# Patient Record
Sex: Male | Born: 1955
Health system: Southern US, Community
[De-identification: ages and names within clinical notes are randomized; demographics above are authoritative.]

## PROBLEM LIST (undated history)

## (undated) VITALS — BP 148/90 | HR 94 | Temp 98.0°F | Resp 17 | Ht 69.0 in | Wt 206.0 lb

## (undated) DIAGNOSIS — R21 Rash and other nonspecific skin eruption: Secondary | ICD-10-CM

## (undated) DIAGNOSIS — D72829 Elevated white blood cell count, unspecified: Secondary | ICD-10-CM

## (undated) DIAGNOSIS — E785 Hyperlipidemia, unspecified: Secondary | ICD-10-CM

## (undated) DIAGNOSIS — F329 Major depressive disorder, single episode, unspecified: Secondary | ICD-10-CM

## (undated) DIAGNOSIS — F32A Depression, unspecified: Secondary | ICD-10-CM

## (undated) DIAGNOSIS — G4700 Insomnia, unspecified: Secondary | ICD-10-CM

## (undated) DIAGNOSIS — F319 Bipolar disorder, unspecified: Secondary | ICD-10-CM

## (undated) DIAGNOSIS — K289 Gastrojejunal ulcer, unspecified as acute or chronic, without hemorrhage or perforation: Secondary | ICD-10-CM

## (undated) DIAGNOSIS — IMO0002 Reserved for concepts with insufficient information to code with codable children: Secondary | ICD-10-CM

## (undated) HISTORY — DX: Bipolar disorder, unspecified: F31.9

## (undated) HISTORY — PX: CERVICAL SPINE SURGERY: SHX589

## (undated) HISTORY — PX: BACK SURGERY: SHX140

## (undated) HISTORY — PX: UPPER GASTROINTESTINAL ENDOSCOPY: SHX188

## (undated) HISTORY — PX: CHOLECYSTECTOMY: SHX55

## (undated) HISTORY — DX: Insomnia, unspecified: G47.00

## (undated) HISTORY — DX: Hyperlipidemia, unspecified: E78.5

## (undated) HISTORY — DX: Gastrojejunal ulcer, unspecified as acute or chronic, without hemorrhage or perforation: K28.9

## (undated) HISTORY — DX: Reserved for concepts with insufficient information to code with codable children: IMO0002

## (undated) HISTORY — PX: LUMBAR SPINE SURGERY: SHX701

## (undated) HISTORY — DX: Elevated white blood cell count, unspecified: D72.829

## (undated) HISTORY — DX: Rash and other nonspecific skin eruption: R21

---

## 2000-02-21 ENCOUNTER — Encounter: Payer: Self-pay | Admitting: Neurosurgery

## 2000-02-23 ENCOUNTER — Encounter: Payer: Self-pay | Admitting: Neurosurgery

## 2000-02-23 ENCOUNTER — Inpatient Hospital Stay (HOSPITAL_COMMUNITY): Admission: RE | Admit: 2000-02-23 | Discharge: 2000-02-24 | Payer: Self-pay | Admitting: Neurosurgery

## 2002-06-16 ENCOUNTER — Encounter: Payer: Self-pay | Admitting: Neurosurgery

## 2002-06-16 ENCOUNTER — Encounter: Admission: RE | Admit: 2002-06-16 | Discharge: 2002-06-16 | Payer: Self-pay | Admitting: Neurosurgery

## 2002-06-29 ENCOUNTER — Encounter: Payer: Self-pay | Admitting: Neurosurgery

## 2002-06-29 ENCOUNTER — Encounter: Admission: RE | Admit: 2002-06-29 | Discharge: 2002-06-29 | Payer: Self-pay | Admitting: Neurosurgery

## 2002-08-12 ENCOUNTER — Encounter: Payer: Self-pay | Admitting: Neurosurgery

## 2002-08-14 ENCOUNTER — Inpatient Hospital Stay (HOSPITAL_COMMUNITY): Admission: RE | Admit: 2002-08-14 | Discharge: 2002-08-15 | Payer: Self-pay | Admitting: Neurosurgery

## 2002-08-14 ENCOUNTER — Encounter: Payer: Self-pay | Admitting: Neurosurgery

## 2003-01-12 ENCOUNTER — Encounter: Payer: Self-pay | Admitting: Neurosurgery

## 2003-01-12 ENCOUNTER — Inpatient Hospital Stay (HOSPITAL_COMMUNITY): Admission: RE | Admit: 2003-01-12 | Discharge: 2003-01-14 | Payer: Self-pay | Admitting: Neurosurgery

## 2003-05-06 ENCOUNTER — Inpatient Hospital Stay (HOSPITAL_COMMUNITY): Admission: RE | Admit: 2003-05-06 | Discharge: 2003-05-11 | Payer: Self-pay | Admitting: Neurosurgery

## 2003-05-06 ENCOUNTER — Encounter: Payer: Self-pay | Admitting: Neurosurgery

## 2003-12-12 ENCOUNTER — Emergency Department (HOSPITAL_COMMUNITY): Admission: EM | Admit: 2003-12-12 | Discharge: 2003-12-13 | Payer: Self-pay | Admitting: Family Medicine

## 2003-12-29 ENCOUNTER — Emergency Department (HOSPITAL_COMMUNITY): Admission: EM | Admit: 2003-12-29 | Discharge: 2003-12-29 | Payer: Self-pay | Admitting: Emergency Medicine

## 2003-12-31 ENCOUNTER — Ambulatory Visit (HOSPITAL_COMMUNITY): Admission: RE | Admit: 2003-12-31 | Discharge: 2003-12-31 | Payer: Self-pay | Admitting: Internal Medicine

## 2004-01-04 ENCOUNTER — Ambulatory Visit (HOSPITAL_COMMUNITY): Admission: RE | Admit: 2004-01-04 | Discharge: 2004-01-04 | Payer: Self-pay | Admitting: Internal Medicine

## 2004-01-08 ENCOUNTER — Inpatient Hospital Stay (HOSPITAL_COMMUNITY): Admission: RE | Admit: 2004-01-08 | Discharge: 2004-01-10 | Payer: Self-pay | Admitting: Internal Medicine

## 2004-01-24 ENCOUNTER — Ambulatory Visit (HOSPITAL_COMMUNITY): Admission: RE | Admit: 2004-01-24 | Discharge: 2004-01-24 | Payer: Self-pay | Admitting: Internal Medicine

## 2004-02-05 ENCOUNTER — Emergency Department (HOSPITAL_COMMUNITY): Admission: EM | Admit: 2004-02-05 | Discharge: 2004-02-05 | Payer: Self-pay | Admitting: *Deleted

## 2004-04-07 ENCOUNTER — Ambulatory Visit (HOSPITAL_COMMUNITY): Admission: RE | Admit: 2004-04-07 | Discharge: 2004-04-07 | Payer: Self-pay | Admitting: Internal Medicine

## 2004-09-14 ENCOUNTER — Ambulatory Visit: Payer: Self-pay | Admitting: Internal Medicine

## 2004-09-24 HISTORY — PX: COLONOSCOPY: SHX174

## 2005-01-18 ENCOUNTER — Ambulatory Visit: Payer: Self-pay | Admitting: Internal Medicine

## 2005-01-22 ENCOUNTER — Ambulatory Visit (HOSPITAL_COMMUNITY): Admission: RE | Admit: 2005-01-22 | Discharge: 2005-01-22 | Payer: Self-pay | Admitting: Internal Medicine

## 2005-02-19 ENCOUNTER — Emergency Department (HOSPITAL_COMMUNITY): Admission: EM | Admit: 2005-02-19 | Discharge: 2005-02-19 | Payer: Self-pay | Admitting: Emergency Medicine

## 2005-03-28 ENCOUNTER — Emergency Department (HOSPITAL_COMMUNITY): Admission: EM | Admit: 2005-03-28 | Discharge: 2005-03-28 | Payer: Self-pay | Admitting: Emergency Medicine

## 2005-06-15 ENCOUNTER — Ambulatory Visit: Payer: Self-pay | Admitting: Gastroenterology

## 2005-06-15 ENCOUNTER — Ambulatory Visit (HOSPITAL_COMMUNITY): Admission: RE | Admit: 2005-06-15 | Discharge: 2005-06-15 | Payer: Self-pay | Admitting: Internal Medicine

## 2005-06-21 ENCOUNTER — Ambulatory Visit (HOSPITAL_COMMUNITY): Admission: RE | Admit: 2005-06-21 | Discharge: 2005-06-21 | Payer: Self-pay | Admitting: Internal Medicine

## 2005-06-26 ENCOUNTER — Ambulatory Visit: Payer: Self-pay | Admitting: Internal Medicine

## 2005-08-22 ENCOUNTER — Ambulatory Visit (HOSPITAL_COMMUNITY): Admission: RE | Admit: 2005-08-22 | Discharge: 2005-08-22 | Payer: Self-pay | Admitting: General Surgery

## 2005-08-22 ENCOUNTER — Encounter (INDEPENDENT_AMBULATORY_CARE_PROVIDER_SITE_OTHER): Payer: Self-pay | Admitting: General Surgery

## 2005-11-21 ENCOUNTER — Ambulatory Visit: Payer: Self-pay | Admitting: Internal Medicine

## 2006-02-27 ENCOUNTER — Ambulatory Visit: Payer: Self-pay | Admitting: Internal Medicine

## 2006-03-29 ENCOUNTER — Ambulatory Visit (HOSPITAL_COMMUNITY): Admission: RE | Admit: 2006-03-29 | Discharge: 2006-03-29 | Payer: Self-pay | Admitting: Gastroenterology

## 2007-01-09 ENCOUNTER — Ambulatory Visit (HOSPITAL_COMMUNITY): Admission: RE | Admit: 2007-01-09 | Discharge: 2007-01-09 | Payer: Self-pay | Admitting: Gastroenterology

## 2007-06-19 ENCOUNTER — Ambulatory Visit: Payer: Self-pay | Admitting: Gastroenterology

## 2007-07-12 ENCOUNTER — Emergency Department (HOSPITAL_COMMUNITY): Admission: EM | Admit: 2007-07-12 | Discharge: 2007-07-12 | Payer: Self-pay | Admitting: Emergency Medicine

## 2007-11-04 ENCOUNTER — Ambulatory Visit: Payer: Self-pay | Admitting: Gastroenterology

## 2007-12-20 ENCOUNTER — Emergency Department (HOSPITAL_COMMUNITY): Admission: EM | Admit: 2007-12-20 | Discharge: 2007-12-20 | Payer: Self-pay | Admitting: Emergency Medicine

## 2008-02-04 ENCOUNTER — Ambulatory Visit: Payer: Self-pay | Admitting: Gastroenterology

## 2008-02-11 ENCOUNTER — Ambulatory Visit: Payer: Self-pay | Admitting: Gastroenterology

## 2008-02-11 ENCOUNTER — Ambulatory Visit (HOSPITAL_COMMUNITY): Admission: RE | Admit: 2008-02-11 | Discharge: 2008-02-11 | Payer: Self-pay | Admitting: Gastroenterology

## 2008-02-22 ENCOUNTER — Emergency Department (HOSPITAL_COMMUNITY): Admission: EM | Admit: 2008-02-22 | Discharge: 2008-02-22 | Payer: Self-pay | Admitting: Emergency Medicine

## 2008-11-22 ENCOUNTER — Emergency Department (HOSPITAL_COMMUNITY): Admission: EM | Admit: 2008-11-22 | Discharge: 2008-11-22 | Payer: Self-pay | Admitting: Emergency Medicine

## 2009-06-10 ENCOUNTER — Encounter (INDEPENDENT_AMBULATORY_CARE_PROVIDER_SITE_OTHER): Payer: Self-pay | Admitting: *Deleted

## 2009-06-10 LAB — CONVERTED CEMR LAB
CO2: 23 meq/L
Calcium: 9.3 mg/dL
Chloride: 106 meq/L
Creatinine, Ser: 1.02 mg/dL
Glucose, Bld: 83 mg/dL
Hemoglobin: 14.7 g/dL
WBC: 10.5 10*3/uL

## 2009-10-12 ENCOUNTER — Ambulatory Visit (HOSPITAL_COMMUNITY): Admission: RE | Admit: 2009-10-12 | Discharge: 2009-10-12 | Payer: Self-pay | Admitting: Neurology

## 2009-12-19 ENCOUNTER — Encounter (INDEPENDENT_AMBULATORY_CARE_PROVIDER_SITE_OTHER): Payer: Self-pay | Admitting: *Deleted

## 2009-12-19 ENCOUNTER — Inpatient Hospital Stay (HOSPITAL_COMMUNITY): Admission: EM | Admit: 2009-12-19 | Discharge: 2009-12-21 | Payer: Self-pay | Admitting: Emergency Medicine

## 2009-12-19 ENCOUNTER — Ambulatory Visit: Payer: Self-pay | Admitting: Cardiology

## 2009-12-19 LAB — CONVERTED CEMR LAB: Free T4: 1.28 ng/dL

## 2009-12-20 ENCOUNTER — Encounter (INDEPENDENT_AMBULATORY_CARE_PROVIDER_SITE_OTHER): Payer: Self-pay | Admitting: Internal Medicine

## 2009-12-29 ENCOUNTER — Encounter (HOSPITAL_COMMUNITY): Admission: RE | Admit: 2009-12-29 | Discharge: 2010-01-28 | Payer: Self-pay | Admitting: Cardiology

## 2009-12-29 ENCOUNTER — Ambulatory Visit: Payer: Self-pay | Admitting: Cardiology

## 2010-02-08 DIAGNOSIS — F411 Generalized anxiety disorder: Secondary | ICD-10-CM | POA: Insufficient documentation

## 2010-02-08 DIAGNOSIS — F325 Major depressive disorder, single episode, in full remission: Secondary | ICD-10-CM | POA: Insufficient documentation

## 2010-02-08 DIAGNOSIS — F329 Major depressive disorder, single episode, unspecified: Secondary | ICD-10-CM

## 2010-02-08 DIAGNOSIS — F410 Panic disorder [episodic paroxysmal anxiety] without agoraphobia: Secondary | ICD-10-CM | POA: Insufficient documentation

## 2010-02-08 DIAGNOSIS — Z720 Tobacco use: Secondary | ICD-10-CM

## 2010-02-08 DIAGNOSIS — IMO0002 Reserved for concepts with insufficient information to code with codable children: Secondary | ICD-10-CM

## 2010-02-08 DIAGNOSIS — Z87891 Personal history of nicotine dependence: Secondary | ICD-10-CM | POA: Insufficient documentation

## 2010-02-08 DIAGNOSIS — Z8719 Personal history of other diseases of the digestive system: Secondary | ICD-10-CM | POA: Insufficient documentation

## 2010-02-09 ENCOUNTER — Encounter (INDEPENDENT_AMBULATORY_CARE_PROVIDER_SITE_OTHER): Payer: Self-pay | Admitting: *Deleted

## 2010-02-10 ENCOUNTER — Ambulatory Visit: Payer: Self-pay | Admitting: Cardiology

## 2010-02-10 ENCOUNTER — Encounter (INDEPENDENT_AMBULATORY_CARE_PROVIDER_SITE_OTHER): Payer: Self-pay | Admitting: *Deleted

## 2010-06-13 ENCOUNTER — Encounter (INDEPENDENT_AMBULATORY_CARE_PROVIDER_SITE_OTHER): Payer: Self-pay | Admitting: *Deleted

## 2010-07-04 ENCOUNTER — Encounter: Payer: Self-pay | Admitting: Cardiology

## 2010-07-05 ENCOUNTER — Ambulatory Visit: Payer: Self-pay | Admitting: Cardiology

## 2010-07-05 DIAGNOSIS — E785 Hyperlipidemia, unspecified: Secondary | ICD-10-CM | POA: Insufficient documentation

## 2010-07-13 ENCOUNTER — Encounter (INDEPENDENT_AMBULATORY_CARE_PROVIDER_SITE_OTHER): Payer: Self-pay | Admitting: *Deleted

## 2010-07-13 LAB — CONVERTED CEMR LAB
AST: 23 units/L (ref 0–37)
Albumin: 4.1 g/dL (ref 3.5–5.2)
BUN: 8 mg/dL (ref 6–23)
Calcium: 9.3 mg/dL (ref 8.4–10.5)
Chloride: 104 meq/L (ref 96–112)
Glucose, Bld: 124 mg/dL — ABNORMAL HIGH (ref 70–99)
HDL: 30 mg/dL — ABNORMAL LOW (ref >39)
Potassium: 4.3 meq/L (ref 3.5–5.3)
Sodium: 139 meq/L (ref 135–145)
TSH: 1.237 microintl units/mL (ref 0.350–4.500)
Total Protein: 6.7 g/dL (ref 6.0–8.3)

## 2010-10-15 ENCOUNTER — Encounter (INDEPENDENT_AMBULATORY_CARE_PROVIDER_SITE_OTHER): Payer: Self-pay | Admitting: Internal Medicine

## 2010-10-24 NOTE — Assessment & Plan Note (Signed)
Summary: F4M   Visit Type:  Follow-up Primary Provider:  Dr. Sherryll Burger   History of Present Illness: Stephen Porter returns to the office for continuing assessment and treatment of hyperlipidemia.  Since his last visit, he has done generally well.  He has experienced no chest discomfort, but has chronic dyspnea on moderate exertion that is unchanged.  He has very gradually planned to discontinue cigarette smoking and is now in possession of patches with a quit date only a few days in the future.  His primary care physician advised him to discontinue the Niaspan, which I had started.   Current Medications (verified): 1)  Ultram 50 Mg Tabs (Tramadol Hcl) .... Use As Directed 2)  Pristiq 50 Mg Xr24h-Tab (Desvenlafaxine Succinate) .... Take 1 Tab Daily 3)  Simvastatin 20 Mg Tabs (Simvastatin) .... Take 1 Tab Daily 4)  Alprazolam 1 Mg Tabs (Alprazolam) .... 1/2 - 1 Tablet At Bedtime As Needed Sleep 5)  Niaspan 500 Mg Cr-Tabs (Niacin (Antihyperlipidemic)) .... Take One Tablet By Mouth Daily Titrating To 1500mg  Daily  Allergies (verified): No Known Drug Allergies  Comments:  Nurse/Medical Assistant: no meds no list reviewed previous ov med list patient states meds are correct  Past History:  PMH, FH, and Social History reviewed and updated.  Past Medical History: Chest pain-negative stress nuclear study in 4/11 TOBACCO ABUSE (ICD-305.1)-50 pack years; 1.5 packs per day Hyperlipidemia-moderately elevated triglycerides; low HDL; elevated non-HDL cholesterol ANXIETY & DEPRESSION (ICD-311) Insomnia DEGENERATIVE DISC DISEASE (ICD-722.6) History of jejunal ulceration  Past Surgical History: Lumbosacral spine surgery C-Spine surgery x3 Cholecystectomy Colonoscopy-2006  Review of Systems       See HPI.  Vital Signs:  Patient profile:   55 year old male Weight:      206 pounds BMI:     29.66 Pulse rate:   80 / minute BP sitting:   137 / 84  (right arm)  Vitals Entered By: Dreama Saa, CNA (July 05, 2010 1:41 PM)  Physical Exam  General:  somewhat tense; well developed; no acute distress:   Neck-No JVD; no carotid bruits: Lungs-No tachypnea, no rales; no rhonchi; no wheezes: Cardiovascular-normal PMI; normal S1 and S2: Abdomen-BS normal; soft and non-tender without masses or organomegaly:  Musculoskeletal-No deformities, no cyanosis or clubbing: Skin-Warm, no significant lesions: Extremities-Nl distal pulses; no edema:     Impression & Recommendations:  Problem # 1:  HYPERLIPIDEMIA (ICD-272.4) Lipid profile remains markedly suboptimal with non-HDL cholesterol in excess of 230.  I doubt that 40 mg of simvastatin will be adequate-atorvastatin or rosuvastatin should be substituted at their highest doses.  He will also require a triglyceride lowering agent, either niacin or fish oil since fenofibrate was ineffective.  I will leave management of this problem to Dr. Sherryll Burger.  CHOL: 279 (07/04/2010)   HDL: 30 (07/04/2010)   TG: 729 (07/04/2010)  TSH-1.2  Hepatic profile-normal  Problem # 2:  TOBACCO ABUSE (ICD-305.1) Patient congratulated on progress to date and advised that failure is extremely common.  He is encouraged to make multiple attempts if necessary and to contact us for referral to a formal program if his own efforts proved fruitless.  No ongoing cardiology care is necessarily warranted, although I would be happy to manage hyperlipidemia if Dr. Sherryll Burger so desires.  Unless I hear from him or from the patient, I will be available to see Stephen Porter in the future at any time that I can provide assistance.  Other Orders: Future Orders: T-Lipid Profile ([id number]) ... 08/07/2010  Patient Instructions: 1)  Your physician recommends that you schedule a follow-up appointment in: as needed  2)  Your physician recommends that you continue on your current medications as directed. Please refer to the Current Medication list given to you  today. Prescriptions: LIPITOR 40 MG TABS (ATORVASTATIN CALCIUM) Take 1 tablet by mouth once a day  #30 x 3   Entered by:   Teressa Lower RN   Authorized by:   Kathlen Brunswick, MD, [facility]   Signed by:   Teressa Lower RN on 07/13/2010   Method used:   Electronically to        [facility]* (retail)       [address]       [place], [place]  [address]       Ph: [phone]       Fax: [phone]   RxID:   [id number]

## 2010-10-24 NOTE — Letter (Signed)
Summary:  [facility] Work [facility], agricultural at [employer]  [address], [place] [address]   Phone: [phone]  Fax: [phone]     Feb 10, 2010 MRN: [id number]   [facility] [address] [place], [place]  [address]      YOUR LAB WORK IS DUE  April 12, 2010 _________________________________________  Please go to [facility], located across the street from [facility] on the second floor.  Hours are Monday - Friday 7am until 7:30pm         Saturday 8am until 12noon    _x_  DO NOT EAT OR DRINK AFTER MIDNIGHT EVENING PRIOR TO LABWORK  __ YOUR LABWORK IS NOT FASTING --YOU MAY EAT PRIOR TO LABWORK

## 2010-10-24 NOTE — Assessment & Plan Note (Signed)
Summary: 6 wk f/u per checkout on 4/7/myoview/tg   Visit Type:  Follow-up Primary Provider:  Dr. Sherryll Burger   History of Present Illness: Stephen Porter is seen in the office today after being referred to [place] for stress testing.  He was admitted to [facility] with presyncope and chest discomfort and cared for by the hospitalist.  I first met him in the stress lab, where a stress nuclear study was negative.  He has multiple cardiovascular risk factors, most notably hyperlipidemia and cigarette smoking.  He has never discontinued tobacco use for any significant period of time.  Chest discomfort and lightheadedness have resolved.  He is disabled, but performs some yard work without cardiopulmonary symptoms.  Patient reports insomnia over the past 6 months.  He denies anxiety or depression.  He received a brief prescription for Ambien with benefit.  Current Medications (verified): 1)  Ultram 50 Mg Tabs (Tramadol Hcl) .... Use As Directed 2)  Pristiq 50 Mg Xr24h-Tab (Desvenlafaxine Succinate) .... Take 1 Tab Daily 3)  Simvastatin 20 Mg Tabs (Simvastatin) .... Take 1 Tab Daily 4)  Alprazolam 1 Mg Tabs (Alprazolam) .... 1/2 - 1 Tablet At Bedtime As Needed Sleep 5)  Niaspan 500 Mg Cr-Tabs (Niacin (Antihyperlipidemic)) .... Take One Tablet By Mouth Daily Titrating To 1500mg  Daily  Allergies (verified): No Known Drug Allergies  Past History:  Family History: Last updated: 02/10/2010 Father and mother are alive and well Siblings-3 brothers and one sister have hypertension  Social History: Last updated: 02/10/2010 Married and lives locally; 3 children Employment-disabled Tobacco Use - 50 pack years; 1.5 packs per day Alcohol Use - no Regular Exercise - no Drug Use - no  Past Medical History: Chest pain-negative stress nuclear study in 4/11 TOBACCO ABUSE (ICD-305.1)-50 pack years; 1.5 packs per day DYSLIPIDEMIA (ICD-272.4)-elevated triglycerides ANXIETY & DEPRESSION  (ICD-311) Insomnia DEGENERATIVE DISC DISEASE (ICD-722.6) History of jejunal ulceration  Past Surgical History: Lumbosacral spine surgery C-Spine surgery x3 Cholecystectomy  Family History: Father and mother are alive and well Siblings-3 brothers and one sister have hypertension  Social History: Married and lives locally; 3 children Employment-disabled Tobacco Use - 50 pack years; 1.5 packs per day Alcohol Use - no Regular Exercise - no Drug Use - no  -  Date:  12/20/2009    Cholesterol: 216    HDL: 23    Triglycerides: 478   Review of Systems  The patient denies anorexia, fever, weight loss, weight gain, vision loss, decreased hearing, hoarseness, chest pain, syncope, dyspnea on exertion, peripheral edema, prolonged cough, headaches, hemoptysis, abdominal pain, melena, and hematochezia.    Vital Signs:  Patient profile:   55 year old male Height:      70 inches Weight:      206 pounds BMI:     29.66 Pulse rate:   84 / minute BP sitting:   115 / 63  (right arm)  Vitals Entered By: Dreama Saa, CNA (Feb 10, 2010 1:53 PM)  Physical Exam  General:    Anxious; well developed; no acute distress:   Neck-No JVD; no carotid bruits: Lungs-No tachypnea, no rales; no rhonchi; no wheezes: Cardiovascular-normal PMI; normal S1 and S2: Abdomen-BS normal; soft and non-tender without masses or organomegaly:  Musculoskeletal-No deformities, no cyanosis or clubbing: Neurologic-Normal cranial nerves; symmetric strength and tone:  Skin-Warm, no significant lesions: Extremities-Nl distal pulses; no edema:     Impression & Recommendations:  Problem # 1:  TOBACCO ABUSE (ICD-305.1) Patient reported benefit with transdermal nicotine provided to  him in the hospital.  I recommended that he obtain 21 mg patches and attempt to discontinue cigarette smoking.  He has experienced a previous adverse affect to Chantix.  If he is unsuccessful, we will refer him to a smoking cessation  program.  Problem # 2:  DYSLIPIDEMIA (ICD-272.4) He has quite high triglycerides and low HDL without a diagnosis of diabetes.  This also could reflect hypothyroidism-TSH level will be obtained.  Niaspan will be added to his regime and a repeat lipid profile obtained in 2 months.  Fenofibrate will be discontinued for the time being.  I will plan to reassess this nice gentleman in 4 months.  At that point, I anticipate that he will not require subsequent cardiology treatment unless new issues arise.  Other Orders: Future Orders: T-Comprehensive Metabolic Panel ([id number]) ... 04/12/2010 T-Lipid Profile [id number]) ... 04/12/2010 T-TSH [phone]) ... 04/12/2010  Patient Instructions: 1)  Your physician recommends that you schedule a follow-up appointment in: 4 months 2)  Your physician recommends that you return for lab work in: 2 months 3)  Your physician has recommended you make the following change in your medication:  stop tricor, xanax 1mg  1/2-1 tablet at bedtime for sleep as needed 4)  niaspan 500mg  daily titrate to 1500mg  daily Prescriptions: NIASPAN 500 MG CR-TABS (NIACIN (ANTIHYPERLIPIDEMIC)) take one tablet by mouth daily titrating to 1500mg  daily  #90 x 3   Entered by:   Teressa Lower RN   Authorized by:   Kathlen Brunswick, MD, [facility]   Signed by:   Teressa Lower RN on 02/10/2010   Method used:   Electronically to        [facility]. [phone]* (retail)       [address] [place], [place]  [address]       Ph: [phone]       Fax: [phone]   RxID:   [id number] ALPRAZOLAM 1 MG TABS (ALPRAZOLAM) 1/2 - 1 tablet at bedtime as needed sleep  #30 x 0   Entered by:   Teressa Lower RN   Authorized by:   Kathlen Brunswick, MD, [facility]   Signed by:   Teressa Lower RN on 02/10/2010   Method used:   Print then Give to Patient   RxID:   [id number]

## 2010-10-24 NOTE — Miscellaneous (Signed)
Summary: LABS TSH,T4,12/19/2009  Clinical Lists Changes  Observations: Added new observation of TSH: 1.355 microintl units/mL (12/19/2009 10:40) Added new observation of T4, FREE: 1.28 ng/dL (04/54/0981 19:14)

## 2010-10-24 NOTE — Letter (Signed)
Summary: [place] [facility] Work [facility], agricultural at [employer]  [address], [place] [address]   Phone: [phone]  Fax: [phone]     July 13, 2010 MRN: [id number]   [facility] [address] [place], [place]  [address]      YOUR LAB WORK IS DUE   August 07, 2010  Please go to [facility], located across the street from [facility] on the second floor.  Hours are Monday - Friday 7am until 7:30pm         Saturday 8am until 12noon    _X_  DO NOT EAT OR DRINK AFTER MIDNIGHT EVENING PRIOR TO LABWORK

## 2010-10-24 NOTE — Miscellaneous (Signed)
Summary: LABS CBCD,BMP,TSH,06/10/2009  Clinical Lists Changes  Observations: Added new observation of CALCIUM: 9.3 mg/dL (04/54/0981 1:91) Added new observation of CREATININE: 1.02 mg/dL (47/82/9562 1:30) Added new observation of BUN: 11 mg/dL (86/57/8469 6:29) Added new observation of BG RANDOM: 83 mg/dL (52/84/1324 4:01) Added new observation of CO2 PLSM/SER: 23 meq/L (06/10/2009 8:49) Added new observation of CL SERUM: 106 meq/L (06/10/2009 8:49) Added new observation of K SERUM: 4.2 meq/L (06/10/2009 8:49) Added new observation of NA: 139 meq/L (06/10/2009 8:49) Added new observation of PLATELETK/UL: 252 K/uL (06/10/2009 8:49) Added new observation of MCV: 89.2 fL (06/10/2009 8:49) Added new observation of HCT: 44.5 % (06/10/2009 8:49) Added new observation of HGB: 14.7 g/dL (02/72/5366 4:40) Added new observation of WBC COUNT: 10.5 10*3/microliter (06/10/2009 8:49) Added new observation of TSH: 0.798 microintl units/mL (06/10/2009 8:49)

## 2010-12-17 LAB — CK TOTAL AND CKMB (NOT AT ARMC): Relative Index: INVALID (ref 0.0–2.5)

## 2010-12-17 LAB — BASIC METABOLIC PANEL
BUN: 7 mg/dL (ref 6–23)
Calcium: 8.7 mg/dL (ref 8.4–10.5)
Chloride: 102 mEq/L (ref 96–112)
GFR calc Af Amer: >60 mL/min (ref >60)
GFR calc Af Amer: >60 mL/min (ref >60)
GFR calc non Af Amer: >60 mL/min (ref >60)
GFR calc non Af Amer: >60 mL/min (ref >60)
Potassium: 3.8 mEq/L (ref 3.5–5.1)
Sodium: 132 mEq/L — ABNORMAL LOW (ref 135–145)
Sodium: 141 mEq/L (ref 135–145)

## 2010-12-17 LAB — DIFFERENTIAL
Eosinophils Absolute: 0.2 10*3/uL (ref 0.0–0.7)
Eosinophils Relative: 2 % (ref 0–5)
Lymphocytes Relative: 24 % (ref 12–46)
Lymphs Abs: 2.3 10*3/uL (ref 0.7–4.0)
Lymphs Abs: 2.6 10*3/uL (ref 0.7–4.0)
Monocytes Absolute: 0.6 10*3/uL (ref 0.1–1.0)
Monocytes Absolute: 0.8 10*3/uL (ref 0.1–1.0)
Monocytes Relative: 7 % (ref 3–12)
Monocytes Relative: 8 % (ref 3–12)
Neutro Abs: 5 10*3/uL (ref 1.7–7.7)
Neutrophils Relative %: 61 % (ref 43–77)

## 2010-12-17 LAB — COMPREHENSIVE METABOLIC PANEL
ALT: 29 U/L (ref 0–53)
Albumin: 3.1 g/dL — ABNORMAL LOW (ref 3.5–5.2)
BUN: 9 mg/dL (ref 6–23)
Calcium: 8.7 mg/dL (ref 8.4–10.5)
Glucose, Bld: 113 mg/dL — ABNORMAL HIGH (ref 70–99)
Sodium: 137 mEq/L (ref 135–145)
Total Protein: 5.9 g/dL — ABNORMAL LOW (ref 6.0–8.3)

## 2010-12-17 LAB — LIPID PANEL: VLDL: UNDETERMINED mg/dL (ref 0–40)

## 2010-12-17 LAB — CARDIAC PANEL(CRET KIN+CKTOT+MB+TROPI)
CK, MB: 1.1 ng/mL (ref 0.3–4.0)
CK, MB: 1.1 ng/mL (ref 0.3–4.0)
Relative Index: INVALID (ref 0.0–2.5)
Relative Index: INVALID (ref 0.0–2.5)
Total CK: 36 U/L (ref 7–232)
Total CK: 45 U/L (ref 7–232)
Total CK: 47 U/L (ref 7–232)

## 2010-12-17 LAB — URINALYSIS, ROUTINE W REFLEX MICROSCOPIC
Glucose, UA: NEGATIVE mg/dL
Ketones, ur: NEGATIVE mg/dL
Nitrite: NEGATIVE
Protein, ur: NEGATIVE mg/dL
pH: 5.5 (ref 5.0–8.0)

## 2010-12-17 LAB — CBC
HCT: 41 % (ref 39.0–52.0)
HCT: 47.8 % (ref 39.0–52.0)
Hemoglobin: 14.4 g/dL (ref 13.0–17.0)
Hemoglobin: 16.6 g/dL (ref 13.0–17.0)
MCV: 87.6 fL (ref 78.0–100.0)
Platelets: 180 10*3/uL (ref 150–400)
RBC: 4.7 MIL/uL (ref 4.22–5.81)
RBC: 5.46 MIL/uL (ref 4.22–5.81)
WBC: 11 10*3/uL — ABNORMAL HIGH (ref 4.0–10.5)
WBC: 8.2 10*3/uL (ref 4.0–10.5)

## 2010-12-17 LAB — T4, FREE: Free T4: 1.28 ng/dL (ref 0.80–1.80)

## 2010-12-17 LAB — HEPATIC FUNCTION PANEL
Albumin: 3.3 g/dL — ABNORMAL LOW (ref 3.5–5.2)
Bilirubin, Direct: 0.1 mg/dL (ref 0.0–0.3)
Total Bilirubin: 0.6 mg/dL (ref 0.3–1.2)

## 2010-12-17 LAB — POCT CARDIAC MARKERS
Myoglobin, poc: 62.7 ng/mL (ref 12–200)
Troponin i, poc: <0.05 ng/mL (ref 0.00–0.09)

## 2010-12-17 LAB — URINE CULTURE

## 2010-12-17 LAB — TSH: TSH: 1.355 u[IU]/mL (ref 0.350–4.500)

## 2010-12-17 LAB — ROCKY MTN SPOTTED FVR AB, IGG-BLOOD: RMSF IgG: 1.17 IV — ABNORMAL HIGH

## 2010-12-17 LAB — TROPONIN I: Troponin I: 0.01 ng/mL (ref 0.00–0.06)

## 2010-12-17 LAB — ROCKY MTN SPOTTED FVR AB, IGM-BLOOD: RMSF IgM: 0.2 IV (ref 0.00–0.89)

## 2011-01-04 LAB — CBC
HCT: 42.6 % (ref 39.0–52.0)
Hemoglobin: 14.8 g/dL (ref 13.0–17.0)
Platelets: 192 10*3/uL (ref 150–400)
RBC: 4.78 MIL/uL (ref 4.22–5.81)
WBC: 10 10*3/uL (ref 4.0–10.5)

## 2011-01-04 LAB — DIFFERENTIAL
Eosinophils Relative: 1 % (ref 0–5)
Lymphocytes Relative: 22 % (ref 12–46)
Lymphs Abs: 2.2 10*3/uL (ref 0.7–4.0)
Monocytes Absolute: 0.8 10*3/uL (ref 0.1–1.0)

## 2011-01-04 LAB — BASIC METABOLIC PANEL
GFR calc Af Amer: >60 mL/min (ref >60)
GFR calc non Af Amer: >60 mL/min (ref >60)
Potassium: 3.7 mEq/L (ref 3.5–5.1)
Sodium: 138 mEq/L (ref 135–145)

## 2011-02-06 NOTE — Op Note (Signed)
[name], Stephen Porter               ACCOUNT NO.:  [id number]   MEDICAL RECORD NO.:  [id number]          PATIENT TYPE:  AMB   LOCATION:  DAY                           FACILITY:  [facility]   PHYSICIAN:  Kassie Mends, M.D.      DATE OF BIRTH:  1956-03-04   DATE OF PROCEDURE:  02/11/2008  DATE OF DISCHARGE:                                PROCEDURE NOTE   PROCEDURE:  Hydrogen breath test.   INDICATION FOR EXAM:  [name] is a 55 year old male who presented with  complaint of diarrhea.  He believes he had Crohn disease in the past for  which the diagnosis was questionable due to negative IBD serology.  He  has an ulcer in his small bowel, which was likely secondary to anti-  inflammatory drugs.  He was chronically on narcotics and is currently  being weaned off.  He presented for his hydrogen breath test.   PROCEDURE TECHNIQUE:  Preprocedure questions were answered.  He had no  allergies to any medication.  He was given lactulose 25 g at 0730.  The  breath test was started at 0800.   FINDINGS:  [name] had 5 parts per million at 5 minutes.  He had a  peak of 27 parts per million at 75 minutes.  He had a slight trough of  25 parts per million at 90 minutes.  At approximately 105 minutes, he  had what looked like a second peak with 27 parts per million.  However,  [name] left the testing area at around 10:30 a.m.  He stated he  couldn't stay any longer and he left.  He was encouraged to stay, but he  would not.   RECOMMENDATIONS:  The patient should continue his current management for  his diarrhea.  Will see him in followup in July 2009.  Could consider  adding Questran.      Kassie Mends, M.D.  Electronically Signed     SM/MEDQ  D:  02/11/2008  T:  02/12/2008  Job:  [id number]   cc:   Kirstie Peri, MD  Fax: [phone]

## 2011-02-06 NOTE — Assessment & Plan Note (Signed)
NAME:  Stephen, Porter                CHART#:  [id number]   DATE:  06/19/2007                       DOB:  1956-04-11   REFERRING PHYSICIAN:  Kirstie Peri, MD.   PROBLEM LIST:  1. Jejunal ulcers and inflammation secondary to presumed Crohn's      disease.  2. Chronic low back pain.  3. Tobacco abuse.   SUBJECTIVE:  Stephen Porter is a 55 year old male who presents for return  patient visit.  He presented with abdominal pain in 2005 and borborygmi.  He had a sed rate of 25.  He had multiple studies prior to a CT scan in  2005, which revealed thickened segment of mid small bowel.  A small-  bowel follow-through in April of 2005 showed several loops of jejunum  with mildly irregular fold thickening.  He was admitted to the hospital  in April 2005 with severe abdominal pain unresponsive to Flagyl.  He was  started on IV steroids.  His ileocolonoscopy showed no evidence of  ulcers or erosions.  Biopsies of the terminal ileum showed benign  fragments of small intestinal mucosa.  CT angiogram showed no evidence  of stenosis of the celiac axis or the SMA.  Capsule endoscopy in April  of 2005 showed multiple small-bowel ulcers, primarily located in the  distal segment with noncritical narrowing.  He was 180 pounds at the  time.   He was discharged on a steroid taper.  He had an IBD first step, which  checked for anti-OMPC IgA, ASCA IgA, ASCA IgG, and p-ANCA.  The markers  were not detected.  He presented, again, in May 2005 with abdominal  pain.  He was being considered for diagnostic laparotomy or double  balloon enteroscopy.  His KUB showed early partial small-bowel  obstruction with dilated loops of jejunum in the left upper quadrant.  He had a repeat small-bowel study in July 2007 which showed mid segment  of the small bowel with mucosal information and narrowing.  He was  started on Pentasa 1 g four times a day, and an attempt was made to  taper his steroids.  He had a persistent complaint  of abdominal pain.  He was tried on Bentyl for his pain.  He also was taking MiraLax in 2005  for constipation.  He declined a visit to [facility] for double balloon enteroscopy and biopsy.  As of October 2005,  he had symptomatic improvement on steroid therapy and mesalamine.  He  was noted to have diarrhea and nocturnal fecal incontinence.  Dr. Karilyn Cota  recommended Bentyl and Benefiber.  He also requested a stool diary.  He  presented in September 2006 with abdominal pain and nausea.  He also  continued to complain of constipation and chronic back and neck pain.  His CT scan showed a large amount of stool in his colon.  His sed rate  was 14.  A small-bowel follow-through was normal.  Dr. Karilyn Cota  recommended that a taper off of his narcotics.  Because of his  persistent complaint of right upper quadrant pain, nausea and bloating,  and fatty food intolerance.  He was referred to Dr. Lovell Sheehan after a HIDA  scan revealed chronic cholecystitis with a normal gallbladder ejection  fraction, but reproducible symptoms with CCK injection.  He had no  gallstones  on ultrasound.  In November 2006 he had a laparoscopic  cholecystectomy.  He also was started on Levsin as needed for abdominal  cramps. He was seen and evaluated, again, in 2007 complaining of  abdominal pain.  Dr. Karilyn Cota ordered a CT scan of the abdomen and pelvis,  but the patient declined to have the procedure performed, and wanted his  records transferred to Dr. Vida Rigger for subsequent care.  He has not  been seen in the office since June 2007.   He presents, today, complaining of pain that varies from 1/10 to 10/10  that lasts approximately a week and is constant.  He tried Bentyl, but  it did not help.  He just has pain and bloating.  Sometimes he feels  like  he swallowed a Librarian, academic.  During these times he has a  decreased appetite and nausea, which comes and goes.  He denies any  vomiting.  His pain does  not wax-and-wane.  He may have problems with  constipation.  He has been told in the past, but it is secondary to his  narcotics.  He denies any diarrhea or fever.  He has subjective night  sweats.  He also has been tried on [employer] which he could not tell  any benefit.  He has steroids 3x this year, prescribe at Dr. Sherryll Burger.  His  last time was in July.  He denies any anti-inflammatory drug use.  He  tried going without his Pentasa for up to a year, but then he had return  of abdominal pain, and so he has returned to taking Pentasa 4 tablets  twice a day for the last three to four months.  He says that milk causes  bloating.  He does eat cheese.   PAST MEDICAL HISTORY:  1. Crohn's disease.  2. Low back pain.   PAST SURGICAL HISTORY:  1. Cholecystectomy secondary to abdominal pain.  2. Three neck surgeries.  3. Back surgery.   ALLERGIES:  No known drug allergies.   MEDICATIONS:  1. Lexapro 20 mg daily.  2. Pentasa 4 twice daily.  3. Diazepam 5 mg one to two daily.  4. Endocet as needed.  5. OxyContin 20 mg twice daily.   FAMILY HISTORY:  He has an uncle with Crohn's disease.   SOCIAL HISTORY:  He continues to smoke a pack a day.  He is married to  one of the nurses that works on the second floor at [employer].   REVIEW OF SYSTEMS:  Stephen Porter last capsule endoscopy was in July 2007  and it was normal. As per the HPI, otherwise all systems are negative.   PHYSICAL EXAMINATION:  VITAL SIGNS:  Weight 205 pounds (about 13 pounds  since April 2007).  Height 5 feet 10 inches.  BMI 29.4 (overweight).  Temperature 98, blood pressure 118/80, pulse 80.GENERAL:  He is in no  apparent distress, alert and oriented x4. HEENT:  Exam is atraumatic,  normocephalic.  Pupils equal, and react to light.  Mouth no oral  lesions.  Posterior pharynx without erythema or exudate.NECK:  Full  range of motion and no lymphadenopathy.LUNGS:  Clear to auscultation  bilaterally  CARDIOVASCULAR:  Regular  rhythm, no murmur, normal S1 and S2.ABDOMEN:  Bowel sounds present, soft, mild tenderness to palpation in the right  lower quadrant without rebound or guarding.  No abdominal  bruits.EXTREMITIES:  Without cyanosis clubbing or edema.SKIN:  He has no  pretibial lesions.   ASSESSMENT:  Stephen Porter is  a 55 year old male with presumed jejunal  Crohn's disease.  He has no evidence of active disease today.  He has  intermittent abdominal pain, may be secondary to intermittent small  bowel obstruction, stricture, irritable bowel, or decreased motility  secondary to narcotics causing small-bowel bacterial overgrowth.   Thank you for allowing me to see Stephen Porter in consultation.  My  recommendations follow.   RECOMMENDATIONS:  1. Will add a Probiotic.  He is given seven samples of Alive and asked      to use a Probiotic until his return patient visit in 4 months.  2. He may continue his Pentasa.  3. He is asked to avoid foods that cause both bloating, fluctuance,      and gas.  4. We will obtain records from Dr. Ewing Schlein.       Kassie Mends, M.D.  Electronically Signed     SM/MEDQ  D:  06/19/2007  T:  06/19/2007  Job:  [id number]   cc:   Kirstie Peri, MD

## 2011-02-06 NOTE — Assessment & Plan Note (Signed)
NAMERON, BESKE                CHART#:  [id number]   DATE:  02/04/2008                       DOB:  10/26/1955   PROBLEM LIST:  1. Jejunal ulcers and inflammation in 2005 which are most likely not      Crohn's disease.  2. Chronic low back pain.  3. Tobacco abuse.  4. Cholecystectomy.   SUBJECTIVE:  Mr. Coye is a 55 year old male who presents as a return  patient visit.  He was last seen in February 2009.  He has been  complaining of diarrhea for over 2 weeks.  He was hospitalized recently.  He stopped his pain medicines as well.  He complains of pain in his  lower abdomen and bloating.  His pain really is migratory.  Sometimes it  is in the right upper quadrant, sometimes in the left upper quadrant,  sometimes lower.  He complains of having yellow stool with mucus.  He  denies any black tarry stools or blood in his stool.  He he has nausea  but no vomiting.  He has less than 10 bowel movements a day.  He usually  has 5 to 6 bowel movements a day which are medium to small volume.  He  has had no formed stool for 2 weeks.  He denies any fever.  He has had  subjective chills while taking himself off the narcotics.  He is now  only on probiotics.  He did have some hyoscyamine that was over a year  old; he took that, and it helped his symptoms.  Occasionally, he eats  cheese.  He denies any milk or ice cream use. His narcotics have all  been discontinued.   MEDICATIONS:  Probiotic daily.   OBJECTIVE/PHYSICAL EXAMINATION:  VITAL SIGNS:  Weight 197 pounds (down  13 pounds since is November 02, 2007), height 5 feet 10 inches,  temperature 98, blood pressure 112/80, pulse 80, BMI 28.3 (overweight).  GENERAL:  He is in no apparent distress.  Alert and oriented x4.  He  appears somewhat agitated.  LUNGS:  Clear to auscultation bilaterally.  CARDIOVASCULAR:  Regular rhythm.  No murmurs.  ABDOMEN:  Bowel sounds  are present, soft, mild tenderness to palpation in the left upper  quadrant  without rebound or guarding.  Otherwise, it is a nontender  exam, mild distention.  NEURO:  He has no focal neurologic deficits.   ASSESSMENT:  Mr. Mcenery is a 55 year old male who has a history of  jejunal ulcers which were being treated presumptively with Pentasa which  has now been discontinued.  He has had inflammatory bowel disease  markers which have been negative.  He now presents with diarrhea  whereas, previously, he had constipation.  I believe his constipation  was probably related to narcotic use, and now he has irritable bowel  syndrome, diarrhea predominant.  The differential diagnosis includes  lactose intolerance as well as small bowel bacterial overgrowth due to  his long-term use of high dose narcotics.  He could have bile salt-  induced diarrhea and a low likelihood of a thyroid disturbance  contributing to his symptoms.  Thank you for allowing me to see Mr.  Samaan in consultation.  My recommendations follow.   RECOMMENDATIONS:  1. We will check a CMP, a lipase, C-reactive protein and TSH today.  2. He is  asked to avoid dairy products.  He is given a handout on      lactose-free diet.  His discharge instructions were given to him in      writing.  3. Will add Bentyl 30 minutes prior to breakfast, and he may repeat      this up to three times a day.  He is cautioned that it may cause      drowsiness, dry eyes, dry mouth, blurry vision or urinary      retention.  4. He will be scheduled for hydrogen breath test.  It the hydrogen      breath test shows no evidence of bacterial overgrowth, then I would      consider adding Questran if Bentyl and a lactose-free diet do not      improve his symptoms.  5. He has a followup appointment to see me in 2 months.       Kassie Mends, M.D.  Electronically Signed     SM/MEDQ  D:  02/04/2008  T:  02/04/2008  Job:  [id number]   cc:   Kirstie Peri, MD

## 2011-02-06 NOTE — Assessment & Plan Note (Signed)
NAME:  Stephen Porter, Stephen Porter                CHART#:  [id number]   DATE:  11/04/2007                       DOB:  31-Jan-1956   REFERRING PHYSICIAN:  Kirstie Peri, MD   PROBLEM LIST:  1. Jejunal ulcers and inflammation in 2005, likely not secondary to      Crohn's disease.  2. Chronic low back pain.  3. Tobacco abuse.   SUBJECTIVE:  Mr. Stephen Porter is a 55 year old male who is seen as a return  patient visit.  I reviewed records from Dr. Ewing Porter.  I agree that Mr.  Stephen Porter' abdominal pain is likely functional abdominal pain.  He had a  capsule endoscopy in 2007, which was normal.  He has been doing fairly  well.  He complains of intermittent constipation.  If he waits to have a  bowel movement every 3 days, then he gets a hard abdomen and pain and  discomfort.  Magnesium citrate relieves the symptoms but is harsh.  Enemas do not work.  He is still taking his probiotics.  He had a bad  episode of feeling like acid splashed up into his mouth and was placed  no steroids and antibiotics by Dr. Sherryll Porter and sort of had a soughing of  his oral cavity.  A strep test was negative.   MEDICATIONS:  1. Diazepam.  2. Endocet.  3. OxyContin 20 mg 2-4 times a day.  4. Probiotics daily.  5. Lexapro.   OBJECTIVE:  Weight 210 pounds (up 5 pounds since September 2008), height  5 feet 10 inches.  Temperature is 98.2, blood pressure 120/78, pulse  76.GENERAL:  He is in no acute distress, alert and oriented x4. HEENT:  Atraumatic, normocephalic.  Pupils equal and reactive to light.  Mouth:  No oral lesions.  Posterior pharynx without erythema or exudate.LUNGS:  Clear to auscultation bilaterally.CARDIOVASCULAR:  A regular rhythm with  no murmur, normal S1 and S2.ABDOMEN:  Bowel sounds are present, soft,  nontender, nondistended.  No rebound or guarding.EXTREMITIES:  No  cyanosis or edema.  Shins:  No pretibial lesions.   ASSESSMENT:  Mr. Stephen Porter is a 55 year old male who has irritable bowel  syndrome, likely  constipation-predominant.  His symptoms could be more  ideally controlled.  His gastroesophageal reflux disease is  intermittent.   Thank you for allowing me to see Mr. Stephen Porter in consultation.  My  recommendations follow.   RECOMMENDATIONS:  Mr. Stephen Porter is given his discharge instructions in  writing.  He is to drink 6-8 cups of water or juice daily.  He should  try a high-fiber diet.  He should try different items and avoid those  that cause bloating.  He should add MiraLax 17 g daily to achieve a  bowel movement once every 1-2 days.  He should continue his probiotics.  He should return to see me in 8 weeks.  He is to call me after 1 month  if this regimen does not satisfactorily treat his constipation and we  will add Amitiza.       Stephen Porter, M.D.  Electronically Signed     SM/MEDQ  D:  11/04/2007  T:  11/06/2007  Job:  [id number]   cc:   Kirstie Peri, MD

## 2011-02-09 NOTE — Discharge Summary (Signed)
   Stephen Porter, Stephen Porter                         ACCOUNT NO.:  [id number]   MEDICAL RECORD NO.:  [id number]                   PATIENT TYPE:  INP   LOCATION:  3012                                 FACILITY:  [facility]   PHYSICIAN:  Payton Doughty, M.D.                   DATE OF BIRTH:  1956-06-17   DATE OF ADMISSION:  05/06/2003  DATE OF DISCHARGE:  05/11/2003                                 DISCHARGE SUMMARY   ADMISSION DIAGNOSIS:  L5-S1 foraminal disk.   DISCHARGE DIAGNOSIS:  L5-S1 foraminal disk.   PROCEDURE:  L5-S1 laminectomy, diskectomy, posterior lumbar interbody fusion  with intertransverse arthrodesis.   COMPLICATIONS:  None.   BODY OF TEXT:  This is a 55 year old right-handed white gentleman whose  history and physical is recounted in the chart.  He has had anterior  cervicals done, and then developed back and left leg pain which is  intractable.  On MRI shows a broad based foraminal disk with extension left  L5-S1 neural foramen, and he is admitted for fusion.   MEDICAL ISSUES:  Unremarkable.   PHYSICAL EXAMINATION:  GENERAL:  Intact.  NEUROLOGIC:  Demonstrated a little bit of dorsiflexion weakness on the left  side.  Positive straight leg raise.   HOSPITAL COURSE:  He was admitted after ascertaining normal laboratory  values and underwent a posterior lumbar interbody fusion.  Postoperatively,  he has done relatively well.  He has got some right leg pain which has  slowed his mobilization, but now he is up and about.  His strength is full.  His incision has a small amount of redness and itching, but he is afebrile.  His Foley has been removed.  He is off the PCA and has been placed on  Neurontin, Valium, Percocet.  He is being discharged home on these  medications, in addition to ciprofloxacin and skin prophylaxis.  His  followup will be in the [facility] office in about a  week for suture removal.      Payton Doughty, M.D.    MWR/MEDQ  D:  05/11/2003  T:  05/11/2003  Job:  [id number]

## 2011-02-09 NOTE — H&P (Signed)
NAMETC, KAPUSTA               ACCOUNT NO.:  [id number]   MEDICAL RECORD NO.:  [id number]          PATIENT TYPE:  AMB   LOCATION:  DAY                           FACILITY:  [facility]   PHYSICIAN:  Dalia Heading, M.D.  DATE OF BIRTH:  1955/10/03   DATE OF ADMISSION:  DATE OF DISCHARGE:  LH                                HISTORY & PHYSICAL   CHIEF COMPLAINT:  Chronic cholecystitis.   HISTORY OF PRESENT ILLNESS:  The patient is a 55 year old white male who is  referred for evaluation and treatment of biliary colic secondary to chronic  cholecystitis.  He has been having right upper quadrant abdominal pain with  radiation to the right flank, nausea, bloating for several weeks.  He does  have fatty food intolerance.  No fever, chills, or jaundice have been noted.   PAST MEDICAL HISTORY:  1.  Chronic narcotic use due to back pain.  2.  Irritable bowel disease.   PAST SURGICAL HISTORY:  1.  Multiple neck surgeries.  2.  Lower back surgery.   CURRENT MEDICATIONS:  1.  Endocet 10/650, one tablet p.o. q.6h.  2.  OxyContin 40 mg p.o. q.i.d.  3.  Valium p.r.n.  4.  Pentasa.   ALLERGIES:  No known drug allergies.   REVIEW OF SYSTEMS:  The patient denies drinking or smoking.   PHYSICAL EXAMINATION:  GENERAL:  The patient is a well-developed, well-  nourished, white male in no acute distress.  VITAL SIGNS:  He is afebrile and vital signs are stable.  HEENT:  Reveals no scleral icterus.  LUNGS:  Clear to auscultation with equal breath sounds bilaterally.  HEART:  Reveals a regular rate and rhythm without S3, S4, or murmurs.  ABDOMEN:  Soft and nondistended.  He is tender in the right upper quadrant  to palpation.  No hepatosplenomegaly, masses, or hernias are identified.   Ultrasound of the gallbladder reveals no gallstones.  HIDA scan reveals  chronic cholecystitis with a normal gallbladder ejection fraction but  reproducible symptoms with CCK injection.   IMPRESSION:  Chronic  cholecystitis.   PLAN:  The patient is scheduled for a laparoscopic cholecystectomy on  August 22, 2005.  The risks and benefits of the procedure including  bleeding, infection, hepatobiliary injury, and the possibility of an open  procedure were fully explained to the patient, gave informed consent.      Dalia Heading, M.D.  Electronically Signed     MAJ/MEDQ  D:  08/21/2005  T:  08/21/2005  Job:  [id number]   cc:   Dalia Heading, M.D.  Fax: [phone]   [facility]  Fax: [phone]   Sherryll Burger, Dr.  Jonita Albee, [facility]   [name], M.D.  [address]  [place]  [place] [address]

## 2011-02-09 NOTE — H&P (Signed)
[place]  Patient:    Stephen Porter, Stephen Porter                        MRN: [id number] Adm. Date:  02/23/00 Attending:  Payton Doughty, M.D.                         History and Physical  ADMISSION DIAGNOSES:  Herniated disk C6-7 on the left side with a left C7 radiculopathy.  HISTORY OF PRESENT ILLNESS:  This is a 55 year old right-handed white gentleman who I saw several years ago for a lumbar disk at 5-1.  He did well with conservative management.  He called me back eight weeks ago experiencing pain in his neck out towards his left scapula, down his arm, tingling in the middle and ring fingers on the left side.  A chiropractor did not get him much better.  An MRI demonstrated this at 6-7 and he was referred to me.  PAST MEDICAL HISTORY:  Medical history is otherwise unremarkable.  CURRENT MEDICATIONS:  He has been using Darvocet, Flexeril, Valium, and Motrin.  ALLERGIES:  He has no allergies.  PAST SURGICAL HISTORY:  He has had no operations.  FAMILY HISTORY:  Mom is 22 and father is 60.  Brother is in fair health.  SOCIAL HISTORY:  He smokes a few cigarettes a day.  He does not drink alcohol. He is a Curator at ______.  REVIEW OF SYSTEMS:  Remarkable for wearing glasses for reading.  Difficulty with urination on occasion.  Arm weakness, arm pain, neck pain, and diplopia.  PHYSICAL EXAMINATION:  HEENT:  Within normal limits.  NECK:  He has reasonable range of motion in his neck.  Extension reproduces left shoulder and arm pain.  CHEST:  A few crackles.  CARDIAC:  Regular rate and rhythm.  ABDOMEN:  Nontender.  No hepatosplenomegaly.  EXTREMITIES:  Without clubbing or cyanosis.  GU:  Deferred.  Peripheral pulses are good.  NEUROLOGIC:  He is awake, alert, and oriented.  His cranial nerves are intact. Motor exam shows 5/5 strength throughout the upper extremities save for left triceps, which is 5-/5.  Sensory changes are  described in the left C7 distribution.  Deep tendon reflexes are absent in the upper and lower extremities.  Hoffmanns is negative.  LABORATORY DATA:  MRI shows spondylitic change on the left C7 nerve root, compression in the foramen by disk and ______ vertebral spurring.  CLINICAL IMPRESSION:  Left C7 radiculopathy secondary to herniated disk and spurring at C5-6.  PLAN:  The plan is for an anterior cervical diskectomy and fusion.  The risks and benefits of this approach have been discussed with him and he wishes to proceed. DD:  02/23/00 TD:  02/23/00 Job: [id number]

## 2011-02-09 NOTE — Op Note (Signed)
NAMENOACH, Stephen Porter               ACCOUNT NO.:  [id number]   MEDICAL RECORD NO.:  [id number]          PATIENT TYPE:  AMB   LOCATION:  DAY                           FACILITY:  [facility]   PHYSICIAN:  Dalia Heading, M.D.  DATE OF BIRTH:  15-Dec-1955   DATE OF PROCEDURE:  08/22/2005  DATE OF DISCHARGE:                                 OPERATIVE REPORT   PREOPERATIVE DIAGNOSIS:  Chronic cholecystitis.   POSTOPERATIVE DIAGNOSIS:  Chronic cholecystitis.   PROCEDURE:  Laparoscopic cholecystectomy.   SURGEON:  Dr. Franky Macho.   ASSISTANT:  Dr. Arna Snipe.   ANESTHESIA:  General endotracheal.   INDICATIONS:  The patient is a 56 year old white male who presents with  chronic cholecystitis. The risks and benefits of the procedure including  bleeding, infection, hepatobiliary injury, the possibility of an open  procedure, and the possibility of recurrence of his symptoms were fully  explained to the patient, who gave informed consent.   PROCEDURE NOTE:  The patient was placed in a supine position. After  induction of general endotracheal anesthesia, the abdomen was prepped and  draped using the usual sterile technique with Betadine. Surgical site  confirmation was performed.   A supraumbilical incision was made down to the fascia. A Veress needle was  introduced into the abdominal cavity, and confirmation of placement was done  using the saline drop test. The abdomen was then insufflated to 16 mmHg  pressure. An 11-mm trocar was introduced into the abdominal cavity under  direct visualization without difficulty. The patient was placed in reversed  Trendelenburg position. An additional 11-mm trocar was placed in the  epigastric region, and 5-mm trocars were placed in the right upper quadrant  and right flank regions. The liver was inspected and noted to normal limits.  The gallbladder was retracted superiorly and laterally. Dissection was begun  around the infundibulum of the  gallbladder. The cystic duct was first  identified. Its juncture to the infundibulum fully identified. Endoclips  were placed proximally and distally on cystic duct, and cystic duct was  divided. This was likewise done to the cystic artery. The gallbladder then  freed away from the gallbladder fossa using Bovie electrocautery. The  gallbladder was delivered through the epidural trocar site using an  EndoCatch bag. Gallbladder fossa was inspected, and no abnormal bleeding or  bile leakage was noted. Surgicel was placed in the gallbladder fossa. All  fluid and air were then evacuated from the abdominal cavity prior to removal  of the trocars.   All wounds were irrigated with normal saline. All wounds were injected with  0.5% Sensorcaine. The supraumbilical fascia was reapproximated using a 0  Vicryl interrupted suture. All skin incisions were closed using staples.  Betadine ointment and dry sterile dressings were applied.   All tape and needle counts were correct at the end of the procedure. The  patient was extubated in the operating room and went back to recovery room  awake in stable condition.   COMPLICATIONS:  None.   SPECIMENS:  Gallbladder.   BLOOD LOSS:  Minimal.      [name]  [name], M.D.  Electronically Signed     MAJ/MEDQ  D:  08/22/2005  T:  08/23/2005  Job:  [id number]   cc:   [name], M.D.  [address]  [place]  [place] [address]   Sherryll Burger, M.D.  [place], [place]

## 2011-02-09 NOTE — Op Note (Signed)
Stephen Porter, Stephen Porter                           ACCOUNT NO.:  [id number]   MEDICAL RECORD NO.:  [id number]                  PATIENT TYPE:   LOCATION:                                       FACILITY:   PHYSICIAN:  [name], M.D.                 DATE OF BIRTH:   DATE OF PROCEDURE:  01/09/2004  DATE OF DISCHARGE:                                 OPERATIVE REPORT   PROCEDURE:  Total colonoscopy with terminal ileoscopy.   ENDOSCOPIST:  [name], M.D.   INDICATIONS:  [name] is a 55 year old Caucasian male with a 6 weeks' history  of abdominal pain and weight loss. He was found to have small-bowel ulcers  on capsule study.  This study suggested noncritical strictures as well.  It  was felt to be in the distal small bowel.  He is, therefore, undergoing  colonoscopy with terminal ileoscopy.  Although he has not had any diarrhea;  however, if he has some colitis that may help [place] in making a definite  diagnosis.  The procedure and risks were reviewed with the patient and  informed consent was obtained.   PREOPERATIVE MEDICATIONS:  Demerol 25 mg IV and Versed 12 mg IV.   FINDINGS:  Procedure performed in endoscopy suite.  The patient's vital  signs and O2 saturation were monitored during the procedure and remained  stable.  The patient was placed in the left lateral recumbent position and  rectal examination was performed.  No abnormality noted on external or  digital exam.   Olympus videoscope was placed in the rectum and advanced under vision into  the sigmoid colon and beyond.  Preparation was excellent.  Scope was passed  to the cecum which was identified by ileocecal valve and appendiceal  orifice.  Pictures taken for the record.  The scope was advanced into the  terminal ileum for at least 25-30 cm.  Except for two areas with mucosal  granularity and a bland appearance the rest of it was normal.  Multiple  pictures were taken for the record.  Biopsies were taken from this area.   As  the scope was withdrawn the colonic mucosa was, once again, carefully  examined and was normal throughout.  Rectal mucosa similarly was normal.   The scope was retroflexed to examine the anorectal junction and small  hemorrhoids were noted dentate line with erythema.  The endoscope was  straightened and withdrawn.   FINAL DIAGNOSES:  1. No evidence of ulcers or erosions in the distal 25-30 cm.  Focal area of     abnormal looking mucosa was biopsied for histology.  2. Normal examination of the colon and rectum.  3. Small external hemorrhoids.   RECOMMENDATIONS:  1. Will obtain IBD panel.  2. Will check with tertiary center if double balloon enteroscopy is     possible; otherwise, would consider abdominal angiography.      ___________________________________________  [name], M.D.   NR/MEDQ  D:  01/09/2004  T:  01/09/2004  Job:  [id number]

## 2011-02-09 NOTE — Op Note (Signed)
NAME:  Stephen Porter, Stephen Porter                         ACCOUNT NO.:  [id number]   MEDICAL RECORD NO.:  [id number]                   PATIENT TYPE:  INP   LOCATION:  3012                                 FACILITY:  [facility]   PHYSICIAN:  Payton Doughty, M.D.                   DATE OF BIRTH:  10/18/1955   DATE OF PROCEDURE:  05/06/2003  DATE OF DISCHARGE:                                 OPERATIVE REPORT   PREOPERATIVE DIAGNOSIS:  Herniated disk, spondylosis at L5-S1 on the left.   POSTOPERATIVE DIAGNOSIS:  Herniated disk, spondylosis at L5-S1 on the left.   OPERATION PERFORMED:  L5-S1 laminectomy, diskectomy, posterior lumbar  interbody fusion, posterolateral arthrodesis.   SURGEON:  Payton Doughty, M.D.   ASSISTANT:  1. Clydene Fake, M.D.  2. [place].   ANESTHESIA:  General endotracheal.   PREP:  Sterile Betadine prep and scrub with alcohol wipe.   COMPLICATIONS:  None.   INDICATIONS FOR PROCEDURE:  The patient is a 55 year old right-handed white  gentleman with severe lumbar spondylosis and left L5 radiculopathy.   DESCRIPTION OF PROCEDURE:  The patient was taken to the operating room,  smoothly anesthetized and intubated and placed  prone on the operating  table.  Following shave, prep and drape in the usual sterile fashion, the  skin was infiltrated with 1% lidocaine and 1:400,000 epinephrine.  The skin  was incised from mid L5 to mid S1 and the lamina and transverse processes of  L5 and then sacral ala were exposed bilaterally in the subperiosteal plane.  Intraoperative x-ray confirmed correctness of level.  Having confirmed  correctness of level, the lamina, pars interarticularis and inferior facet  of L5 and superior facet of S1 were removed bilaterally.  This allowed  decompression of the 5 roots in the lateral recesses.  There was a foraminal  disk at L5-S1 on the left side with significant compression of the left L5  root.  This was removed without difficulty, resulting in  immediate  decompression of the L5 root.  Following this, Ray threaded fusion cages 16  x 21 mm were placed and packed with bone graft harvested from the facet  joints.  They were capped.  Intraoperative x-ray showed good placement of  the cages.  The transverse processes and sacral ala were then decorticated  with a high speed drill and the remaining bone mixed with demineralized bone  matrix and packed over them to create an intertransverse posterolateral  arthrodesis.  The wound was irrigated, hemostasis was assured.  The fascia  was reapproximated with 0 Vicryl in interrupted fashion, subcutaneous  tissues reapproximated with 0 Vicryl in interrupted fashion, subcuticular  tissues  reapproximated with 3-0 Vicryl in interrupted fashion.  Skin was closed with  3-0 nylon in a running locked fashion.  Betadine and Telfa dressing was  applied and made occlusive with OpSite and patient returned to recovery room  in good condition.                                                Payton Doughty, M.D.    MWR/MEDQ  D:  05/06/2003  T:  05/07/2003  Job:  [id number]

## 2011-02-09 NOTE — Op Note (Signed)
[place]. [facility]  Patient:    Stephen Porter, Stephen Porter                      MRN: [id number] Proc. Date: 02/23/00 Adm. Date:  [id number] Disc. Date: [id number] Attending:  Emeterio Reeve                           Operative Report  PREOPERATIVE DIAGNOSIS:  Herniated disk C6-7 left.  POSTOPERATIVE DIAGNOSIS:  Herniated disk C6-7 left.  OPERATIVE PROCEDURE:  Anterior cervical diskectomy and fusion at C6-C7 with an A-line plate and microscopic dissection.  SURGEON:  Payton Doughty, M.D.  ANESTHESIA:  General endotracheal.  PREP:  Shave and prepped, scrubbed with alcohol wipe.  COMPLICATIONS:  None.  BODY OF TEXT:  A 55 year old gentleman with herniated disk at C6-7 on the left, C7 radiculopathy.   He was taken to the operating room, smoothly anesthetized, intubated and placed supine position on the operating table. Following shave, prepped and draped in usual sterile fashion.  Skin was incised in the midline at the medial border of the sternocleidomastoid muscle on the left side.  The platysma was identified, elevated, divided and undermined.  Carotid artery was identified and medial dissection revealed the trachea and esophagus, retracted laterally to the right; carotid artery retracted laterally to the left exposing the bones of the anterior cervical spine.  Marker was placed and intraoperative x-ray showed to be at C5-C6. Diskectomy was carried out at the level below that, which was C6-C7.  The disk was removed under gross observation and then using the operating microscope, the neural foramina were carefully explored and the disk removed.  On the left side, there was a large herniated disk extending out into the neuroforamen. It was grasped and removed without difficulty.  Following its removal, the neuroforamen was carefully explored and the nerve found to be free.  The wound was irrigated and hemostasis assured.  A 7 mm bone graft was fashioned  with patellar allograft and tapped into the disk space.  A 24 mm A-line plate was then placed with two screws in C5 and two C6.  Intraoperative x-ray showed good placement of bone graft, plate, the upper two screws.  The lower two screws were not visualized.  The wound was irrigated and hemostasis assured. Locking screws were placed.  The platysma was reapproximated with 3-0 Vicryl interrupted fashion.  Subcutaneous tissue reapproximated with 3-0 Vicryl interrupted fashion.  The skin was closed with 4-0 Vicryl in a running subcuticular fashion.  Benzoin and Steri-Strips were placed and occlusive with Telfa and OpSite.  The patient then returned to the recovery room in good condition after being placed in Aspen collar. DD:  02/23/00 TD:  02/27/00 Job: [id number]

## 2011-02-09 NOTE — Discharge Summary (Signed)
[place]. [facility]  Patient:    Stephen Porter, Stephen Porter                      MRN: [id number] Adm. Date:  [id number] Disc. Date: [id number] Attending:  Emeterio Reeve                           Discharge Summary  ADMISSION DIAGNOSIS:  C5-6 herniated disc.  FINAL DIAGNOSIS:  C5-6 herniated disc.  CHIEF COMPLAINT:  Patient was admitted because of neck pain with radiation to the arm.  His x-rays show a herniated disc at the level of 5-6.  Surgery was advised.  LABORATORY DATA:  Normal.  HOSPITAL COURSE:  The patient was taken to surgery yesterday by Dr. Trey Sailors and anterior 5-6 discectomy followed by fusion was done.  Today he is doing great has minimal complaints and he is ready to go home.  DISCHARGE CONDITION:  Improved.  MEDICATIONS: 1. Percocet. 2. Diazepam.  DIET:  Regular.  ACTIVITY:  Not to drive.  FOLLOWUP:  To be seen by Dr. Channing Mutters in two weeks. DD:  02/24/00 TD:  02/27/00 Job: [id number]

## 2011-02-09 NOTE — Discharge Summary (Signed)
NAMECARLSON, BELLAND                         ACCOUNT NO.:  [id number]   MEDICAL RECORD NO.:  [id number]                   PATIENT TYPE:  INP   LOCATION:  A302                                 FACILITY:  [facility]   PHYSICIAN:  [name], M.D.                 DATE OF BIRTH:  09/03/1956   DATE OF ADMISSION:  01/08/2004  DATE OF DISCHARGE:  01/10/2004                                 DISCHARGE SUMMARY   DISCHARGE DIAGNOSES:  1. Small bowel ulcers, etiology undetermined.  2. Chronic neck and lower back pain due to disk disease as well as     arthritis.   CONDITION ON DISCHARGE:  Improved.   PRINCIPAL PROCEDURE:  Total colonoscopy with ileoscopy by Dr. Karilyn Cota on  January 09, 2004.   DISCHARGE MEDICATIONS:  1. Prednisone 30 mg q.a.m. for one week.  Thereafter, we will drop the dose     to 20 mg p.o. q.a.m.  2. Dicyclomine 10 mg t.i.d.  3. Percocet 5/500 one q.4h. p.r.n.   HOSPITAL COURSE:  [name] is a 55 year old Caucasian male patient of Dr. Sherryll Burger  who has been experiencing abdominal pain for the last six weeks.  His pain  has been associated with nausea and vomiting ___________ but not recently.  He also has lost weight.  He had multiple studies during hospitalization at  [facility] which were all negative.  He had ultrasound, EGD, hepatobiliary scan as  well as abdominal/pelvic CT.  He was seen by me in consultation on December 30, 2003.  A visit to the ER the day before revealed leukocytosis and thickening  to mid small bowel.  He therefore had small bowel study which also showed  nonspecific thickening to mid small intestine. He was empirically treated  with Cipro and Flagyl as well as antispasmodic, but never became pain free.  He returned for small bowel capsule study performed last week which showed  multiple ulcers and noncritical narrowing in two areas which was felt to be  more in the distal small bowel.  Plan was for him to return for colonoscopy  electively on January 11, 2003, but on  the day of admission, he developed  severe mid and lower abdominal pain unresponsive to oral narcotic therapy.  He was therefore hospitalized for pain control and further evaluation.  His  lab studies were normal except sed rate was mildly elevated at 33 and a C-  reactive protein was also mildly elevated.  He was prepped for colonoscopy  which was performed on January 09, 2004.  Examination of the colon was normal  except hemorrhoids.  A TL was also examined for at least 24-30 cm except for  a focal mucosal granularity, it was normal.  Biopsy was taken from this  area.  I did not see any ulcers or erosions.  He was empirically begun on  Solu-Medrol with the thought that he may have small bowel  Crohn's disease.  He noticed symptomatic improvement.  Finally, he had CT of abdomen today  which revealed patent celiac trunk and SMA.  There was minimal plaquing of  posterior and left posterior lateral wall of distal abdominal aorta with  atherosclerotic ulcerations.  However, there was question of the mild  stenosis to the origin of IMA.  This was felt to be insignificant, given  wide open celiac trunk and SMA.  The patient was given regular diet which he  tolerated well.  Inflammatory bowel disease panel was obtained.  The patient  was discharged in improved condition.   I will contact the patient with results of pending lab studies.  We will  start tapering his prednisone in one week from now.  He will be returning  our office visit in the next 2-3 weeks.     ___________________________________________                                         [name], M.D.   NR/MEDQ  D:  01/10/2004  T:  01/11/2004  Job:  [id number]   cc:   Sherryll Burger, M.D.  [place], [place]

## 2011-02-09 NOTE — H&P (Signed)
Stephen Porter, Stephen Porter                         ACCOUNT NO.:  [id number]   MEDICAL RECORD NO.:  [id number]                   PATIENT TYPE:  EMS   LOCATION:  ED                                   FACILITY:  [facility]   PHYSICIAN:  [name], M.D.                 DATE OF BIRTH:  1956/08/16   DATE OF ADMISSION:  02/05/2004  DATE OF DISCHARGE:  02/05/2004                                HISTORY & PHYSICAL   CHIEF COMPLAINT:  Severe abdominal pain and nausea and vomiting.   HISTORY OF PRESENT ILLNESS:  Stephen Porter is a 55 year old Caucasian male who called  me this morning stating that he had sudden onset of severe abdominal pain  starting in the left epigastric area radiating to the mid and lower abdomen,  associated with nausea.  He did take his pain medication which did not help.  The patient says the pay was severe enough to double him over and he was  just profusely sweating.  The patient was asked to come to the emergency  room.  In the emergency room he was given Dilaudid and feels a lot better.  He denies hematemesis, fever or chills.  He is having bowel movement every  day like clockwork.  He is on prednisone presently 15 mg q.a.m.  He is also  on oxycodone 15 mg q.a.m. and Endocet two tablets t.i.d.  Stephen Porter is well know to me from recent evaluation in the office and subsequent  hospitalization.  He has been having episodes of abdominal pain with nausea  and vomiting.  He has had extensive evaluation, including CT, ultrasound,  hepatobiliary scan, EGD.  Following his visit to our office, he had a small  bowel study which showed mucosal edema and thickening.  Abdominal CT (the  second one) showed thickening to small bowel which was confirmed and small  bowel follow through.  He had colonoscopy with ileoscopy.  Biopsy from GI  did not show any changes of Crohn's disease.  He had capsule study prior to  that which showed ulcers in mid to distal small bowel with two areas,  noncritical narrowing.   His inflammatory bowel disease was negative.  He was  treated with prednisone starting with 30 mg q.a.m., and he has been  gradually tapering it.  He had not taken abdominal pain.  He was seen in the  office a little over two weeks ago and has an appointment next month.  He  says he has done well for a few weeks.  He has not lost any weight since I  saw him.   OBJECTIVE:  VITAL SIGNS:  Pulse 91, blood pressure 134/90, respirations 24,  temperature 97.7.  HEENT:  Conjunctivae is pink.  Sclerae nonicteric.  NECK:  Without masses or thyromegaly.  HEART/LUNGS:  Within normal limits.  ABDOMEN:  Full, bowel sounds are normal.  On palpation, it is soft and  nontender.  He is pain free.  When I examined his abdomen, he has received 4  mg of Dilaudid IV.  A few minutes later he was complaining of abdominal pain  and I re-examined him, and he has slight tenderness in LUQ, but he does not  have any palpable mass that I would think of intussusception.  Bowel sounds  are still normal.   STUDIES:  Chest x-ray is negative.  There is no pneumoperitoneum.  Acute  abdominal series shows air fluid levels, more in the left upper quadrant.  He has a distended loop of small bowel on the left side and supine found.   LABORATORY DATA:  WBC 18.3, H&H 16.1 and 46.7, platelet count 232,000.  Bilirubin is 0.7.  AP 76, AST 22, ALT 23.  Sodium 136, potassium 3.3,  chloride  102, CO2 25, glucose 115, BUN 3, creatinine 0.9, calcium 9.5.   ASSESSMENT:  Stephen Porter is a 55 year old Caucasian male who has recurrent episodes  of pain which are described to be excruciating.  He was well documented to  have small bowel ulcers.  However, IBD panel was negative.  He has improved  with prednisone which is being tapered.  However, today's presentation is  suspicious for partial small bowel obstruction.  He has single loop which is  distended, and he also had air fluid levels and upright film.  This could be  due to intussusception  or small bowel stricture or closed loop obstruction.  I had been planning to sent him to [place], [place] for double balloon  enteroscopy so these ulcers could be biopsies.  But I fell, given his  presentation and extra findings, we should get a surgical opinion, and maybe  he should have a diagnostic laparotomy.  I have recommended admission with  consultation with Dr. Lovell Sheehan.  The patient feels better and wants to go  home and would see him on Monday or Tuesday.  He will be given another dose  of Dilaudid and will let him stay in the emergency room another 30-60  minutes.     ___________________________________________                                         [name], M.D.   NR/MEDQ  D:  02/05/2004  T:  02/05/2004  Job:  [id number]   cc:   Annette Stable Hasanaj  [address]  [place]  [place] [address]  Fax: [phone]

## 2011-02-09 NOTE — H&P (Signed)
Stephen Porter, Stephen Porter               ACCOUNT NO.:  [id number]   MEDICAL RECORD NO.:  [id number]            PATIENT TYPE:   LOCATION:                                 FACILITY:   PHYSICIAN:  Stephen Porter, M.D.    DATE OF BIRTH:  06-04-1956   DATE OF ADMISSION:  02/27/2006  DATE OF DISCHARGE:  LH                                HISTORY & PHYSICAL   PRESENTING COMPLAINT:  Abdominal pain.   HISTORY OF PRESENT ILLNESS:  Stephen Porter is a 55 year old Caucasian male who  presents with severe generalized abdominal pain but is mainly concentrated  in mid abdomen.  He states this pain started six weeks ago.  Pain is  described to be gnawing, burning, migratory associated with nausea and  anorexia but no vomiting.  He states he has no energy, no life.  He also  complains of neck and back pain which is chronic.  He denies fever or  chills.  He is having one to two stools per day.  He was having problems  with constipation until he stopped his OxyContin about two months ago on his  own and experienced withdrawal symptoms for about a week.  He reports  passing dark stools but denies melena or rectal bleeding.  He has lost 7  pounds in the last 3 months since he was seen in our office which was  November 21, 2005.  At that time, he was primarily complaining of back and  neck pain.  He felt that the narcotic that he was taking for his back pain  was helping his intestinal pain.   Stephen Porter was seen by Dr. Sherryll Burger about two weeks ago and was given a Medrol Dosepak  with the thought that he may have relapse of his Crohn's disease, but he  reports no improvement.  He wants me to admit him to the hospital for  further workup.   He has history of Crohn's disease reviewed under past medical history.   MEDICATIONS:  1.  He is on Lexapro 20 mg q.d.  2.  Pentasa 1 g 3 to 4 times a day.  3.  Diazepam 5 mg once or twice daily.  4.  Endocet 10/325 t.i.d.  5.  Protonix 40 mg q.a.m.  6.  Dilaudid 4 mg b.i.d. p.r.n. which I  believe was given to him by Dr.      Sherryll Burger.   PAST MEDICAL HISTORY:  Stephen Porter was initially seen by me in April 2005 with  recurrent upper abdominal and mid pain.  He had had multiple studies in  [place], [place], which were negative.  He had a repeat CT which showed  thickened segment of mid small bowel.  He had small bowel follow-through  which revealed thickening to jejunal mucosa.  His colonoscopy with terminal  ileoscopy was normal.  He had Given capsule study on January 04, 2004 which  revealed multiple ulcers and one stricture, but the capsule was able to pass  this area.  It was felt that he had Crohn's disease although his IBD panel  was negative.  He  also had been on NSAIDs which were discontinued.  He was  treated with steroids and then mesalamine.  He has always had abdominal  symptoms.  He had follow-up small-bowel study in April 04, 2004 which again  revealed abnormal loops to mid small bowel felt to be the same area that was  seen on prior study of April 2005.  His TI was normal.  He had third small-  bowel study in September 2006 along with a CT, both of which were normal.  We felt he was constipated.   He had laparoscopic cholecystectomy in November 2006 for biliary dyskinesia.  He has been felt to be depressed secondary to his chronic illness.  He was  treated for left eye infection in 2002 felt to be due to herpes simplex, but  he has been asymptomatic.  He has had three neck surgeries.  He has had  fusion between C6 and C7 in 2001, second surgery in 2002, and he had another  surgery in April 2004.  He also has had fusion to his lumbar spine and still  having problems.  He has had problems with narcotic dependence.   ALLERGIES:  NK.   SOCIAL HISTORY:  He is married.  He has two daughters.  He is on disability.  He does not drink alcohol.  He also has quit cigarette smoking.   OBJECTIVE:  VITAL SIGNS:  Weight 192 pounds.  He is 5 feet.  Pulse 88 per  minute, blood  pressure 124/82, temperature is 98.5.  HEENT:  Conjunctivae is pink.  Sclerae is nonicteric.  Oropharynx mucosa is  normal.  NECK:  No neck masses or thyromegaly noted.  LUNGS:  Clear to auscultation.  ABDOMEN:  His abdomen is symmetrical.  Bowel sounds are normal.  No bruits  noted.  On palpation is soft.  He complains of generalized discomfort.  However, there is no guarding, mass or organomegaly.  RECTAL:  Examination reveals soft stool in the vault which is guaiac  negative.  He does not have peripheral edema or clubbing.   ASSESSMENT:  Stephen Porter is a 68 year old Caucasian male who has been diagnosed  with jejunal Crohn's disease in April 2005 who has been on mesalamine since  then and has had prednisone intermittently who last small bowel follow-  through was normal in September 2006, now presents with unrelenting pain  associated with nausea.  His pain apparently started sometime after he  stopped his OxyContin without taper.  He is presently on Endocet t.i.d.,  primarily for his musculoskeletal pain.  His abdominal exam is unremarkable,  and stool is guaiac negative.  I Porter not believe that his abdominal pain is  secondary to Crohn's disease.  He clearly is depressed which is felt to be  multifactorial.  There is also an issue of dependence on narcotics.  He  should not have stopped his OxyContin suddenly.  I Porter not see any urgent  need to admit him in the hospital.  However, I agree he needs to have  further workup as soon as possible.  If he feels that he needs to be  hospitalized, he can ask Dr. Sherryll Burger to admit him at [facility], and I will be glad to  assist in his management.   The patient's condition was reviewed with his wife and their daughter.   PLAN:  He will have CBC, sed rate, CRP and Chem 20 in a.m. along with  abdominopelvic CT with oral and IV contrast.  If this study  is normal, we  will proceed with small-bowel Given capsule study.  I have not changed his medicines  today.      Stephen Porter, M.D.  Electronically Signed     NR/MEDQ  D:  02/27/2006  T:  02/27/2006  Job:  [id number]   cc:   Stephen Doughty, M.D.  Fax: [phone]

## 2011-02-09 NOTE — H&P (Signed)
NAME:  Stephen Porter, Stephen Porter                         ACCOUNT NO.:  [id number]   MEDICAL RECORD NO.:  [id number]                   PATIENT TYPE:  INP   LOCATION:  NA                                   FACILITY:  [facility]   PHYSICIAN:  Payton Doughty, M.D.                   DATE OF BIRTH:  Jun 02, 1956   DATE OF ADMISSION:  08/14/2002  DATE OF DISCHARGE:                                HISTORY & PHYSICAL   ADMITTING DIAGNOSIS:  Herniated disk at C5-C6.   SERVICE:  Neurosurgery.   HISTORY OF PRESENT ILLNESS:  This is a very nice 55 year old right-handed  white gentleman who had the 6-7 disk done for a C7 radiculopathy on the left  side.  In August, he started having pain in his neck.  MRI showed a disk at  5-6, eccentric to the left.  He has undergone epidural steroids to no avail  and is now admitted for an anterior cervical diskectomy and fusion.   PAST MEDICAL HISTORY:  Medical history is unremarkable.   MEDICATIONS:  He is on no medications.   ALLERGIES:  He has no allergies.   PAST SURGICAL HISTORY:  He has had no other operations.   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   FAMILY HISTORY:  Dad is 83, in fair health; mother is 76, in fair health.   REVIEW OF SYSTEMS:  Review of systems is remarkable for neck pain, left  shoulder pain and arm pain.   PHYSICAL EXAMINATION:  HEENT:  Exam is within normal limits.  NECK:  He has good range of motion of the neck.  CHEST:  Chest has diffuse crackles.  CARDIAC:  Exam is unremarkable.  ABDOMEN:  Abdomen is nontender with no hepatosplenomegaly.  EXTREMITIES:  Extremities are without clubbing or cyanosis.  GU:  Deferred.  PERIPHERAL PULSES:  Good.  NEUROLOGIC:  He is awake, alert and oriented.  His cranial nerves are  intact.  Motor exam shows 5/5 strength throughout the right side.  On the  left side, he has full strength in the deltoid; the biceps is weak, as is  the  triceps; interossei are slightly weak.   LABORATORY AND ACCESSORY CLINICAL DATA:  MRI shows a left C5-6 disk with  obstruction of the left C6 root.  There is no cord compression.   CLINICAL IMPRESSION:  Left C6 radiculopathy related to a herniated disk at 5-  6.   PLAN:  My plan is for an anterior cervical diskectomy and fusion.  The risks  and benefits of this approach have been discussed with him and he wishes to  proceed.  Payton Doughty, M.D.    MWR/MEDQ  D:  08/14/2002  T:  08/14/2002  Job:  [id number]

## 2011-02-09 NOTE — Op Note (Signed)
NAME:  Stephen Porter, Stephen Porter                         ACCOUNT NO.:  [id number]   MEDICAL RECORD NO.:  [id number]                   PATIENT TYPE:  INP   LOCATION:  2899                                 FACILITY:  [facility]   PHYSICIAN:  Payton Doughty, M.D.                   DATE OF BIRTH:  1956/04/17   DATE OF PROCEDURE:  08/14/2002  DATE OF DISCHARGE:                                 OPERATIVE REPORT   PREOPERATIVE DIAGNOSIS:  Herniated disk at C5-6.   POSTOPERATIVE DIAGNOSIS:  Herniated disk at C5-6.   PROCEDURE:  C5-6 anterior cervical decompression and fusion.   SURGEON:  Payton Doughty, M.D.   ASSISTANTS:  Nurse assistant:  Basilia Jumbo.  Doctor assistant:  Cristi Loron, M.D.   ANESTHESIA:  General endotracheal.   PREPARATION:  Sterile Betadine prep and scrub with alcohol wipe.   COMPLICATIONS:  None.   DESCRIPTION OF PROCEDURE:  This is a 55 year old gentleman who has had a  prior fusion at C6-7 and now has a herniated disk at C5-6.  He was taken to  the operating room and smoothly anesthetized and intubated, placed supine on  the operating table.  Following shave, prep, and drape in the usual sterile  fashion, skin was incised in the midline in the medial border of the  sternocleidomastoid muscle on the left side.  The platysma is identified,  elevated, divided, and undermined.  The sternocleidomastoid is identified  and medial dissection revealed the carotid artery, retracted laterally to  the left, trachea and esophagus were retracted laterally to the right.  The  bones of the anterior cervical spine were thus identified along with the  plate at Z6-1.  Working just above the top into the C6 plate, diskectomy was  carried out at the 5-6 disk space.  The plate came to the bottom end of the  disk space and was not removed.  The operating microscope was then brought  in, and we used microdissection technique to remove the disk, dissecting the  anterior epidural space, removed the  posterior longitudinal ligament, and  explored the nerve roots.  On the left side there was a large herniated disk  found.  It was removed without difficulty and resulted in significant  decompression of the left C6 root.  Following complete decompression, a 7 mm  bone graft was fashioned from patellar allograft and tapped into place.  Intraoperative x-ray showed good placement of bone graft.  It was elected  not to remove the other plate to plate this level because of concerns for  risk to the esophagus, which was densely scarred to the plate.  The wound  was irrigated and hemostasis assured.  The platysma was reapproximated with  3-0 Vicryl in interrupted fashion, the subcutaneous tissue was  reapproximated with 3-0 Vicryl in interrupted fashion, and the skin was  closed with 4-0 Vicryl in a running subcuticular  fashion.  Benzoin and Steri-  Strips were placed, made occlusive with Telfa and OpSite, and the patient  returned to the recovery room in good condition.                                               Payton Doughty, M.D.    MWR/MEDQ  D:  08/14/2002  T:  08/14/2002  Job:  [id number]

## 2011-02-09 NOTE — Op Note (Signed)
NAMEARIUS, HARNOIS               ACCOUNT NO.:  [id number]   MEDICAL RECORD NO.:  [id number]          PATIENT TYPE:  AMB   LOCATION:  ENDO                         FACILITY:  [facility]   PHYSICIAN:  Petra Kuba, M.D.    DATE OF BIRTH:  March 04, 1956   DATE OF PROCEDURE:  DATE OF DISCHARGE:  03/29/2006                                 OPERATIVE REPORT   PROCEDURE:  Capsule endoscopy.   INDICATIONS:  Patient with a questionable history of Crohn's, has a history  of small bowel ulcers on previous capsule endoscopy.  Want to recheck.  Consent was signed after risks, benefits, methods, options thoroughly  discussed in the office.   PROCEDURE:  The capsule endoscopy is performed by the endoscopy nurse and I  reviewed the video in the customary fashion.   SUMMARY:  1.  Normal small bowel.  I reviewed this x2 completely.  2.  Minimal gastritis.  3.  Normal esophageal views, normal views of the colon, although with liquid      stool present on all pictures of colon, difficult to assess.   RECOMMENDATIONS:  Will contact the patient and let him know of normal  findings.  Will see him back in the office in a few weeks when convenient to  rediscuss symptoms and discuss any other workup plans or medical options.           ______________________________  Petra Kuba, M.D.     MEM/MEDQ  D:  04/11/2006  T:  04/11/2006  Job:  [id number]   cc:   Payton Doughty, M.D.  Fax: [phone]

## 2011-02-09 NOTE — H&P (Signed)
NAME:  Stephen Porter, Stephen Porter                         ACCOUNT NO.:  [id number]   MEDICAL RECORD NO.:  [id number]                   PATIENT TYPE:  INP   LOCATION:  A302                                 FACILITY:  [facility]   PHYSICIAN:  [name], M.D.                 DATE OF BIRTH:  1956/03/16   DATE OF ADMISSION:  01/08/2004  DATE OF DISCHARGE:                                HISTORY & PHYSICAL   PRESENTING COMPLAINT:  Recurrent severe abdominal pain unresponsive to  outpatient therapy.   HISTORY OF PRESENT ILLNESS:  Stephen Porter is a 55 year old Caucasian male who was in  his usual state of health until about 6 weeks ago when he began to have a  vague mid-and-lower abdominal pain.  This was initially associated with  nausea and vomiting.  Lately he has had nausea, but no vomiting.  He was  hospitalized at [facility] 3 or 4 weeks ago.  He had multiple studies including  ultrasound, EGD and colonoscopy all of which were normal.  He was seen at  the emergency room at [facility] on December 29, 2003. He was noted to have  leukocytosis and CT showed thickening to midsmall bowel.  The patient was  seen in the office on December 30, 2003. He was begun on Cipro.  He was brought  back for a small bowel study on December 31, 2003.  It showed mucosal  thickening, speculation to midsmall bowel.  TI appeared to be normal.  With  this finding Flagyl was added to the Cipro.  He also has been maintained on  dicyclomine.  The patient has continued to have intermittent pain, but it  has not been intractable until this afternoon.  He had a small bowel capsule  study on January 04, 2004 which showed multiple ulcers which appeared to be in  the distal rather than proximal small bowel and 2 areas of noncritical  stricture.  Capsule never made it to the cecum.  The plan was for him to  have a total colonoscopy next week.   This morning he experienced some pain after breakfast in his left midabdomen  radiating across to the right side.  Even  though he did not eat any lunch he  had excruciating pain this afternoon.  He took a Vicodin and then 2 more but  without relief.  The pain doubled him over.  It was, therefore, decided to  admit him for pain control and further management.  He has lost about 10  pounds with this illness.  He has not experienced any fever, chills, melena,  or rectal bleeding.  A stool guaiac was negative in the office.  For the  last few days he has had a nocturnal incontinence.  He has found stool on  his clothes.  His stools, over the last few days, have been mushy, but not  loose or watery.  He denies dysuria, hematuria,  joint pains or skin rash.  He has chronic back pain.  He has had multiple back surgeries as reviewed  under past medical history and getting ready to have another one when he  recovers from his acute illness.   REVIEW OF SYSTEMS:  Negative for heartburn, dysphagia, chest pain, shortness  of breath.   CURRENT MEDICATIONS:  1. He is on Flagyl 250 mg p.o. b.i.d. and he has 6 doses left.  2. Vicodin 5/500 one q.i.d. p.r.n.  3. Dicyclomine 10 mg t.i.d.   PAST MEDICAL HISTORY:  1. He had anterior cervical discectomy and fusion at 6 and 7 in June 2001.     He had second neck surgery at C5 and 6 when he had anterior decompression-     fusion at C5 and 6 in November 2003.  2. In April 2004 he had anterior cervical surgery with removal of C6 and 7 A-     line plate and revision with fusion.  3. August 2004 he had __________fusion at C5 and C6.  4. In August 2004 he had L5-S1 laminectomy and discectomy and __________ of     fusion.  5. He had herpes simplex infection of left eye associated with one of his     surgeries.  It has not relapsed.   ALLERGIES:  NK.   FAMILY HISTORY:  Noncontributory.  Parents are in good health as are 3  brothers and 1 sister.  He has 1 uncle (second-degree relative) with Crohn's  disease.   SOCIAL HISTORY:  He is married.  He has 3 children who are in good  health.  He is presently working at [employer].  He had smoked for many years, but  quit earlier this week which was recommended to me strongly.  He drinks  alcohol very occasionally.   PHYSICAL EXAMINATION:  GENERAL:  A pleasant, well-developed, well-nourished,  Caucasian male who is in no acute distress.  Please note, he had received  pain medication.  VITAL SIGNS:  He weighs 181.5 pounds.  He is 5 feet 10 inches tall.  Pulse  78/minute, blood pressure 109/67, respiratory rate is 20 and temperature is  98.1.  HEENT:  Conjunctivae are pink.  Sclerae is nonicteric.  Oropharyngeal mucosa  is normal.  NECK:  Without masses or thyromegaly.  CARDIAC:  With regular rhythm.  Normal S1 and S2.  No murmur or gallop  noted.  ABDOMEN:  Full.  Bowel sounds are normal.  Palpation reveals soft abdomen  with tenderness in the left midabdomen, periumbilical area, as well as right  lower quadrant.  He is most tender in the right lower quadrant with some  guarding.  No rebound.  No organomegaly or masses noted.  RECTAL:  Examination is deferred.  EXTREMITIES:  No clubbing or edema noted.   LABS:  From today:  WBC is 9.2, H&H is 14 and 40.6, platelet count is  231,000.  Differential is normal.  Sedimentation rate is mildly elevated at  33.  Sodium 131, potassium 3.1, chloride 101, CO2 24, glucose 98, BUN 8,  creatinine 0.8, bilirubin 0.4.  AP is 77, AST 31, ALT 51.  Total protein 5.9  with albumin of 3.2 and calcium is 8.5.  Serum amylase is 77.   ASSESSMENT:  Stephen Porter is a 55 year old Caucasian male with a 6-week history of  intermittent mid-and-lower abdominal pain which, at times, is unbearable.  He is noted to have small-bowel ulcers with noncritical stricture based on  capsule study. Prior CT and  small-bowel studies suggested thickening to  midsmall bowel, but on a capsule study it appears more distal.  Given history of cigarette smoking we have to worry about abdominal angina, but CT  did not show  much calcification to his aorta and the celiac trunk and SMA  appeared to be wide open.  It is possible that we are dealing with Crohn's  disease of the small bowel although TI was not abnormal on the small-bowel  study and I am not sure that the capsule made it to this segment.  He does  give a history of hyperlipidemia, but recent lipid profile was unremarkable  except for triglycerides of about the 350 range.  The prior values are  unknown.  I doubt that we are dealing with infection.  He has been  empirically treated with Cipro and Flagyl, but without symptomatic  improvement.  We need a tissue diagnosis.  Colonoscopy with ileoscopy might  help [place] get to the abdominal segment that we could take biopsies.  Otherwise  he will need abdominal angiography as well as small bowel enteroscopy.   PLAN:  1. IV fluids.  2. IV analgesia.  3. Will check a CRP.  4. Will start him empirically on IV steroids which would help if this is     small-bowel Crohn's disease.  5. Colonoscopy with terminal ileoscopy to be performed in the a.m.  If this     study is entirely normal we will proceed with abdominal angiography prior     to sending him to a tertiary center for enteroscopy.     ___________________________________________                                         [name], M.D.   NR/MEDQ  D:  01/08/2004  T:  01/09/2004  Job:  [id number]   cc:   Kirstie Peri, MD  [address]  [place] [address]  Fax: [phone]

## 2011-02-09 NOTE — H&P (Signed)
[name], [name]                         ACCOUNT NO.:  [id number]   MEDICAL RECORD NO.:  [id number]                   PATIENT TYPE:  INP   LOCATION:  3012                                 FACILITY:  [facility]   PHYSICIAN:  Payton Doughty, M.D.                   DATE OF BIRTH:  [birth year]   DATE OF ADMISSION:  05/06/2003  DATE OF DISCHARGE:                                HISTORY & PHYSICAL   ADMISSION DIAGNOSES:  Spondylosis L5- S1 with foraminal disk.   HISTORY OF PRESENT ILLNESS:  This is a very nice 55 year old, right-handed  white gentleman who has had several anterior cervical diskectomies and  fusions and has done reasonably well.  He has developed back and left leg  pain.  MRI shows a large broad-based foraminal disk with extension to left  L5-S1 neural foramen, compression of left L5 root in the dorsal root  ganglion.  He is now admitted for decompression and fusion.   PAST MEDICAL HISTORY:  Fairly unremarkable.   MEDICATIONS:  Lorcet.   ALLERGIES:  None.   PAST SURGICAL HISTORY:  Anterior cervical x 2.   SOCIAL HISTORY:  Smokes a pack of cigarettes a day, does not drink alcohol.  He is a Buyer, retail.   FAMILY HISTORY:  Dad is 51 in fair health.  Mother is 27 in fair health.   REVIEW OF SYSTEMS:  Remarkable for neck pain, back pain, and left leg pain.   PHYSICAL EXAMINATION:  HEENT:  Within normal limits.  NECK:  Good range of motion with well-healed incision.  CHEST:  Clear.  CARDIAC:  Regular rate and rhythm.  ABDOMEN:  Nontender.  No hepatosplenomegaly.  EXTREMITIES:  Without clubbing or cyanosis.  GU:  Exam deferred.  PULSES:  Peripheral pulses are good.  NEUROLOGIC:  Awake, alert, and oriented.  Cranial nerves are intact.  Motor  exam shows 5/5 strength throughout the upper and lower extremities save for  the dorsiflexor of the left foot which is 5-/5.  There is a left L5 sensory  deficit.  Reflexes are 2 at the knees, 1 at the ankles.  Toes  downgoing  bilaterally.  Straight leg raise and reverse straight leg raise both  positive for left leg pain.   LABORATORY DATA:  MRI results have been reviewed above.   CLINICAL IMPRESSION:  Left L5 radiculopathy related to a foraminal disk at  L5-S1.    PLAN:  Lumbar laminectomy, diskectomy, posterior lumbar interbody fusion  with Ray threaded fusion cages.  This will allow decompression of the  lateral recess.  The risks and benefits of this approach  have been  discussed with him, and he wishes to proceed.  Payton Doughty, M.D.    MWR/MEDQ  D:  05/06/2003  T:  05/07/2003  Job:  [id number]

## 2011-02-09 NOTE — H&P (Signed)
NAME:  Stephen Porter, Stephen Porter                         ACCOUNT NO.:  [id number]   MEDICAL RECORD NO.:  [id number]                   PATIENT TYPE:  INP   LOCATION:  3011                                 FACILITY:  [facility]   PHYSICIAN:  Payton Doughty, M.D.                   DATE OF BIRTH:  19-Jun-1956   DATE OF ADMISSION:  01/12/2003  DATE OF DISCHARGE:                                HISTORY & PHYSICAL   ADMITTING DIAGNOSIS:  Compressed bone graft at C5-C6.   SERVICE:  Neurosurgery.   BODY OF TEXT:  This is a very nice, now 55 year old right-handed white  gentleman who underwent an anterior cervical diskectomy and fusion in  November.  We did it without a plate.  He did well from his operation,  returned to work and when at work, hit his head on a piece of steel, October 21, 2002.  After he hit his head, he began experiencing pain in his neck and  down his shoulders.  MRI did not demonstrate nerve root compression,  however, the plain films were obtained and showed compression and disruption  of the bone graft at C5-C6.  He has had increasing discomfort in his neck  and is now admitted for revision of his fusion.   MEDICAL HISTORY:  Medical history is remarkable for some herpes simplex  conjunctivitis.   MEDICATIONS:  He is on temazepam for sleep and Vicodin for pain.   ALLERGIES:  None.   SURGICAL HISTORY:  His neck operations.   SOCIAL HISTORY:  He smokes a pack of cigarettes a day, does not drink  alcohol, is a Buyer, retail.   FAMILY HISTORY:  Dad is 51 and in fair health.  Mother is 48, in fair  health.   REVIEW OF SYSTEMS:  Review of systems remarkable for neck pain, left  shoulder pain and arm pain.   PHYSICAL EXAMINATION:  HEENT:  Exam is within normal limits.  NECK:  He has reasonable range of motion in his neck with a lot of pain with  movement.  CHEST:  Chest is clear.  CARDIAC:  Exam unremarkable.  ABDOMEN:  Abdomen is nontender with no hepatosplenomegaly.  EXTREMITIES:  Extremities without clubbing or cyanosis.  GU:  Exam is deferred.  PERIPHERAL PULSES:  Good.  NEUROLOGIC:  Neurologically, he is awake, alert and oriented.  His cranial  nerves are intact.  Motor exam shows 5/5 strength throughout the upper  extremities at this time.  He does have radicular pain down his left arm  with head movement.  There is no current sensory deficit.  Reflexes are  absent at the left biceps, 1 at the triceps, 1 at the right biceps and 1 at  the right triceps; brachioradialis is 1.  Hoffmann's is negative.   Lower extremities are non-myelopathic.   RADIOGRAPHIC RESULTS:  His radiographic results have been reviewed above and  basically  demonstrate disruption of graft at C5-6.  He has a plate at Z6-1  from an anterior cervical done several years ago.   PLAN:  The plan is to remove the plate at 6-7 to allow access for plating  from C5 to C6 and revise the bone graft at 5-6.  The risks and benefits of  this approach have been discussed with him and he wishes to proceed.                                               Payton Doughty, M.D.    MWR/MEDQ  D:  01/12/2003  T:  01/12/2003  Job:  [id number]

## 2011-02-09 NOTE — Op Note (Signed)
NAME:  KUPER, RENNELS                         ACCOUNT NO.:  [id number]   MEDICAL RECORD NO.:  [id number]                   PATIENT TYPE:  INP   LOCATION:  3011                                 FACILITY:  [facility]   PHYSICIAN:  Payton Doughty, M.D.                   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   PROCEDURE:  Anterior cervical approach for removal of a C6-7 A-line plate  and revision of the anterior cervical fusion at C5-6.   SURGEON:  Payton Doughty, M.D.   ASSISTANTS:  Nurse assistant:  Basilia Jumbo.  Doctor assistant:  Clydene Fake, M.D.   ANESTHESIA:  General endotracheal.   PREPARATION:  Sterile Betadine prep and scrub with alcohol wipe.   COMPLICATIONS:  None.   DESCRIPTION OF PROCEDURE:  This is a 55 year old gentleman.  He had a 6-7  disk done numerous years ago and done well.  He had a 5-6 disk done and has  a traumatic nonunion and is now admitted for revision.  He was taken to the  operating room and smoothly anesthetized and intubated, placed supine on the  Holter head traction with the neck slightly extended.  Following shave,  prep, and drape in the usual sterile fashion, the skin was incised  paralleling the sternocleidomastoid muscle starting three fingerbreadths  below the carotid tubercle and extending to one fingerbreadth above the  carotid tubercle.  His two previous old incisions were _________ dissection.  The platysma was identified, elevated, divided, and undermined.  The  sternocleidomastoid was then identified and medial dissection revealed the  carotid artery.  Working just medial to the carotid artery sheath, the  prevertebral space was entered posterior to the esophagus, and careful  dissection of the scar along this allowed mobilization of the  esophagus  enveloped in scar from the prevertebral space.  At no time was there  perforation of the esophagus, and careful observation and inspection of the  esophagus was maintained throughout the case.  The 6-7 fusion was uncovered  and the screws removed and the plate removed without difficulty.  There was  solid fusion underneath it.  At C5-6 there were obvious bone fragments lying  in the scar in the disk space.  These were gently curetted back to bone.  The operating microscope was then brought in and microdissection technique  was used to remove bony fragments from the anterior epidural space.  Now  removal of one of these fragments with the attendant scar caused a small CSF  leak.  This was covered with Duragen.  The anterior epidural space was  carefully inspected and found to be free of bony fragments.  A 7 mm bone  graft  was then fashioned from patellar allograft and tapped into place.  A  16 mm Tether plate was then placed with 13 mm screws, two in C5, two in C6.  Intraoperative x-ray showed good placement of bone graft, plate, and screws.  The wound was irrigated and hemostasis assured, and there was no evidence of  CSF leak.  The esophagus was carefully inspected and found to be free.  The  platysma was reapproximated with 3-0 Vicryl in interrupted fashion, the  subcutaneous tissue was reapproximated with 3-0 Vicryl  in interrupted fashion.  The skin was closed with 4-0 Vicryl in a running  subcuticular fashion.  Benzoin and Steri-Strips were placed and made  occlusive with Telfa and OpSite, and the patient returned to the recovery  room in good condition.                                               Payton Doughty, M.D.    MWR/MEDQ  D:  01/12/2003  T:  01/12/2003  Job:  [id number]

## 2011-05-15 ENCOUNTER — Other Ambulatory Visit (HOSPITAL_COMMUNITY): Payer: Self-pay | Admitting: Pulmonary Disease

## 2011-05-15 DIAGNOSIS — M549 Dorsalgia, unspecified: Secondary | ICD-10-CM

## 2011-05-17 ENCOUNTER — Ambulatory Visit (HOSPITAL_COMMUNITY)
Admission: RE | Admit: 2011-05-17 | Discharge: 2011-05-17 | Disposition: A | Discharge status: Home / Self Care | Payer: 59 | Source: Ambulatory Visit | Attending: Pulmonary Disease | Admitting: Pulmonary Disease

## 2011-05-17 DIAGNOSIS — M545 Low back pain, unspecified: Secondary | ICD-10-CM | POA: Insufficient documentation

## 2011-05-17 DIAGNOSIS — R209 Unspecified disturbances of skin sensation: Secondary | ICD-10-CM | POA: Insufficient documentation

## 2011-05-17 DIAGNOSIS — Z981 Arthrodesis status: Secondary | ICD-10-CM | POA: Insufficient documentation

## 2011-05-17 DIAGNOSIS — M549 Dorsalgia, unspecified: Secondary | ICD-10-CM

## 2011-06-18 LAB — COMPREHENSIVE METABOLIC PANEL
Alkaline Phosphatase: 96
BUN: 4 — ABNORMAL LOW
Chloride: 106
GFR calc non Af Amer: >60
Glucose, Bld: 105 — ABNORMAL HIGH
Potassium: 3.5
Total Bilirubin: 0.4

## 2011-06-18 LAB — URINALYSIS, ROUTINE W REFLEX MICROSCOPIC
Glucose, UA: NEGATIVE
Ketones, ur: NEGATIVE
pH: 7

## 2011-06-18 LAB — LIPASE, BLOOD: Lipase: 28

## 2011-06-18 LAB — CBC
HCT: 42
Hemoglobin: 14.6
RDW: 13.9

## 2011-06-18 LAB — DIFFERENTIAL
Neutro Abs: 6.3
Neutrophils Relative %: 66

## 2011-06-20 LAB — URINALYSIS, ROUTINE W REFLEX MICROSCOPIC
Bilirubin Urine: NEGATIVE
Glucose, UA: NEGATIVE
Ketones, ur: NEGATIVE
Nitrite: NEGATIVE
Specific Gravity, Urine: <1.005 — ABNORMAL LOW
pH: 6.5

## 2011-06-20 LAB — COMPREHENSIVE METABOLIC PANEL
AST: 22
Albumin: 3.4 — ABNORMAL LOW
Alkaline Phosphatase: 103
BUN: 8
CO2: 21
Chloride: 109
GFR calc non Af Amer: >60
Potassium: 3.8
Total Bilirubin: 0.6

## 2011-06-20 LAB — CBC
HCT: 44
Platelets: 205
RBC: 5.04
WBC: 12.9 — ABNORMAL HIGH

## 2011-06-20 LAB — URINE MICROSCOPIC-ADD ON

## 2011-06-20 LAB — DIFFERENTIAL
Basophils Absolute: 0.1
Basophils Relative: 0
Eosinophils Relative: 1
Monocytes Absolute: 0.7
Neutro Abs: 9.5 — ABNORMAL HIGH

## 2011-06-27 ENCOUNTER — Encounter: Payer: Self-pay | Admitting: Cardiology

## 2011-07-04 LAB — CBC
MCHC: 34.8
MCV: 89
Platelets: 233
RBC: 4.58

## 2011-07-04 LAB — DIFFERENTIAL
Eosinophils Absolute: 0.4
Lymphs Abs: 2.3
Monocytes Relative: 8
Neutro Abs: 9.3 — ABNORMAL HIGH
Neutrophils Relative %: 71

## 2011-07-04 LAB — BASIC METABOLIC PANEL
BUN: 4 — ABNORMAL LOW
CO2: 28
Chloride: 106
Creatinine, Ser: 0.6
Glucose, Bld: 107 — ABNORMAL HIGH

## 2011-07-04 LAB — BLOOD GAS, ARTERIAL
Acid-base deficit: 0.3
Bicarbonate: 23.9
Patient temperature: 37

## 2011-07-04 LAB — D-DIMER, QUANTITATIVE: D-Dimer, Quant: 0.31

## 2011-10-04 ENCOUNTER — Other Ambulatory Visit (HOSPITAL_COMMUNITY): Payer: Self-pay | Admitting: Anesthesiology

## 2011-10-04 DIAGNOSIS — M542 Cervicalgia: Secondary | ICD-10-CM

## 2011-10-08 ENCOUNTER — Ambulatory Visit (HOSPITAL_COMMUNITY)
Admission: RE | Admit: 2011-10-08 | Discharge: 2011-10-08 | Disposition: A | Discharge status: Home / Self Care | Payer: 59 | Source: Ambulatory Visit | Attending: Anesthesiology | Admitting: Anesthesiology

## 2011-10-08 DIAGNOSIS — M5030 Other cervical disc degeneration, unspecified cervical region: Secondary | ICD-10-CM | POA: Insufficient documentation

## 2011-10-08 DIAGNOSIS — M542 Cervicalgia: Secondary | ICD-10-CM | POA: Insufficient documentation

## 2011-10-08 DIAGNOSIS — R209 Unspecified disturbances of skin sensation: Secondary | ICD-10-CM | POA: Insufficient documentation

## 2011-10-08 DIAGNOSIS — M5020 Other cervical disc displacement, unspecified cervical region: Secondary | ICD-10-CM | POA: Insufficient documentation

## 2012-02-23 ENCOUNTER — Inpatient Hospital Stay (HOSPITAL_COMMUNITY)
Admission: RE | Admit: 2012-02-23 | Discharge: 2012-02-26 | DRG: 897 | Disposition: A | Discharge status: Home / Self Care | Payer: 59 | Attending: Psychiatry | Admitting: Psychiatry

## 2012-02-23 ENCOUNTER — Encounter (HOSPITAL_COMMUNITY): Payer: Self-pay | Admitting: Licensed Clinical Social Worker

## 2012-02-23 DIAGNOSIS — G4700 Insomnia, unspecified: Secondary | ICD-10-CM | POA: Diagnosis present

## 2012-02-23 DIAGNOSIS — F0631 Mood disorder due to known physiological condition with depressive features: Secondary | ICD-10-CM | POA: Diagnosis present

## 2012-02-23 DIAGNOSIS — F1999 Other psychoactive substance use, unspecified with unspecified psychoactive substance-induced disorder: Principal | ICD-10-CM | POA: Diagnosis present

## 2012-02-23 DIAGNOSIS — F17200 Nicotine dependence, unspecified, uncomplicated: Secondary | ICD-10-CM | POA: Diagnosis present

## 2012-02-23 DIAGNOSIS — F1210 Cannabis abuse, uncomplicated: Secondary | ICD-10-CM | POA: Diagnosis present

## 2012-02-23 DIAGNOSIS — F411 Generalized anxiety disorder: Secondary | ICD-10-CM

## 2012-02-23 DIAGNOSIS — F329 Major depressive disorder, single episode, unspecified: Secondary | ICD-10-CM

## 2012-02-23 DIAGNOSIS — E785 Hyperlipidemia, unspecified: Secondary | ICD-10-CM | POA: Diagnosis present

## 2012-02-23 DIAGNOSIS — F1994 Other psychoactive substance use, unspecified with psychoactive substance-induced mood disorder: Secondary | ICD-10-CM | POA: Diagnosis present

## 2012-02-23 DIAGNOSIS — T40605A Adverse effect of unspecified narcotics, initial encounter: Secondary | ICD-10-CM | POA: Diagnosis present

## 2012-02-23 DIAGNOSIS — Z634 Disappearance and death of family member: Secondary | ICD-10-CM

## 2012-02-23 DIAGNOSIS — Z8711 Personal history of peptic ulcer disease: Secondary | ICD-10-CM

## 2012-02-23 HISTORY — DX: Depression, unspecified: F32.A

## 2012-02-23 HISTORY — DX: Major depressive disorder, single episode, unspecified: F32.9

## 2012-02-23 LAB — CBC
HCT: 48 % (ref 39.0–52.0)
Hemoglobin: 16.2 g/dL (ref 13.0–17.0)
RBC: 5.41 MIL/uL (ref 4.22–5.81)
WBC: 11.2 10*3/uL — ABNORMAL HIGH (ref 4.0–10.5)

## 2012-02-23 LAB — COMPREHENSIVE METABOLIC PANEL
ALT: 34 U/L (ref 0–53)
AST: 22 U/L (ref 0–37)
Albumin: 3.6 g/dL (ref 3.5–5.2)
Calcium: 9.4 mg/dL (ref 8.4–10.5)
Creatinine, Ser: 0.96 mg/dL (ref 0.50–1.35)
GFR calc non Af Amer: >90 mL/min (ref >90)
Total Protein: 7.1 g/dL (ref 6.0–8.3)

## 2012-02-23 MED ORDER — ACETAMINOPHEN 325 MG PO TABS
650.0000 mg | ORAL_TABLET | Freq: Four times a day (QID) | ORAL | Status: DC | PRN
  Administered 2012-02-24 (×2): 650 mg via ORAL

## 2012-02-23 MED ORDER — DULOXETINE HCL 60 MG PO CPEP
60.0000 mg | ORAL_CAPSULE | Freq: Every day | ORAL | Status: DC
  Administered 2012-02-23 – 2012-02-24 (×2): 60 mg via ORAL
  Filled 2012-02-23 (×6): qty 1

## 2012-02-23 MED ORDER — NICOTINE 21 MG/24HR TD PT24
21.0000 mg | MEDICATED_PATCH | Freq: Every day | TRANSDERMAL | Status: DC
  Administered 2012-02-23 – 2012-02-26 (×4): 21 mg via TRANSDERMAL
  Filled 2012-02-23 (×7): qty 1

## 2012-02-23 MED ORDER — MAGNESIUM HYDROXIDE 400 MG/5ML PO SUSP: 30.0000 mL | Freq: Every day | ORAL | Status: DC | PRN

## 2012-02-23 MED ORDER — TRAZODONE HCL 100 MG PO TABS
100.0000 mg | ORAL_TABLET | Freq: Every day | ORAL | Status: DC
  Administered 2012-02-23 – 2012-02-24 (×2): 100 mg via ORAL
  Filled 2012-02-23 (×4): qty 1

## 2012-02-23 MED ORDER — ALUM & MAG HYDROXIDE-SIMETH 200-200-20 MG/5ML PO SUSP: 30.0000 mL | ORAL | Status: DC | PRN

## 2012-02-23 MED ORDER — LORAZEPAM 1 MG PO TABS
1.0000 mg | ORAL_TABLET | ORAL | Status: DC | PRN
  Administered 2012-02-23 – 2012-02-24 (×3): 1 mg via ORAL
  Filled 2012-02-23 (×3): qty 1

## 2012-02-23 MED ORDER — FENTANYL 25 MCG/HR TD PT72
25.0000 ug | MEDICATED_PATCH | TRANSDERMAL | Status: DC
  Administered 2012-02-25: 25 ug via TRANSDERMAL
  Filled 2012-02-23: qty 1

## 2012-02-23 NOTE — Progress Notes (Signed)
Patient ID: Stephen Porter, male   DOB: 1956-07-06, 56 y.o.   MRN: [id number] 02-23-12 brooks w took this pt from 3pm to 7pm.

## 2012-02-23 NOTE — BH Assessment (Addendum)
Assessment Note   Stephen Porter is an 56 y.o. male, married, white who presents with his wife to [facility] for assessment. Pt reports he has a long history of depression which became worse in 2003 following a neck injury at work resulting in loss of employment. He reports over the past month his depressive symptoms have become increasingly severe. Yesterday and this morning he "had a breakdown" and was sobbing uncontrollably for hours, which greatly concerned his family because Pt is normally rather stoic. Pt reports he has had recurring suicidal thoughts with plan to shoot himself and he does have guns in his home. He also states that even if the guns are removed "I could drive my truck into a tree and line up the steering column so it hits me in the chest and kills me." Pt says normally he can identify these thoughts are irrational but recently has begun to think his family might be better off with the insurance money and that "sometimes suicide seems like the only way out." His depressive symptoms include uncontrollable crying spells, sadness, anhedonia, decreased motivation, decreased hygiene and grooming, poor appetite, low self-esteem and feelings of hopelessness. He reports sleeping an average of 3 hours per night. He describes feeling like a failure due to choices he made in his youth, having a 9th grade education and being unable to work. He states "I am never happy, although I can fake it in front of my family."  Pt denies any history of suicide attempts, homicidal ideation, psychotic symptoms or alcohol or recreational substance abuse. He reports becoming addicted to OxyContin following neck and back surgery in 2003 and in 2009 he went to [facility] for detox, which he describes as being a bad experience. He denies any recent abuse of medication and states he takes his medications as prescribed, which include Cymbalta 60 mg BID and Xanax 1 mg BID. He is currently prescribed a Fentanyl patch 25  mcg Q3 days and is very afraid of being without it. Pt reports no other inpatient or outpatient mental health treatment and   Pt reports his primary stressor is chronic pain in his neck, lower back and hips. He and his wife report his doctor said he needs another surgery and in his current condition if he experiences an impact to the back of his neck it could instantly kill him. The Pt is obviously worried about this condition. He also reports feeling sadness and despair due to his father having dementia and being in a nursing facility. He is also very close with his mother, who is 28 years old, and he worries about her health.   Pt lives with his wife, who is a Charity fundraiser at [employer], and describes her as being very supportive. Pt has two adult daughters, both of whom are married and both are 7 months pregnant. Pt says he knows he has a lot of support but that when he is so severely depressed it doesn't seem to help. He reports his sister and one of his brothers are being treated for depression and he suspects his father was depressed. During assessment Pt appeared very depressed, anxious and sincere in his desire for help.  Axis I: 296.33 Major Depressive Disorder, Recurrent, Severe Without Psychotic Features Axis II: Deferred Axis III:  Past Medical History  Diagnosis Date  . Chest pain   . Tobacco abuse   . Hyperlipidemia   . Anxiety and depression   . Insomnia   . DDD (  degenerative disc disease)   . Jejunal ulcer   . Depression    Axis IV: Chronic pain, grief regarding father Axis V: GAF=30  Past Medical History:  Past Medical History  Diagnosis Date  . Chest pain   . Tobacco abuse   . Hyperlipidemia   . Anxiety and depression   . Insomnia   . DDD (degenerative disc disease)   . Jejunal ulcer   . Depression     Past Surgical History  Procedure Date  . Lumbar spine surgery   . Cervical spine surgery     x3  . Cholecystectomy   . Colonoscopy 2006  . Back surgery      Family History:  Family History  Problem Relation Age of Onset  . Hypertension Sister   . Hypertension Brother   . Hypertension Brother   . Hypertension Brother     Social History:  reports that he has been smoking.  He does not have any smokeless tobacco history on file. He reports that he does not drink alcohol or use illicit drugs.  Additional Social History:  Alcohol / Drug Use Pain Medications: Denies Prescriptions: Denies Over the Counter: Denies History of alcohol / drug use?: No history of alcohol / drug abuse Longest period of sobriety (when/how long): NA  CIWA:   COWS:    Allergies: Allergies no known allergies  Home Medications:  Medications Prior to Admission  Medication Sig Dispense Refill  . ALPRAZolam (XANAX) 1 MG tablet Take 1 mg by mouth at bedtime as needed.        Marland Kitchen atorvastatin (LIPITOR) 40 MG tablet Take 40 mg by mouth daily.        Marland Kitchen desvenlafaxine (PRISTIQ) 50 MG 24 hr tablet Take 50 mg by mouth daily.        . niacin (NIASPAN) 500 MG CR tablet Take 500 mg by mouth at bedtime.        . traMADol (ULTRAM) 50 MG tablet Take 50 mg by mouth as directed.          OB/GYN Status:  No LMP for male patient.  General Assessment Data Location of Assessment: [facility] Assessment Services Living Arrangements: Spouse/significant other Can pt return to current living arrangement?: Yes Admission Status: Voluntary Is patient capable of signing voluntary admission?: Yes Transfer from: Home Referral Source: Self/Family/Friend  Education Status Is patient currently in school?: No  Risk to self Suicidal Ideation: Yes-Currently Present Suicidal Intent: No Is patient at risk for suicide?: Yes Suicidal Plan?: Yes-Currently Present Specify Current Suicidal Plan: Pt has had recurring thoughts of shooting himself Access to Means: Yes Specify Access to Suicidal Means: Pt has guns in the home What has been your use of drugs/alcohol within the last 12 months?: Pt denies  alcohol or substance abuse Previous Attempts/Gestures: No How many times?: 0  Other Self Harm Risks: None Triggers for Past Attempts: None known Intentional Self Injurious Behavior: None Family Suicide History: No Recent stressful life event(s): Other (Comment) (Chronic pain, father has dementia) Persecutory voices/beliefs?: No Depression: Yes Depression Symptoms: Despondent;Insomnia;Tearfulness;Fatigue;Guilt;Loss of interest in usual pleasures;Feeling worthless/self pity Substance abuse history and/or treatment for substance abuse?: Yes Suicide prevention information given to non-admitted patients: Not applicable  Risk to Others Homicidal Ideation: No Thoughts of Harm to Others: No Current Homicidal Intent: No Current Homicidal Plan: No Access to Homicidal Means: No Identified Victim: None History of harm to others?: No Assessment of Violence: None Noted Violent Behavior Description: No history of violence Does patient have  access to weapons?: Yes (Comment) (Pt has guns in the home) Criminal Charges Pending?: No Does patient have a court date: No  Psychosis Hallucinations: None noted Delusions: None noted  Mental Status Report Appear/Hygiene: Other (Comment) (Casually dressed) Eye Contact: Good Motor Activity: Unremarkable Speech: Logical/coherent Level of Consciousness: Alert Mood: Depressed;Anxious;Despair;Sad;Worthless, low self-esteem Affect: Depressed Anxiety Level: None Thought Processes: Coherent;Relevant Judgement: Unimpaired Orientation: Person;Place;Time;Situation Obsessive Compulsive Thoughts/Behaviors: None  Cognitive Functioning Concentration: Normal Memory: Recent Intact;Remote Intact IQ: Average Insight: Good Impulse Control: Good Appetite: Poor Weight Loss: 5  Weight Gain: 0  Sleep: Decreased Total Hours of Sleep: 3  Vegetative Symptoms: Decreased grooming  ADLScreening [facility]) Patient's cognitive ability adequate to  safely complete daily activities?: Yes Patient able to express need for assistance with ADLs?: Yes Independently performs ADLs?: Yes  Abuse/Neglect [facility]) Physical Abuse: Denies Verbal Abuse: Denies Sexual Abuse: Denies  Prior Inpatient Therapy Prior Inpatient Therapy: Yes Prior Therapy Dates: 2009 Prior Therapy Facilty/Provider(s): [facility] Reason for Treatment: Opioid dependence  Prior Outpatient Therapy Prior Outpatient Therapy: Yes Prior Therapy Dates: 2003-current Prior Therapy Facilty/Provider(s): [facility], [facility] Reason for Treatment: Depression  ADL Screening (condition at time of admission) Patient's cognitive ability adequate to safely complete daily activities?: Yes Patient able to express need for assistance with ADLs?: Yes Independently performs ADLs?: Yes Weakness of Legs: None Weakness of Arms/Hands: None  Home Assistive Devices/Equipment Home Assistive Devices/Equipment: None    Abuse/Neglect Assessment (Assessment to be complete while patient is alone) Physical Abuse: Denies Verbal Abuse: Denies Sexual Abuse: Denies Exploitation of patient/patient's resources: Denies Self-Neglect: Denies     Merchant navy officer (For Healthcare) Advance Directive: Patient does not have advance directive;Patient would not like information Pre-existing out of facility DNR order (yellow form or pink MOST form): No Nutrition Screen Diet: Regular Unintentional weight loss greater than 10lbs within the last month: No Problems chewing or swallowing foods and/or liquids: No Home Tube Feeding or Total Parenteral Nutrition (TPN): No Patient appears severely malnourished: No  Additional Information 1:1 In Past 12 Months?: No CIRT Risk: No Elopement Risk: No Does patient have medical clearance?: No     Disposition:  Disposition Disposition of Patient: Inpatient treatment program Type of inpatient treatment program: Adult  On Site Evaluation by:   Reviewed with  Physician: Lynann Bologna, NP    Patsy Baltimore, Harlin Rain 02/23/2012 2:34 PM

## 2012-02-23 NOTE — Progress Notes (Signed)
Patient ID: Stephen Porter, male   DOB: [birth year], 56 y.o.   MRN: [id number] 02-23-12 @ 1515 nursing admission note; this adm is incomplete. This pt states he has been depressed and is having some passive si with no definite plan. He was able to contract for the si on admission and denies any hi/av. He denied any allergies, no pain, wears glasses and stated he has a hx of neck and back surgery. He is a smoker at 2 ppd and rn got an order for a nic patch 21 mg and put it on the pt. He denied any etoh or illegal drug use.  Report was given to chris j, rn...sbw  Emergency contact debra wife at work # [phone] or cell # [phone]

## 2012-02-24 DIAGNOSIS — F329 Major depressive disorder, single episode, unspecified: Secondary | ICD-10-CM

## 2012-02-24 DIAGNOSIS — F0631 Mood disorder due to known physiological condition with depressive features: Secondary | ICD-10-CM | POA: Diagnosis present

## 2012-02-24 LAB — URINALYSIS, ROUTINE W REFLEX MICROSCOPIC
Glucose, UA: NEGATIVE mg/dL
Hgb urine dipstick: NEGATIVE
Leukocytes, UA: NEGATIVE
Protein, ur: NEGATIVE mg/dL
Specific Gravity, Urine: 1.025 (ref 1.005–1.030)

## 2012-02-24 LAB — RAPID URINE DRUG SCREEN, HOSP PERFORMED
Amphetamines: NOT DETECTED
Cocaine: NOT DETECTED
Opiates: NOT DETECTED

## 2012-02-24 MED ORDER — DULOXETINE HCL 60 MG PO CPEP
60.0000 mg | ORAL_CAPSULE | Freq: Two times a day (BID) | ORAL | Status: DC
  Administered 2012-02-25 – 2012-02-26 (×3): 60 mg via ORAL
  Filled 2012-02-24: qty 2
  Filled 2012-02-24 (×2): qty 1
  Filled 2012-02-24: qty 2
  Filled 2012-02-24 (×3): qty 1

## 2012-02-24 MED ORDER — DULOXETINE HCL 60 MG PO CPEP
60.0000 mg | ORAL_CAPSULE | Freq: Once | ORAL | Status: AC
  Administered 2012-02-24: 60 mg via ORAL
  Filled 2012-02-24: qty 1

## 2012-02-24 NOTE — Progress Notes (Signed)
[facility] Adult Inpatient Family/Significant Other Suicide Prevention Education  Suicide Prevention Education:  Contact Attempts: Debra Iddings-[phone]-Pt.'s wife- has been identified by the patient as the family member/significant other with whom the patient will be residing, and identified as the person(s) who will aid the patient in the event of a mental health crisis.  With written consent from the patient, two attempts were made to provide suicide prevention education, prior to and/or following the patient's discharge.  We were unsuccessful in providing suicide prevention education.  A suicide education pamphlet was given to the patient to share with family/significant other.  Date and time of first attempt: on 02/24/12 at 4:13 pm by Mitzie Na Date and time of second attempt:  Neila Gear 02/24/2012, 4:12 PM

## 2012-02-24 NOTE — [facility] Suicide Risk Assessment (Signed)
Suicide Risk Assessment  Admission Assessment     Demographic factors:   34 married male Current Mental Status:   see below Loss Factors:   unepmployed, medical issues, past injuries, chronic pain  Historical Factors:   no attempts Risk Reduction Factors:   married, good family support  CLINICAL FACTORS:   Depression:   Anhedonia Recent sense of peace/wellbeing  COGNITIVE FEATURES THAT CONTRIBUTE TO RISK:  Closed-mindedness    SUICIDE RISK:   Mild:  Suicidal ideation of limited frequency, intensity, duration, and specificity.  There are no identifiable plans, no associated intent, mild dysphoria and related symptoms, good self-control (both objective and subjective assessment), few other risk factors, and identifiable protective factors, including available and accessible social support.  PLAN OF CARE:   Mental Status Examination/Evaluation:  Objective: Appearance: Disheveled   Psychomotor Activity: Normal   Eye Contact:: Good   Speech: Clear and Coherent and Normal Rate   Volume: Normal   Mood: depressed   Affect: Depressed   Thought Process:   Orientation: Full   Thought Content: No AVH/psychois -    Suicidal Thoughts: Yes. with intent/plan   Homicidal Thoughts: No   Judgement: Impaired   Insight: Fair   DIAGNOSIS:  AXIS I  Depressive d/o nos, r/o opiod dep   AXIS II  No diagnosis   AXIS III  See medical history.   AXIS IV  other psychosocial or environmental problems and problems related to social environment   AXIS V  25    Treatment Plan Summary:  Admit for safety & stabilization  Will  adjust meds as needed. Primary team can consider adding abilify or seroquel for that. Recommended out pt pain mangement after discharge   Wonda Cerise 02/24/2012, 6:30 PM

## 2012-02-24 NOTE — H&P (Signed)
Stephen Porter is an 56 y.o. male.   Chief Complaint: Uncontrollable cryring and SI HPI: Please see [facility] & [facility] assessment for further details   Past Medical History  Diagnosis Date  . Chest pain   . Tobacco abuse   . Hyperlipidemia   . Anxiety and depression   . Insomnia   . DDD (degenerative disc disease)   . Jejunal ulcer   . Depression     Past Surgical History  Procedure Date  . Lumbar spine surgery   . Cervical spine surgery     x3  . Cholecystectomy   . Colonoscopy 2006  . Back surgery     Family History  Problem Relation Age of Onset  . Hypertension Sister   . Hypertension Brother   . Hypertension Brother   . Hypertension Brother    Social History:  reports that he has been smoking.  He does not have any smokeless tobacco history on file. He reports that he does not drink alcohol or use illicit drugs.  Allergies: No Known Allergies  Medications Prior to Admission  Medication Sig Dispense Refill  . DULoxetine (CYMBALTA) 60 MG capsule Take 60 mg by mouth 2 (two) times daily.      . fentaNYL (DURAGESIC - DOSED MCG/HR) 100 MCG/HR Place 1 patch onto the skin every 3 (three) days.      Marland Kitchen oxyCODONE (OXY IR/ROXICODONE) 5 MG immediate release tablet Take 15 mg by mouth every 6 (six) hours as needed.      . ALPRAZolam (XANAX) 1 MG tablet Take 1 mg by mouth at bedtime as needed.        Marland Kitchen atorvastatin (LIPITOR) 40 MG tablet Take 40 mg by mouth daily.        Marland Kitchen desvenlafaxine (PRISTIQ) 50 MG 24 hr tablet Take 50 mg by mouth daily.        . niacin (NIASPAN) 500 MG CR tablet Take 500 mg by mouth at bedtime.        . traMADol (ULTRAM) 50 MG tablet Take 50 mg by mouth as directed.          Results for orders placed during the hospital encounter of 02/23/12 (from the past 48 hour(s))  CBC     Status: Abnormal   Collection Time   02/23/12  7:45 PM      Component Value Range Comment   WBC 11.2 (*) 4.0 - 10.5 (K/uL)    RBC 5.41  4.22 - 5.81 (MIL/uL)    Hemoglobin 16.2  13.0 -  17.0 (g/dL)    HCT 65.7  84.6 - 96.2 (%)    MCV 88.7  78.0 - 100.0 (fL)    MCH 29.9  26.0 - 34.0 (pg)    MCHC 33.8  30.0 - 36.0 (g/dL)    RDW 95.2  84.1 - 32.4 (%)    Platelets 243  150 - 400 (K/uL)   COMPREHENSIVE METABOLIC PANEL     Status: Normal   Collection Time   02/23/12  7:45 PM      Component Value Range Comment   Sodium 139  135 - 145 (mEq/L)    Potassium 4.4  3.5 - 5.1 (mEq/L)    Chloride 103  96 - 112 (mEq/L)    CO2 29  19 - 32 (mEq/L)    Glucose, Bld 89  70 - 99 (mg/dL)    BUN 9  6 - 23 (mg/dL)    Creatinine, Ser 4.01  0.50 - 1.35 (mg/dL)  Calcium 9.4  8.4 - 10.5 (mg/dL)    Total Protein 7.1  6.0 - 8.3 (g/dL)    Albumin 3.6  3.5 - 5.2 (g/dL)    AST 22  0 - 37 (U/L)    ALT 34  0 - 53 (U/L)    Alkaline Phosphatase 106  39 - 117 (U/L)    Total Bilirubin 0.4  0.3 - 1.2 (mg/dL)    GFR calc non Af Amer >90  >90 (mL/min)    GFR calc Af Amer >90  >90 (mL/min)   TSH     Status: Normal   Collection Time   02/23/12  7:45 PM      Component Value Range Comment   TSH 0.959  0.350 - 4.500 (uIU/mL)   URINALYSIS, ROUTINE W REFLEX MICROSCOPIC     Status: Abnormal   Collection Time   02/23/12  8:41 PM      Component Value Range Comment   Color, Urine YELLOW  YELLOW     APPearance CLOUDY (*) CLEAR     Specific Gravity, Urine 1.025  1.005 - 1.030     pH 5.5  5.0 - 8.0     Glucose, UA NEGATIVE  NEGATIVE (mg/dL)    Hgb urine dipstick NEGATIVE  NEGATIVE     Bilirubin Urine NEGATIVE  NEGATIVE     Ketones, ur NEGATIVE  NEGATIVE (mg/dL)    Protein, ur NEGATIVE  NEGATIVE (mg/dL)    Urobilinogen, UA 0.2  0.0 - 1.0 (mg/dL)    Nitrite NEGATIVE  NEGATIVE     Leukocytes, UA NEGATIVE  NEGATIVE  MICROSCOPIC NOT DONE ON URINES WITH NEGATIVE PROTEIN, BLOOD, LEUKOCYTES, NITRITE, OR GLUCOSE <1000 mg/dL.  URINE RAPID DRUG SCREEN (HOSP PERFORMED)     Status: Abnormal   Collection Time   02/23/12  8:41 PM      Component Value Range Comment   Opiates NONE DETECTED  NONE DETECTED     Cocaine NONE  DETECTED  NONE DETECTED     Benzodiazepines POSITIVE (*) NONE DETECTED     Amphetamines NONE DETECTED  NONE DETECTED     Tetrahydrocannabinol POSITIVE (*) NONE DETECTED     Barbiturates NONE DETECTED  NONE DETECTED     No results found.  Review of Systems  Constitutional: Positive for malaise/fatigue.  HENT: Positive for neck pain.   Eyes: Negative.   Respiratory: Negative.   Cardiovascular: Negative.        Has lipidemia has been evaluated for chest pain in the past  Gastrointestinal: Negative.   Genitourinary: Negative.   Musculoskeletal: Positive for back pain.       Neck surgery 2002& 2003  Back surgery 2005   Skin: Negative.   Neurological: Positive for weakness.  Endo/Heme/Allergies: Negative.   Psychiatric/Behavioral: Positive for depression and suicidal ideas.    Blood pressure 116/77, pulse 87, temperature 98 F (36.7 C), temperature source Oral, resp. rate 18, height 5\' 9"  (1.753 m), weight 93.441 kg (206 lb), SpO2 97.00%. Physical Exam  Constitutional: He is oriented to person, place, and time. He appears well-developed and well-nourished.  HENT:  Head: Normocephalic and atraumatic.  Eyes: Pupils are equal, round, and reactive to light.  Neck: Neck supple.       Pain limits neck motion   Cardiovascular: Normal rate, regular rhythm and normal heart sounds.   Respiratory: Effort normal and breath sounds normal.  GI: Soft. Bowel sounds are normal.  Musculoskeletal:       Decreased ROM due to DDD in  cervical and lumbar areas  Neurological: He is alert and oriented to person, place, and time.  Skin: Skin is warm and dry.  Psychiatric: He has a normal mood and affect. His behavior is normal. Thought content normal.       Has taken Cymbalta for awhile. Is traumatized by parents decline.      Assessment/Plan Depression chronic pain and bereavement  Will let Dr.[facility] adjust meds Monday    Veronia Laprise,MICKIE D. 02/24/2012, 9:18 AM

## 2012-02-24 NOTE — H&P (Signed)
  Pt was seen by me today and I agree with the key elements documented in H&P.  

## 2012-02-24 NOTE — Progress Notes (Signed)
Explained to pt that he would be seen by the primary psychiatrist on Monday who would reevaluate his medications at that time. C/O severe diffuse discomfort and emotional support offered. Antianxiety medications given. Ongoing support and 15' checks cont for safety.

## 2012-02-24 NOTE — Progress Notes (Signed)
Met with pt 1:1 who inquired about his meds. States his cymbalta should be 60mg  po BID rather than daily. He also states he takes roxicodone 15mg  po TID. States his pain is a 7/10 at this time but also states his pain is fairly well controlled with his fentanyl patch (which is in place). Pt expresses a good amount of anxiety about changes to his pain meds though seems to accept the roxicodone has not been continued at this time. "I have had withdrawal from my pain meds before. I do not ever want to go through that again. I'm not here for that. I'm here for my depression." Shared with this RN a picture of his wife and daughters whom he states are both due with his first grandkids on the same day in August. He is given ativan 1mg  po prn for his anxiety and contracts for safety at this time. No HI/AVH. Lawrence Marseilles

## 2012-02-24 NOTE — Progress Notes (Signed)
[facility] Group Notes:  (Counselor/Nursing/MHT/Case Management/Adjunct)  02/24/2012 6:20 PM  Type of Therapy:  Group Therapy  Participation Level:  Did Not Attend    Neila Gear 02/24/2012, 6:20 PM

## 2012-02-24 NOTE — Progress Notes (Signed)
Pt states today has not been as good a day as yesterday primarily because of a nagging headache. Pt wonders if not receiving his cymbalta BID yesterday and today has caused this. Also states he is used to significant nicotine use but is committed to quitting tobacco. Asking for second cymbalta dose.  MD paged, order received to give second cymbalta dose (BID dosing will start tomorrow).  States he had visitors today which brightened his day. Continues to speak of significant support from loved ones. Attended evening group. Denies SI/HI/AVH and remains safe. Lawrence Marseilles

## 2012-02-24 NOTE — Progress Notes (Signed)
Pt appropriate this am. Is concerned about continuation of narcotic pain meds and states "I do not want to go through withdrawals, I will check out first."  Instructed to be clear with provider about home medications and discussed options. Support offered. Denies SI and contracts for safety.  Confirms needed extensive help for ongoing depressive symptoms.  Compliant with scheduled medications.  Cont current POC and 15' checks for safety.

## 2012-02-24 NOTE — H&P (Signed)
Psychiatric Admission Assessment Adult  Patient Identification:  Stephen Porter Date of Evaluation:  02/24/2012 56yo MWM CC: uncontrollable sobbing and suicidal ideation  History of Present Illness: Crying uncontrollably Friday and Saturday and voicing suicidal ideation. Wife an Charity fundraiser at [employer] quite concerned as he is usually quite stoic. He can usually talk himself down but lately "just wants to leave here". Became unemployed 2003 after a work injury. Had neck surgery 2002 & 2003. Back surgery 2005. The back surgery left him addicted to pain meds and required a detox at [facility] in 2009.  [facility] prescribes Cymbalta  Pain doc prescribes other meds. Dr. Raeanne Barry Neurological  His father whom he is very close too is very slowly succumbing to dementia and his mother is starting to have sundowning. Patient hates that his brain wants to do things but his body can't.   Past Psychiatric History: Opiate detox 2009 [facility]   Substance Abuse History: UDS +THC smokes with friends occasionally  Social History:    reports that he has been smoking.  He does not have any smokeless tobacco history on file. He reports that he does not drink alcohol or use illicit drugs. 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 Briefly married at age 73 for a few months. This marriage was 93 years 2 daughters aged 10 & 75 both married and have same due date in August. Wife is Charity fundraiser at [employer].  Family Psych History: Father has dementia Mother recently sundowning and prescribed something. He lives the closest and mother has called at 1-2 am that someone was trying to break in - he has found her sitting at her home with a shotgun at these times. Father's body is contracting he can't do ADL's had to go to Nursing Home -does still recognize patient when he visits.    Past Medical History:     Past Medical History  Diagnosis Date  . Chest pain   . Tobacco  abuse   . Hyperlipidemia   . Anxiety and depression   . Insomnia   . DDD (degenerative disc disease)   . Jejunal ulcer   . Depression        Past Surgical History  Procedure Date  . Lumbar spine surgery   . Cervical spine surgery     x3  . Cholecystectomy   . Colonoscopy 2006  . Back surgery    Lost employment after work place injury 2003 requiring neck surgery.  Allergies: No Known Allergies  Current Medications:  Prior to Admission medications   Medication Sig Start Date End Date Taking? Authorizing Provider  DULoxetine (CYMBALTA) 60 MG capsule Take 60 mg by mouth 2 (two) times daily.   Yes Historical Provider, MD  fentaNYL (DURAGESIC - DOSED MCG/HR) 100 MCG/HR Place 1 patch onto the skin every 3 (three) days.   Yes Gwynne Edinger, MD  oxyCODONE (OXY IR/ROXICODONE) 5 MG immediate release tablet Take 15 mg by mouth every 6 (six) hours as needed.   Yes Historical Provider, MD  ALPRAZolam Prudy Feeler) 1 MG tablet Take 1 mg by mouth at bedtime as needed.      Historical Provider, MD  atorvastatin (LIPITOR) 40 MG tablet Take 40 mg by mouth daily.      Historical Provider, MD  desvenlafaxine (PRISTIQ) 50 MG 24 hr tablet Take 50 mg by mouth daily.      Historical Provider, MD  niacin (NIASPAN) 500 MG CR  tablet Take 500 mg by mouth at bedtime.      Historical Provider, MD  traMADol (ULTRAM) 50 MG tablet Take 50 mg by mouth as directed.      Historical Provider, MD   Gets meds at [facility] not open unable to verify  Mental Status Examination/Evaluation: Objective:  Appearance: Disheveled  Psychomotor Activity:  Normal  Eye Contact::  Good  Speech:  Clear and Coherent and Normal Rate  Volume:  Normal  Mood: depressed   Affect:  Depressed  Thought Process:    Orientation:  Full  Thought Content:  No AVH/psychois - can get into negative thinking  Suicidal Thoughts:  Yes.  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  AXIS IV other psychosocial or environmental problems and problems related to social environment  AXIS V 31-40 impairment in reality testing     Treatment Plan Summary: Admit for safety & stabilization  Let Dr.Walker adjust meds on Monday.

## 2012-02-24 NOTE — BHH Counselor (Signed)
Adult Comprehensive Assessment  Patient ID: PLEZ BELTON, male   DOB: 05/06/1956, 56 y.o.   MRN: [id number]  Information Source: Information source: Patient  Current Stressors:  Educational / Learning stressors: 9th grade Employment / Job issues: on disability-for 7 yeasrs due to injury at work Family Relationships: Pt. reports no problems Surveyor, quantity / Lack of resources (include bankruptcy): pt. states he worries about finances Housing / Lack of housing: Pt. reports no problems Physical health (include injuries & life threatening diseases): Pt. 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 Pt. has pain patch and recently got of oxicodine through [facility] Social relationships: Pt. does not report any problems Substance abuse: Pt denies any problems Bereavement / Loss: Pt. does not report any problems  Living/Environment/Situation:  Living Arrangements: Spouse/significant other Living conditions (as described by patient or guardian): Pt. describes living conditions as happy and comfortable How long has patient lived in current situation?: 26 years What is atmosphere in current home: Comfortable;Loving;Supportive  Family History:  Marital status: Married Number of Years Married: 26  What types of issues is patient dealing with in the relationship?: No problems Additional relationship information: No Problems Does patient have children?: Yes How many children?: 2  (daughters ages 47 and 38) How is patient's relationship with their children?: Pt. reports he is close to his children  Childhood History:  By whom was/is the patient raised?: Both parents Additional childhood history information: Pt. reports a happy childhood Description of patient's relationship with caregiver when they were a child: Pt. was close to parents Patient's description of current relationship with people who raised him/her: Pt. is still close to parents Does patient have  siblings?: Yes Number of Siblings: 3  (3 brothers and 1 sister) Description of patient's current relationship with siblings: Pt. reports he is close to his siblings Did patient suffer any verbal/emotional/physical/sexual abuse as a child?: No Did patient suffer from severe childhood neglect?: No Has patient ever been sexually abused/assaulted/raped as an adolescent or adult?: No Was the patient ever a victim of a crime or a disaster?: No Witnessed domestic violence?: No Has patient been effected by domestic violence as an adult?: No  Education:  Highest grade of school patient has completed:  9th grade Currently a student?: No Learning disability?: Yes What learning problems does patient have?: Problems with reading  Employment/Work Situation:   Employment situation: On disability Why is patient on disability: Due to work injury How long has patient been on disability: 7 years Patient's job has been impacted by current illness: No (on disability) What is the longest time patient has a held a job?: 12 years Where was the patient employed at that time?: Engineer, materials, [place]. Has patient ever been in the [employer]?: Yes (Describe in comment) Garment/textile technologist) Has patient ever served in combat?: No  Financial Resources:   Surveyor, quantity resources: Pharmacologist Does patient have a Lawyer or guardian?: No  Alcohol/Substance Abuse:   What has been your use of drugs/alcohol within the last 12 months?: Pt. denies alchol or substance abuse If attempted suicide, did drugs/alcohol play a role in this?: No Alcohol/Substance Abuse Treatment Hx: Past Tx, Inpatient If yes, describe treatment: [facility] Has alcohol/substance abuse ever caused legal problems?: No  Social Support System:   Conservation officer, nature Support System: Good Describe Community Support System: Pt. reports a good support system Type of faith/religion: Baptist How does patient's  faith help to cope with current illness?: Pt. prays to  God  Leisure/Recreation:   Leisure and Hobbies: spending time with dogs, gardening, and fishing  Strengths/Needs:   What things does the patient do well?: Pt. reports he is good at numbers and moling sheet metal into what he wants. Carpeting,  In what areas does patient struggle / problems for patient: Depression, and the pain from he jnjury.  Discharge Plan:   Does patient have access to transportation?: Yes (Wife or daughter will pick up.) Will patient be returning to same living situation after discharge?: Yes Currently receiving community mental health services: No If no, would patient like referral for services when discharged?: Yes (What county?) ([place] Does patient have financial barriers related to discharge medications?: No (Depends on how it is .) Patient description of barriers related to discharge medications: Deprends on how much it cost.  Summary/Recommendations:   Summary and Recommendations (to be completed by the evaluator):  Pt. admited  fordiagnosis of Other Depression due to general(Principlae Problem. Pt. reports stress. and finacial  poblrms for his  drpression and SI thoughts. Pt. reporst the loss of the job and being on disability as also part of factors in the depression. Pt. is seen by therapist  Alanson Puls and Dr. Teodora Medici Ah-tey .Pt. rrecommendations include: Crisisi stablization, case mangement, group therphy, and Medication mangement.  Jady Braggs [place]. 02/24/2012

## 2012-02-25 DIAGNOSIS — F1994 Other psychoactive substance use, unspecified with psychoactive substance-induced mood disorder: Secondary | ICD-10-CM

## 2012-02-25 DIAGNOSIS — F17200 Nicotine dependence, unspecified, uncomplicated: Secondary | ICD-10-CM

## 2012-02-25 DIAGNOSIS — F1910 Other psychoactive substance abuse, uncomplicated: Secondary | ICD-10-CM

## 2012-02-25 DIAGNOSIS — F1120 Opioid dependence, uncomplicated: Secondary | ICD-10-CM

## 2012-02-25 HISTORY — DX: Other psychoactive substance use, unspecified with psychoactive substance-induced mood disorder: F19.94

## 2012-02-25 MED ORDER — GABAPENTIN 100 MG PO CAPS
100.0000 mg | ORAL_CAPSULE | Freq: Three times a day (TID) | ORAL | Status: AC
  Administered 2012-02-25 (×2): 100 mg via ORAL
  Filled 2012-02-25 (×3): qty 1

## 2012-02-25 MED ORDER — GABAPENTIN 600 MG PO TABS
300.0000 mg | ORAL_TABLET | Freq: Four times a day (QID) | ORAL | Status: DC
  Filled 2012-02-25: qty 0.5

## 2012-02-25 MED ORDER — LIDOCAINE 5 % EX PTCH
2.0000 | MEDICATED_PATCH | CUTANEOUS | Status: DC
  Administered 2012-02-26: 2 via TRANSDERMAL
  Filled 2012-02-25 (×3): qty 2

## 2012-02-25 MED ORDER — GABAPENTIN 300 MG PO CAPS
300.0000 mg | ORAL_CAPSULE | Freq: Three times a day (TID) | ORAL | Status: DC
  Administered 2012-02-26 (×2): 300 mg via ORAL
  Filled 2012-02-25: qty 1
  Filled 2012-02-25 (×2): qty 4
  Filled 2012-02-25: qty 1
  Filled 2012-02-25: qty 4
  Filled 2012-02-25 (×3): qty 1
  Filled 2012-02-25: qty 4

## 2012-02-25 MED ORDER — DICLOFENAC SODIUM 1 % TD GEL
Freq: Four times a day (QID) | TRANSDERMAL | Status: AC
  Administered 2012-02-25: 17:00:00 via TOPICAL
  Administered 2012-02-25: 1 via TOPICAL
  Administered 2012-02-25: 11:00:00 via TOPICAL
  Filled 2012-02-25: qty 100

## 2012-02-25 MED ORDER — AMITRIPTYLINE HCL 25 MG PO TABS
25.0000 mg | ORAL_TABLET | Freq: Every day | ORAL | Status: DC
  Administered 2012-02-25: 25 mg via ORAL
  Filled 2012-02-25 (×3): qty 1

## 2012-02-25 MED ORDER — TRAZODONE HCL 100 MG PO TABS
100.0000 mg | ORAL_TABLET | Freq: Every evening | ORAL | Status: DC | PRN
  Administered 2012-02-25: 100 mg via ORAL
  Filled 2012-02-25 (×2): qty 1

## 2012-02-25 NOTE — Progress Notes (Addendum)
Patient reports sleeping well last night, appetite good, energy level normal, ability to pay attention improving. Rates depression at 1/10 and hopeless 1/10. Denies SI/HI or psychosis. He did share that he had episodes of light headedness and back pain over the last 24 hours. Patient started on Voltaren cream today which was applies topically for back and neck discomfort. Patient shared that he regrets that he is not able to work at a job that he enjoyed prior to his job injury. Patient forward thinking talking about his son and daughter who are both expecting his first grand children later this year. No complaints today. Joice Lofts RN MS EdS 02/25/2012  3:58 PM

## 2012-02-25 NOTE — Tx Team (Signed)
Interdisciplinary Treatment Plan Update (Adult)  Date: 02/25/2012  Time Reviewed: 10:20 AM  Progress in Treatment:  Attending groups: Yes  Participating in groups: Yes  Taking medication as prescribed: Yes  Tolerating medication: Yes  Family/Significant othe contact made: Counselor will make contact with wife Patient understands diagnosis: Yes  Discussing patient identified problems/goals with staff: Yes  Medical problems stabilized or resolved: Yes  Denies suicidal/homicidal ideation: Yes  Issues/concerns per patient self-inventory: None identified  Other: N/A  New problem(s) identified: None Identified  Reason for Continuation of Hospitalization:  Anxiety  Depression  Medication stabilization   Interventions implemented related to continuation of hospitalization: mood stabilization, medication monitoring and adjustment, group therapy and psycho education, safety checks q 15 mins  Additional comments: dr is making a lot of changes with meds today Estimated length of stay:  Discharge Plan: Pt will follow up with [facility] for medication management and therapy.  New goal(s): N/A   Review of initial/current patient goals per problem list:  1. Goal(s): Reduce depressive symptoms  Met: No  Target date: by discharge  As evidenced by: Reducing depression from a 10 to a 3 as reported by pt. Pt ranks at an 8 today.  2. Goal (s): Eliminate Suicidal Ideation  Met: No  Target date: by discharge  As evidenced by: Eliminate suicidal ideation.  3. Goal(s): Reduce anxiety symptoms  Met: No  Target date: by discharge  As evidenced by: Reducing anxiety from a 10 to a 3 as reported by pt. Pt ranks at a 10 today.  Attendees:  Patient:    Family:    Physician: Orson Aloe, MD 02/25/2012 10:20 AM   Nursing: Faythe Dingwall, RN 02/25/2012 10:40 AM   Case Manager: Reyes Ivan, LCSWA  02/25/2012 10:20 AM   Counselor: Angus Palms, LCSW 02/25/2012 10:20 AM   Other: Juline Patch, LCSW  02/25/2012 10:20 AM   Other: Corinne Ports, Psyc intern 02/25/2012 10:40 AM   Other:    Other:    Scribe for Treatment Team:  Carmina Miller, 02/25/2012, 10:20 AM

## 2012-02-25 NOTE — Progress Notes (Signed)
Patient appropriate and cooperative upon my assessment. Patient active in groups, interacts easily with peers. Patient denies SI/HI, denies A/V hallucinations. Patient states he is looking forward to spending time with his family upon discharge. Patient given support and encouragement. Patient remains on Q15 minute checks for safety. Will continue to monitor.

## 2012-02-25 NOTE — Progress Notes (Addendum)
[facility] MD Progress Note  02/25/2012 5:17 PM  Diagnosis:  Axis I: Substance Abuse and Substance Induced Mood Disorder  ADL's:  Intact  Sleep: Good  Appetite:  Good  Suicidal Ideation:  Pt denies any suicidal thoughts Homicidal Ideation:  Denies adamantly any homicidal thoughts.  Mental Status Examination/Evaluation: Objective:  Appearance: Casual  Eye Contact::  Good  Speech:  Clear and Coherent  Volume:  Normal  Mood:  Euthymic  Affect:  Congruent  Thought Process:  Coherent  Orientation:  Full  Thought Content:  WDL  Suicidal Thoughts:  No  Homicidal Thoughts:  No  Memory:  Immediate;   Good  Judgement:  Fair  Insight:  Fair  Psychomotor Activity:  Normal  Concentration:  Fair  Recall:  Fair  Akathisia:  No  AIMS (if indicated):     Assets:  Communication Skills Desire for Improvement  Sleep:  Number of Hours: 6.75    ROS: Neuro: no headaches, ataxia, weakness  GI: no N/V/D/cramps/constipation  MS: no weakness, muscle cramps, aches.  Vital Signs:Blood pressure 119/85, pulse 87, temperature 98 F (36.7 C), temperature source Oral, resp. rate 18, height 5\' 9"  (1.753 m), weight 93.441 kg (206 lb), SpO2 97.00%. Current Medications: Current Facility-Administered Medications  Medication Dose Route Frequency Provider Last Rate Last Dose  . acetaminophen (TYLENOL) tablet 650 mg  650 mg Oral Q6H PRN Viviann Spare, FNP   650 mg at 02/24/12 2000  . alum & mag hydroxide-simeth (MAALOX/MYLANTA) 200-200-20 MG/5ML suspension 30 mL  30 mL Oral Q4H PRN Viviann Spare, FNP      . amitriptyline (ELAVIL) tablet 25 mg  25 mg Oral QHS Mike Craze, MD      . diclofenac sodium (VOLTAREN) 1 % transdermal gel   Topical QID Mike Craze, MD       Followed by  . lidocaine (LIDODERM) 5 % 2 patch  2 patch Transdermal Q24H Mike Craze, MD      . DULoxetine (CYMBALTA) DR capsule 60 mg  60 mg Oral BID Wonda Cerise, MD   60 mg at 02/25/12 1653  . DULoxetine (CYMBALTA) DR capsule 60  mg  60 mg Oral Once Wonda Cerise, MD   60 mg at 02/24/12 2028  . fentaNYL (DURAGESIC - dosed mcg/hr) patch 25 mcg  25 mcg Transdermal Q72H Viviann Spare, FNP   25 mcg at 02/25/12 1011  . gabapentin (NEURONTIN) capsule 100 mg  100 mg Oral TID Mike Craze, MD   100 mg at 02/25/12 1213  . gabapentin (NEURONTIN) capsule 300 mg  300 mg Oral TID AC & HS Mike Craze, MD      . magnesium hydroxide (MILK OF MAGNESIA) suspension 30 mL  30 mL Oral Daily PRN Viviann Spare, FNP      . nicotine (NICODERM CQ - dosed in mg/24 hours) patch 21 mg  21 mg Transdermal Daily Mike Craze, MD   21 mg at 02/25/12 0647  . traZODone (DESYREL) tablet 100 mg  100 mg Oral QHS PRN Mike Craze, MD      . DISCONTD: DULoxetine (CYMBALTA) DR capsule 60 mg  60 mg Oral Daily Viviann Spare, FNP   60 mg at 02/24/12 4540  . DISCONTD: gabapentin (NEURONTIN) tablet 300 mg  300 mg Oral QID Mike Craze, MD      . DISCONTD: LORazepam (ATIVAN) tablet 1 mg  1 mg Oral Q4H PRN Viviann Spare, FNP   1 mg at  02/24/12 2003  . DISCONTD: traZODone (DESYREL) tablet 100 mg  100 mg Oral QHS Viviann Spare, FNP   100 mg at 02/24/12 2132    Lab Results:  Results for orders placed during the hospital encounter of 02/23/12 (from the past 48 hour(s))  CBC     Status: Abnormal   Collection Time   02/23/12  7:45 PM      Component Value Range Comment   WBC 11.2 (*) 4.0 - 10.5 (K/uL)    RBC 5.41  4.22 - 5.81 (MIL/uL)    Hemoglobin 16.2  13.0 - 17.0 (g/dL)    HCT 29.5  62.1 - 30.8 (%)    MCV 88.7  78.0 - 100.0 (fL)    MCH 29.9  26.0 - 34.0 (pg)    MCHC 33.8  30.0 - 36.0 (g/dL)    RDW 65.7  84.6 - 96.2 (%)    Platelets 243  150 - 400 (K/uL)   COMPREHENSIVE METABOLIC PANEL     Status: Normal   Collection Time   02/23/12  7:45 PM      Component Value Range Comment   Sodium 139  135 - 145 (mEq/L)    Potassium 4.4  3.5 - 5.1 (mEq/L)    Chloride 103  96 - 112 (mEq/L)    CO2 29  19 - 32 (mEq/L)    Glucose, Bld 89  70 - 99 (mg/dL)      BUN 9  6 - 23 (mg/dL)    Creatinine, Ser 9.52  0.50 - 1.35 (mg/dL)    Calcium 9.4  8.4 - 10.5 (mg/dL)    Total Protein 7.1  6.0 - 8.3 (g/dL)    Albumin 3.6  3.5 - 5.2 (g/dL)    AST 22  0 - 37 (U/L)    ALT 34  0 - 53 (U/L)    Alkaline Phosphatase 106  39 - 117 (U/L)    Total Bilirubin 0.4  0.3 - 1.2 (mg/dL)    GFR calc non Af Amer >90  >90 (mL/min)    GFR calc Af Amer >90  >90 (mL/min)   TSH     Status: Normal   Collection Time   02/23/12  7:45 PM      Component Value Range Comment   TSH 0.959  0.350 - 4.500 (uIU/mL)   URINALYSIS, ROUTINE W REFLEX MICROSCOPIC     Status: Abnormal   Collection Time   02/23/12  8:41 PM      Component Value Range Comment   Color, Urine YELLOW  YELLOW     APPearance CLOUDY (*) CLEAR     Specific Gravity, Urine 1.025  1.005 - 1.030     pH 5.5  5.0 - 8.0     Glucose, UA NEGATIVE  NEGATIVE (mg/dL)    Hgb urine dipstick NEGATIVE  NEGATIVE     Bilirubin Urine NEGATIVE  NEGATIVE     Ketones, ur NEGATIVE  NEGATIVE (mg/dL)    Protein, ur NEGATIVE  NEGATIVE (mg/dL)    Urobilinogen, UA 0.2  0.0 - 1.0 (mg/dL)    Nitrite NEGATIVE  NEGATIVE     Leukocytes, UA NEGATIVE  NEGATIVE  MICROSCOPIC NOT DONE ON URINES WITH NEGATIVE PROTEIN, BLOOD, LEUKOCYTES, NITRITE, OR GLUCOSE <1000 mg/dL.  URINE RAPID DRUG SCREEN (HOSP PERFORMED)     Status: Abnormal   Collection Time   02/23/12  8:41 PM      Component Value Range Comment   Opiates NONE DETECTED  NONE DETECTED  Cocaine NONE DETECTED  NONE DETECTED     Benzodiazepines POSITIVE (*) NONE DETECTED     Amphetamines NONE DETECTED  NONE DETECTED     Tetrahydrocannabinol POSITIVE (*) NONE DETECTED     Barbiturates NONE DETECTED  NONE DETECTED      Physical Findings: AIMS:  , ,  ,  ,    CIWA:    COWS:     Treatment Plan Summary: Daily contact with patient to assess and evaluate symptoms and progress in treatment Medication management Pt is able to manage his pain with no or mionimal opaites  Plan: Will offer  some non narcotic alternatives to the pt's current regimen to see if he can get off the Duragesic patches. He has had a pretty bad neck injury.  Will try Elavil with Cymbalta higher dose and Neurontin along with Voltaren and then Lidoderm patches.  Could possibly add anti inflammatory tomorrow if this doesn't help better.  For optimal pain management will also help him stop using nicotine as that seems to amplify the pain preceptions in some people.  Stephen Porter 02/25/2012, 5:17 PM

## 2012-02-25 NOTE — BHH Counselor (Addendum)
Group Therapy Note  Date:  02/25/2012 Time:  11:00  Group Topic/Focus:  Overcoming Obstacles to Wellness  Participation Level:  Minimal  Participation Quality:  Appropriate  Affect:  Appropriate  Cognitive:  Appropriate  Insight:  Limited  Engagement in Group:  Limited  Additional Comments:  Stephen Porter did not speak much during session but was listening while others spoke and was present for the entire group.   [name]. 02/25/2012, 12:01 PM     BHH Group Notes: (Counselor/Nursing/MHT/Case Management/Adjunct) 02/25/2012   @1 :15pm Mindfulness Meditation for Self-Acceptance  Type of Therapy:  Group Therapy  Participation Level:  Active  Participation Quality:  Attentive   Affect:  Appropriate  Cognitive:  Appropriate  Insight:  None  Engagement in Group: Limited  Engagement in Therapy:  Limited  Modes of Intervention:  Support and Exploration  Summary of Progress/Problems: Stephen Porter participated in mindfulness meditation but did not process his experience afterward.  [name] 02/25/2012 2:12 PM

## 2012-02-25 NOTE — Progress Notes (Signed)
Pt attended discharge planning group and actively participated.  Pt presents with calm mood and affect.  Pt was open with sharing reason for entering the hospital.  Pt states that he's been depressed for awhile but has increased within the last month.  Pt states that he was already on medication.  Pt states that from being here over the weekend he feels better.  Pt states that he realizes that he needs to reach out to his daughters and wife for support when he is feeling down.  Pt states that he is on disability and lives in [place] with his wife.  Pt ranks depression at a 2 and denies anxiety and SI.  Pt will follow up at [facility] for medication management and therapy.  No further needs voiced by pt at this time.  Safety planning and suicide prevention discussed.     Stephen Porter, LCSWA 02/25/2012  10:38 AM

## 2012-02-26 MED ORDER — TRAZODONE HCL 100 MG PO TABS: 100.0000 mg | ORAL_TABLET | Freq: Every evening | ORAL | Status: DC | PRN

## 2012-02-26 MED ORDER — LIDOCAINE 5 % EX PTCH: 2.0000 | MEDICATED_PATCH | CUTANEOUS | Status: AC

## 2012-02-26 MED ORDER — AMITRIPTYLINE HCL 25 MG PO TABS: 25.0000 mg | ORAL_TABLET | Freq: Every day | ORAL | Status: DC

## 2012-02-26 MED ORDER — GABAPENTIN 300 MG PO CAPS: 300.0000 mg | ORAL_CAPSULE | Freq: Three times a day (TID) | ORAL | Status: DC

## 2012-02-26 MED ORDER — DULOXETINE HCL 60 MG PO CPEP: 60.0000 mg | ORAL_CAPSULE | Freq: Two times a day (BID) | ORAL | Status: DC

## 2012-02-26 NOTE — Tx Team (Signed)
Interdisciplinary Treatment Plan Update (Adult)  Date:  02/26/2012  Time Reviewed:  10:32 AM   Progress in Treatment: Attending groups: Yes Participating in groups:  Yes Taking medication as prescribed: Yes Tolerating medication:  Yes Family/Significant other contact made:   Patient understands diagnosis:  Yes Discussing patient identified problems/goals with staff:  Yes Medical problems stabilized or resolved:  Yes Denies suicidal/homicidal ideation: Yes Issues/concerns per patient self-inventory:  None identified Other: N/A  New problem(s) identified: None Identified  Reason for Continuation of Hospitalization: Stable to d/c  Interventions implemented related to continuation of hospitalization: Stable to d/c  Additional comments: N/A  Estimated length of stay: D/C today  Discharge Plan: Pt will follow up with [facility] for medication management and therapy.   New goal(s): N/A  Review of initial/current patient goals per problem list:    1.  Goal(s): Reduce depressive symptoms  Met:  Yes  Target date: by discharge  As evidenced by: Reducing depression from a 10 to a 3 as reported by pt. Pt ranks at a 1 today.   2.  Goal (s): Reduce/Eliminate suicidal ideation  Met:  Yes  Target date: by discharge  As evidenced by: pt reporting no SI.    3.  Goal(s): Reduce anxiety symptoms  Met:  Yes  Target date: by discharge  As evidenced by: Reduce anxiety from a 10 to a 3 as reported by pt. Pt ranks at a 1 today.    Attendees: Patient:  Stephen Porter 02/26/2012 10:33 AM   Family:     Physician:  Orson Aloe, MD  02/26/2012  10:32 AM   Nursing:   Barrie Folk, RN 02/26/2012 10:33 AM   Case Manager:  Reyes Ivan, LCSWA 02/26/2012  10:32 AM   Counselor:  Angus Palms, LCSW 02/26/2012  10:32 AM   Other:  Juline Patch, LCSW 02/26/2012  10:32 AM   Other:  Manuela Schwartz, RN 02/26/2012  10:32 AM   Other:     Other:      Scribe for Treatment  Team:   Carmina Miller, 02/26/2012 , 10:32 AM

## 2012-02-26 NOTE — Progress Notes (Signed)
Pt laying in bed resting with eyes closed. Respirations even and unlabored. No distress noted.  

## 2012-02-26 NOTE — Progress Notes (Addendum)
Patient reports sleeping well, appetite good, energy level normal and ability to pay attention improving. Rates depression at 1/10 and hopelessness 1/10. Denies SI/HI and psychosis. Forward thinking, looking forward to his grandchildren's birth. Encouraged to review coping skills prior to discharge. Suicide risk information reviewed with patient and understanding verbalized. Patient looks much improved.

## 2012-02-26 NOTE — Progress Notes (Signed)
[facility] Group Notes: (Counselor/Nursing/MHT/Case Management/Adjunct) 02/26/2012   @ 11:00 Feelings About Diagnosis  Type of Therapy:  Group Therapy  Participation Level:  Active  Participation Quality:  Appropriate, Sharing, Supportive   Affect:  Appropriate  Cognitive:  Appropriate  Insight:  Good  Engagement in Group: Good  Engagement in Therapy:  Limited  Modes of Intervention:  Support and Exploration  Summary of Progress/Problems: Stephen Porter was attentive and engaged, though he did not share personally in the discussion of thoughts that lead to developing beliefs. However, in the second half of group he related well to other group members who processed feelings about being given extra responsibility as caregivers. Stephen Porter explored how he is the only one of the siblings to take care of his parents, and how he believes this is what an adult child should do. He talked about being tempted to blame his siblings and believe that they are bad people for not taking care of their parents, but he recognizes that these are his beliefs and may not be his siblings'. Stephen Porter processed his feelings about his parents and stated that seeing his father's failing health impacts his own depression. He went on to say that he cannot let his siblings' decisions continue to push him into depression, and must accept that this is the path they have chosen.    [name] 02/26/2012  1:24 PM

## 2012-02-26 NOTE — [facility] Suicide Risk Assessment (Addendum)
Suicide Risk Assessment  Discharge Assessment     Demographic factors:       Current Mental Status Per Nursing Assessment::   On Admission:    At Discharge:     Current Mental Status Per Physician:  Loss Factors:    Historical Factors:    Risk Reduction Factors:   Sense of responsibility to family;Living with another person, especially a relative;Positive social support;Positive therapeutic relationship  Continued Clinical Symptoms:  Alcohol/Substance Abuse/Dependencies Chronic Pain Previous Psychiatric Diagnoses and Treatments  Discharge Diagnoses:   AXIS I:  Substance Induced Mood Disorder and Opiate use AXIS II:  Deferred AXIS III:   Past Medical History  Diagnosis Date  . Chest pain   . Tobacco abuse   . Hyperlipidemia   . Anxiety and depression   . Insomnia   . DDD (degenerative disc disease)   . Jejunal ulcer   . Depression    AXIS IV:  other psychosocial or environmental problems AXIS V:  61-70 mild symptoms  Cognitive Features That Contribute To Risk:  Thought constriction (tunnel vision)    Suicide Risk:  Minimal: No identifiable suicidal ideation.  Patients presenting with no risk factors but with morbid ruminations; may be classified as minimal risk based on the severity of the depressive symptoms  Diagnosis:  Axis I: Opiate use, Substance Induced Mood Disorder, Nicotine Dependence  ADL's:  Intact  Sleep: Good  Appetite:  Good  Suicidal Ideation:  Pt denies any suicidal thoughts Homicidal Ideation:  Denies adamantly any homicidal thoughts.  Mental Status Examination/Evaluation: Objective:  Appearance: Casual  Eye Contact::  Good  Speech:  Clear and Coherent  Volume:  Normal  Mood:  Euthymic  Affect:  Congruent  Thought Process:  Coherent  Orientation:  Full  Thought Content:  WDL  Suicidal Thoughts:  No  Homicidal Thoughts:  No  Memory:  Immediate;   Good  Judgement:  Fair  Insight:  Fair  Psychomotor Activity:  Normal    Concentration:  Fair  Recall:  Fair  Akathisia:  No  AIMS (if indicated):     Assets:  Communication Skills Desire for Improvement  Sleep:  Number of Hours: 6.75    ROS: Neuro: denies headaches, ataxia, weakness  GI: denies N/V/D/cramps/constipation  MS: denies weakness, muscle cramps, aches.  Vital Signs:Blood pressure 148/90, pulse 94, temperature 98 F (36.7 C), temperature source Oral, resp. rate 17, height 5\' 9"  (1.753 m), weight 93.441 kg (206 lb), SpO2 97.00%. Current Medications: Current Facility-Administered Medications  Medication Dose Route Frequency Provider Last Rate Last Dose  . acetaminophen (TYLENOL) tablet 650 mg  650 mg Oral Q6H PRN Viviann Spare, FNP   650 mg at 02/24/12 2000  . alum & mag hydroxide-simeth (MAALOX/MYLANTA) 200-200-20 MG/5ML suspension 30 mL  30 mL Oral Q4H PRN Viviann Spare, FNP      . amitriptyline (ELAVIL) tablet 25 mg  25 mg Oral QHS Mike Craze, MD   25 mg at 02/25/12 2144  . diclofenac sodium (VOLTAREN) 1 % transdermal gel   Topical QID Mike Craze, MD   1 application at 02/25/12 2144   Followed by  . lidocaine (LIDODERM) 5 % 2 patch  2 patch Transdermal Q24H Mike Craze, MD   2 patch at 02/26/12 [phone]  . DULoxetine (CYMBALTA) DR capsule 60 mg  60 mg Oral BID Wonda Cerise, MD   60 mg at 02/26/12 0803  . fentaNYL (DURAGESIC - dosed mcg/hr) patch 25 mcg  25 mcg Transdermal Q72H  Viviann Spare, FNP   25 mcg at 02/25/12 1011  . gabapentin (NEURONTIN) capsule 100 mg  100 mg Oral TID Mike Craze, MD   100 mg at 02/25/12 1819  . gabapentin (NEURONTIN) capsule 300 mg  300 mg Oral TID AC & HS Mike Craze, MD   300 mg at 02/26/12 0803  . magnesium hydroxide (MILK OF MAGNESIA) suspension 30 mL  30 mL Oral Daily PRN Viviann Spare, FNP      . nicotine (NICODERM CQ - dosed in mg/24 hours) patch 21 mg  21 mg Transdermal Daily Mike Craze, MD   21 mg at 02/26/12 6295  . traZODone (DESYREL) tablet 100 mg  100 mg Oral QHS PRN Mike Craze, MD   100 mg at 02/25/12 2144  . DISCONTD: gabapentin (NEURONTIN) tablet 300 mg  300 mg Oral QID Mike Craze, MD        Lab Results:  Results for orders placed during the hospital encounter of 02/23/12 (from the past 72 hour(s))  CBC     Status: Abnormal   Collection Time   02/23/12  7:45 PM      Component Value Range Comment   WBC 11.2 (*) 4.0 - 10.5 (K/uL)    RBC 5.41  4.22 - 5.81 (MIL/uL)    Hemoglobin 16.2  13.0 - 17.0 (g/dL)    HCT 28.4  13.2 - 44.0 (%)    MCV 88.7  78.0 - 100.0 (fL)    MCH 29.9  26.0 - 34.0 (pg)    MCHC 33.8  30.0 - 36.0 (g/dL)    RDW 10.2  72.5 - 36.6 (%)    Platelets 243  150 - 400 (K/uL)   COMPREHENSIVE METABOLIC PANEL     Status: Normal   Collection Time   02/23/12  7:45 PM      Component Value Range Comment   Sodium 139  135 - 145 (mEq/L)    Potassium 4.4  3.5 - 5.1 (mEq/L)    Chloride 103  96 - 112 (mEq/L)    CO2 29  19 - 32 (mEq/L)    Glucose, Bld 89  70 - 99 (mg/dL)    BUN 9  6 - 23 (mg/dL)    Creatinine, Ser 4.40  0.50 - 1.35 (mg/dL)    Calcium 9.4  8.4 - 10.5 (mg/dL)    Total Protein 7.1  6.0 - 8.3 (g/dL)    Albumin 3.6  3.5 - 5.2 (g/dL)    AST 22  0 - 37 (U/L)    ALT 34  0 - 53 (U/L)    Alkaline Phosphatase 106  39 - 117 (U/L)    Total Bilirubin 0.4  0.3 - 1.2 (mg/dL)    GFR calc non Af Amer >90  >90 (mL/min)    GFR calc Af Amer >90  >90 (mL/min)   TSH     Status: Normal   Collection Time   02/23/12  7:45 PM      Component Value Range Comment   TSH 0.959  0.350 - 4.500 (uIU/mL)   URINALYSIS, ROUTINE W REFLEX MICROSCOPIC     Status: Abnormal   Collection Time   02/23/12  8:41 PM      Component Value Range Comment   Color, Urine YELLOW  YELLOW     APPearance CLOUDY (*) CLEAR     Specific Gravity, Urine 1.025  1.005 - 1.030     pH 5.5  5.0 - 8.0  Glucose, UA NEGATIVE  NEGATIVE (mg/dL)    Hgb urine dipstick NEGATIVE  NEGATIVE     Bilirubin Urine NEGATIVE  NEGATIVE     Ketones, ur NEGATIVE  NEGATIVE (mg/dL)    Protein, ur  NEGATIVE  NEGATIVE (mg/dL)    Urobilinogen, UA 0.2  0.0 - 1.0 (mg/dL)    Nitrite NEGATIVE  NEGATIVE     Leukocytes, UA NEGATIVE  NEGATIVE  MICROSCOPIC NOT DONE ON URINES WITH NEGATIVE PROTEIN, BLOOD, LEUKOCYTES, NITRITE, OR GLUCOSE <1000 mg/dL.  URINE RAPID DRUG SCREEN (HOSP PERFORMED)     Status: Abnormal   Collection Time   02/23/12  8:41 PM      Component Value Range Comment   Opiates NONE DETECTED  NONE DETECTED     Cocaine NONE DETECTED  NONE DETECTED     Benzodiazepines POSITIVE (*) NONE DETECTED     Amphetamines NONE DETECTED  NONE DETECTED     Tetrahydrocannabinol POSITIVE (*) NONE DETECTED     Barbiturates NONE DETECTED  NONE DETECTED      RISK REDUCTION FACTORS: What pt has learned from hospital stay is that he needs to work on having more positive thoughts and to stay busy and to step back and take a positive angle to the problem.  Risk of self harm is elevated by this his first suicide attempt, but he has realized that he has 2 grand daughter's due on the same date.  Risk of harm to others is minimal in that he has not been involved in fights or had any legal charges filed on him.  Pt seen in treatment team where he divulged the above information. The treatment team concluded that he was ready for discharge and had met his goals for an inpatient setting.  PLAN: Discharge home Continue Medication List  As of 02/26/2012 12:44 PM   STOP taking these medications         ALPRAZolam 1 MG tablet      fentaNYL 25 MCG/HR      oxyCODONE 5 MG immediate release tablet         TAKE these medications      Indication    amitriptyline 25 MG tablet   Commonly known as: ELAVIL   Take 1 tablet (25 mg total) by mouth at bedtime. For pain management, depression, and insomnia.       DULoxetine 60 MG capsule   Commonly known as: CYMBALTA   Take 1 capsule (60 mg total) by mouth 2 (two) times daily. For depression and pain management       gabapentin 300 MG capsule   Commonly known  as: NEURONTIN   Take 1 capsule (300 mg total) by mouth 4 (four) times daily -  before meals and at bedtime. For pain management       lidocaine 5 %   Commonly known as: LIDODERM   Place 2 patches onto the skin daily. Remove & Discard patch within 12 hours or as directed by MD for pain management       traZODone 100 MG tablet   Commonly known as: DESYREL   Take 1 tablet (100 mg total) by mouth at bedtime as needed (insomnia).            Follow-up recommendations:  Activities: Resume typical activities Diet: Resume typical diet Other: Follow up with outpatient provider and report any side effects to out patient prescriber.  Plan: Pt feels that the medication changes have been most successful and wishes to be discharged to  let him finish the task of getting off the opiates.  Will D/C today.    Neasia Fleeman 02/26/2012, 12:43 PM

## 2012-02-26 NOTE — Progress Notes (Signed)
[facility] Case Management Discharge Plan:  Will you be returning to the same living situation after discharge: Yes,  return home At discharge, do you have transportation home?:Yes,  access to transportation Do you have the ability to pay for your medications:Yes,  access to meds  Release of information consent forms completed and in the chart;  Patient's signature needed at discharge.  Patient to Follow up at:  Follow-up Information    Follow up with [facility] - Outpatient on 03/13/2012. (Appointment scheduled at 11:30 am with Dr. Lolly Mustache (medication management))    Contact information:   [address] [place], [place] [address] [phone]      Follow up with [facility] - Outpatient on 03/17/2012. (Appointment scheduled at 10:30 am with Florencia Reasons (therapy))    Contact information:   [address] [place], [place] [address] [phone]         Patient denies SI/HI:   Yes,  denies SI/HI today    Safety Planning and Suicide Prevention discussed:  Yes,  discussed with pt  Barrier to discharge identified:No.  Summary and Recommendations: Pt attended discharge planning group and actively participated.  Pt presents with calm mood and affect.  Pt ranks depression and anxiety at a 1 today.  Pt denies SI.  Pt reports feeling stable to d/c today.  No recommendations from SW.  No further needs voiced by pt.  Pt stable to discharge.     Carmina Miller 02/26/2012, 10:33 AM

## 2012-02-26 NOTE — Progress Notes (Addendum)
[facility] MD Progress Note  02/26/2012 9:59 AM  Diagnosis:  Axis I: Opiate use, Substance Induced Mood Disorder, Nicotine Dependence  ADL's:  Intact  Sleep: Good  Appetite:  Good  Suicidal Ideation:  Pt denies any suicidal thoughts Homicidal Ideation:  Denies adamantly any homicidal thoughts.  Mental Status Examination/Evaluation: Objective:  Appearance: Casual  Eye Contact::  Good  Speech:  Clear and Coherent  Volume:  Normal  Mood:  Euthymic  Affect:  Congruent  Thought Process:  Coherent  Orientation:  Full  Thought Content:  WDL  Suicidal Thoughts:  No  Homicidal Thoughts:  No  Memory:  Immediate;   Good  Judgement:  Fair  Insight:  Fair  Psychomotor Activity:  Normal  Concentration:  Fair  Recall:  Fair  Akathisia:  No  AIMS (if indicated):     Assets:  Communication Skills Desire for Improvement  Sleep:  Number of Hours: 6.75    ROS: Neuro: denies headaches, ataxia, weakness  GI: denies N/V/D/cramps/constipation  MS: denies weakness, muscle cramps, aches.  Vital Signs:Blood pressure 148/90, pulse 94, temperature 98 F (36.7 C), temperature source Oral, resp. rate 17, height 5\' 9"  (1.753 m), weight 93.441 kg (206 lb), SpO2 97.00%. Current Medications: Current Facility-Administered Medications  Medication Dose Route Frequency Provider Last Rate Last Dose  . acetaminophen (TYLENOL) tablet 650 mg  650 mg Oral Q6H PRN Viviann Spare, FNP   650 mg at 02/24/12 2000  . alum & mag hydroxide-simeth (MAALOX/MYLANTA) 200-200-20 MG/5ML suspension 30 mL  30 mL Oral Q4H PRN Viviann Spare, FNP      . amitriptyline (ELAVIL) tablet 25 mg  25 mg Oral QHS Mike Craze, MD   25 mg at 02/25/12 2144  . diclofenac sodium (VOLTAREN) 1 % transdermal gel   Topical QID Mike Craze, MD   1 application at 02/25/12 2144   Followed by  . lidocaine (LIDODERM) 5 % 2 patch  2 patch Transdermal Q24H Mike Craze, MD   2 patch at 02/26/12 [phone]  . DULoxetine (CYMBALTA) DR capsule 60 mg   60 mg Oral BID Wonda Cerise, MD   60 mg at 02/26/12 0803  . fentaNYL (DURAGESIC - dosed mcg/hr) patch 25 mcg  25 mcg Transdermal Q72H Viviann Spare, FNP   25 mcg at 02/25/12 1011  . gabapentin (NEURONTIN) capsule 100 mg  100 mg Oral TID Mike Craze, MD   100 mg at 02/25/12 1819  . gabapentin (NEURONTIN) capsule 300 mg  300 mg Oral TID AC & HS Mike Craze, MD   300 mg at 02/26/12 0803  . magnesium hydroxide (MILK OF MAGNESIA) suspension 30 mL  30 mL Oral Daily PRN Viviann Spare, FNP      . nicotine (NICODERM CQ - dosed in mg/24 hours) patch 21 mg  21 mg Transdermal Daily Mike Craze, MD   21 mg at 02/26/12 8295  . traZODone (DESYREL) tablet 100 mg  100 mg Oral QHS PRN Mike Craze, MD   100 mg at 02/25/12 2144  . DISCONTD: gabapentin (NEURONTIN) tablet 300 mg  300 mg Oral QID Mike Craze, MD        Lab Results:  No results found for this or any previous visit (from the past 48 hour(s)).  Physical Findings: AIMS:  , ,  ,  ,    CIWA:    COWS:     Treatment Plan Summary: Daily contact with patient to assess and evaluate symptoms  and progress in treatment Medication management Pt is able to manage his pain with no or mionimal opaites  Plan: Pt feels that the medication changes have been most successful and wishes to be discharged to let him finish the task of getting off the opiates.  Will D/C today.  Stephen Porter 02/26/2012, 9:59 AM

## 2012-02-26 NOTE — Progress Notes (Signed)
Discharge Note: Patient eager for discharge. Wife picked him up. Patient denies SI/HI. AVS reviewed. No further questions. Happy, forward thinking. Joice Lofts RN MS EdS 02/26/2012  3:04 PM

## 2012-02-27 NOTE — Progress Notes (Signed)
[facility] Adult Inpatient Family/Significant Other Suicide Prevention Education  Suicide Prevention Education:  Education Completed; Stephen Porter, wife, has been identified by the patient as the family member/significant other with whom the patient will be residing, and identified as the person(s) who will aid the patient in the event of a mental health crisis (suicidal ideations/suicide attempt).  With written consent from the patient, the family member/significant other has been provided the following suicide prevention education, prior to the and/or following the discharge of the patient.  The suicide prevention education provided includes the following:  Suicide risk factors  Suicide prevention and interventions  National Suicide Hotline telephone number  [facility] assessment telephone number  [facility] Emergency Assistance 911  [facility] and/or Residential Mobile Crisis Unit telephone number  Request made of family/significant other to:  Remove weapons (e.g., guns, rifles, knives), all items previously/currently identified as safety concern.    Remove drugs/medications (over-the-counter, prescriptions, illicit drugs), all items previously/currently identified as a safety concern.  Counselor met with Stephen Porter when she presented for visitation with [name] on this date. Stephen Porter was present for the conversation. Counselor provided Stephen Porter with suicide prevention pamphlet and reviewed with her and Stephen Porter the information contained therein. 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 Counselor and Stephen Porter discussed crisis numbers in the brochure and counselor explained that at discharge Stephen Porter would receive in his discharge packet a card for Mobile Crisis. Counselor also  explained that he would have follow-up appointments with a therapist and psychiatrist before he is discharged. Stephen Porter verified that Stephen Porter has no access to weapons, and had no further questions. Counselor encouraged Stephen Porter to contact the hospital and/or this counselor if she had concerns or questions once she got home.   [name] 02/25/2012  12:37pm

## 2012-02-27 NOTE — Progress Notes (Signed)
Patient Discharge Instructions:  After Visit Summary (AVS):   Access to EMR:  02/27/2012 Psychiatric Admission Assessment Note:   Access to EMR:  02/27/2012 Suicide Risk Assessment - Discharge Assessment:   Access to EMR:  02/27/2012 Next Level Care Provider Has Access to the EMR, 02/27/2012  Records provided to [facility] Lolly Mustache, Florencia Reasons via [facility]/Epic access.  Wandra Scot, 02/27/2012, 5:51 PM

## 2012-03-10 NOTE — Discharge Summary (Signed)
Physician Discharge Summary Note  Patient:  Stephen Porter is an 56 y.o., male MRN:  [id number] DOB:  [birth year] Patient phone:  [phone] (home)  Patient address:   [address] [place] [address],   Date of Admission:  02/23/2012 Date of Discharge: 02/26/2012  Reason for Admission: Crying uncontrollably Friday and Saturday and voicing suicidal ideation. Wife an Charity fundraiser at [employer] quite concerned as he is usually quite stoic. He can usually talk himself down but lately "just wants to leave here". Became unemployed 2003 after a work injury. Had neck surgery 2002 & 2003. Back surgery 2005. The back surgery left him addicted to pain meds and required a detox at [facility] in 2009.  [facility] prescribes Cymbalta  Pain doc prescribes other meds. Dr. Raeanne [facility]  His father whom he is very close too is very slowly succumbing to dementia and his mother is starting to have sundowning. Patient hates that his brain wants to do things but his body can't.   Discharge Diagnoses: Principal Problem:  *Psychoactive substance-induced organic mood disorder Active Problems:  Other depression due to general medical condition   Axis Diagnosis:   AXIS I:  Substance Induced Mood Disorder and Opiate Dependence and Nicotine Dependence AXIS II:  Deferred AXIS III:   Past Medical History  Diagnosis Date  . Chest pain   . Tobacco abuse   . Hyperlipidemia   . Anxiety and depression   . Insomnia   . DDD (degenerative disc disease)   . Jejunal ulcer   . Depression    AXIS IV:  other psychosocial or environmental problems AXIS V:  61-70 mild symptoms  Level of Care:  OP  Hospital Course: Pt was admitted for stabilization.  While a patient in this hospital, Stephen Porter received medication management for his opiate addiction and pain management. They were ordered and received Cymbalta 60 mg BID, Elavil 25 mg at HS, trial on Voltaren gel, then Lidoderm patch, and Neurontin 300 mg TID for  pain. They were also enrolled in group counseling sessions and activities in which they participated actively.   Patient attended treatment team meeting this am and met with treatment team members. Pt symptoms, treatment plan and response to treatment discussed. Stephen Porter endorsed that their symptoms have improved. Pt also stated that they are stable for discharge. In other to maintain his pain management and his sobriety, they will continue psychiatric care on outpatient basis. They will follow-up at [facility] in [place], [place]  Consults:  None  Significant Diagnostic Studies:  labs: WBC elevated at 11.2, UDS positive for Benzos and THC.  Discharge Vitals:   Blood pressure 148/90, pulse 94, temperature 98 F (36.7 C), temperature source Oral, resp. rate 17, height 5\' 9"  (1.753 m), weight 93.441 kg (206 lb), SpO2 97.00%.  Mental Status Exam: See Mental Status Examination and Suicide Risk Assessment completed by Attending Physician prior to discharge.  Discharge destination:  Home  Is patient on multiple antipsychotic therapies at discharge:  No   Has Patient had three or more failed trials of antipsychotic monotherapy by history:  N/A Recommended Plan for Multiple Antipsychotic Therapies: N/A  Medication List  As of 03/10/2012  9:45 PM   STOP taking these medications         ALPRAZolam 1 MG tablet      fentaNYL 25 MCG/HR      oxyCODONE 5 MG immediate release tablet         TAKE these medications  Indication    amitriptyline 25 MG tablet   Commonly known as: ELAVIL   Take 1 tablet (25 mg total) by mouth at bedtime. For pain management, depression, and insomnia.       DULoxetine 60 MG capsule   Commonly known as: CYMBALTA   Take 1 capsule (60 mg total) by mouth 2 (two) times daily. For depression and pain management       gabapentin 300 MG capsule   Commonly known as: NEURONTIN   Take 1 capsule (300 mg total) by mouth 4 (four) times daily -  before meals and  at bedtime. For pain management       lidocaine 5 %   Commonly known as: LIDODERM   Place 2 patches onto the skin daily. Remove & Discard patch within 12 hours or as directed by MD for pain management       traZODone 100 MG tablet   Commonly known as: DESYREL   Take 1 tablet (100 mg total) by mouth at bedtime as needed (insomnia).            Follow-up Information    Follow up with [facility] - Outpatient on 03/13/2012. (Appointment scheduled at 11:30 am with Dr. Lolly Mustache (medication management))    Contact information:   [address], [address] [place], [place] [address] [phone]      Follow up with [facility] - Outpatient on 03/17/2012. (Appointment scheduled at 10:30 am with Florencia Reasons (therapy))    Contact information:   [address], [address] [place], [place] [address] [phone]        Follow-up recommendations:  Activities: Resume typical activities Diet: Resume typical diet Other: Follow up with outpatient provider and report any side effects to out patient prescriber.  SignedDan Humphreys, Zakari Couchman 03/10/2012, 9:45 PM

## 2012-03-13 ENCOUNTER — Ambulatory Visit (INDEPENDENT_AMBULATORY_CARE_PROVIDER_SITE_OTHER): Payer: 59 | Admitting: Psychiatry

## 2012-03-13 ENCOUNTER — Encounter (HOSPITAL_COMMUNITY): Payer: Self-pay | Admitting: Psychiatry

## 2012-03-13 VITALS — Wt 212.8 lb

## 2012-03-13 DIAGNOSIS — F1994 Other psychoactive substance use, unspecified with psychoactive substance-induced mood disorder: Secondary | ICD-10-CM

## 2012-03-13 DIAGNOSIS — F329 Major depressive disorder, single episode, unspecified: Secondary | ICD-10-CM

## 2012-03-13 DIAGNOSIS — F1120 Opioid dependence, uncomplicated: Secondary | ICD-10-CM

## 2012-03-13 MED ORDER — AMITRIPTYLINE HCL 50 MG PO TABS: 50.0000 mg | ORAL_TABLET | Freq: Every day | ORAL | Status: DC

## 2012-03-13 MED ORDER — GABAPENTIN 300 MG PO CAPS: 300.0000 mg | ORAL_CAPSULE | Freq: Three times a day (TID) | ORAL | Status: DC
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
## 2012-03-17 ENCOUNTER — Ambulatory Visit (INDEPENDENT_AMBULATORY_CARE_PROVIDER_SITE_OTHER): Payer: 59 | Admitting: Psychiatry

## 2012-03-17 DIAGNOSIS — F329 Major depressive disorder, single episode, unspecified: Secondary | ICD-10-CM

## 2012-03-19 NOTE — Patient Instructions (Signed)
Discussed orally 
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
## 2012-03-28 ENCOUNTER — Ambulatory Visit (INDEPENDENT_AMBULATORY_CARE_PROVIDER_SITE_OTHER): Payer: Medicare Other | Admitting: Psychiatry

## 2012-03-28 DIAGNOSIS — F329 Major depressive disorder, single episode, unspecified: Secondary | ICD-10-CM

## 2012-03-28 NOTE — Patient Instructions (Signed)
Discussed orally 
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
## 2012-04-08 ENCOUNTER — Ambulatory Visit (INDEPENDENT_AMBULATORY_CARE_PROVIDER_SITE_OTHER): Payer: Medicare Other | Admitting: Psychiatry

## 2012-04-08 ENCOUNTER — Encounter (HOSPITAL_COMMUNITY): Payer: Self-pay | Admitting: Psychiatry

## 2012-04-08 VITALS — BP 130/90 | HR 88 | Wt 219.0 lb

## 2012-04-08 DIAGNOSIS — F1994 Other psychoactive substance use, unspecified with psychoactive substance-induced mood disorder: Secondary | ICD-10-CM

## 2012-04-08 DIAGNOSIS — F329 Major depressive disorder, single episode, unspecified: Secondary | ICD-10-CM

## 2012-04-08 DIAGNOSIS — F1120 Opioid dependence, uncomplicated: Secondary | ICD-10-CM

## 2012-04-08 MED ORDER — GABAPENTIN 300 MG PO CAPS: 300.0000 mg | ORAL_CAPSULE | Freq: Three times a day (TID) | ORAL | Status: DC

## 2012-04-08 MED ORDER — AMITRIPTYLINE HCL 75 MG PO TABS: 75.0000 mg | ORAL_TABLET | Freq: Every day | ORAL | Status: DC
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
## 2012-04-25 ENCOUNTER — Ambulatory Visit (INDEPENDENT_AMBULATORY_CARE_PROVIDER_SITE_OTHER): Payer: Medicare Other | Admitting: Psychiatry

## 2012-04-25 DIAGNOSIS — F329 Major depressive disorder, single episode, unspecified: Secondary | ICD-10-CM

## 2012-04-25 NOTE — Patient Instructions (Signed)
Discussed orally 
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
## 2012-05-06 ENCOUNTER — Encounter (HOSPITAL_COMMUNITY): Payer: Self-pay | Admitting: Psychiatry

## 2012-05-06 ENCOUNTER — Ambulatory Visit (INDEPENDENT_AMBULATORY_CARE_PROVIDER_SITE_OTHER): Payer: Medicare Other | Admitting: Psychiatry

## 2012-05-06 VITALS — BP 110/90 | HR 101 | Wt 226.0 lb

## 2012-05-06 DIAGNOSIS — F1120 Opioid dependence, uncomplicated: Secondary | ICD-10-CM

## 2012-05-06 DIAGNOSIS — F1994 Other psychoactive substance use, unspecified with psychoactive substance-induced mood disorder: Secondary | ICD-10-CM

## 2012-05-06 DIAGNOSIS — F329 Major depressive disorder, single episode, unspecified: Secondary | ICD-10-CM

## 2012-05-06 MED ORDER — GABAPENTIN 300 MG PO CAPS: 300.0000 mg | ORAL_CAPSULE | Freq: Three times a day (TID) | ORAL | Status: DC

## 2012-05-06 MED ORDER — AMITRIPTYLINE HCL 75 MG PO TABS: 75.0000 mg | ORAL_TABLET | Freq: Every day | ORAL | Status: DC
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
## 2012-05-20 ENCOUNTER — Ambulatory Visit (INDEPENDENT_AMBULATORY_CARE_PROVIDER_SITE_OTHER): Payer: Medicare Other | Admitting: Psychiatry

## 2012-05-20 DIAGNOSIS — F329 Major depressive disorder, single episode, unspecified: Secondary | ICD-10-CM
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
## 2012-05-21 NOTE — Patient Instructions (Signed)
Discussed orally 

## 2012-06-12 ENCOUNTER — Ambulatory Visit (INDEPENDENT_AMBULATORY_CARE_PROVIDER_SITE_OTHER): Payer: Medicare Other | Admitting: Psychiatry

## 2012-06-12 ENCOUNTER — Encounter (HOSPITAL_COMMUNITY): Payer: Self-pay | Admitting: Psychiatry

## 2012-06-12 VITALS — Wt 228.0 lb

## 2012-06-12 DIAGNOSIS — F1120 Opioid dependence, uncomplicated: Secondary | ICD-10-CM

## 2012-06-12 DIAGNOSIS — F319 Bipolar disorder, unspecified: Secondary | ICD-10-CM

## 2012-06-12 DIAGNOSIS — F39 Unspecified mood [affective] disorder: Secondary | ICD-10-CM

## 2012-06-12 DIAGNOSIS — F329 Major depressive disorder, single episode, unspecified: Secondary | ICD-10-CM

## 2012-06-12 MED ORDER — LAMOTRIGINE 25 MG PO TABS: ORAL_TABLET | ORAL | Status: DC

## 2012-06-12 NOTE — Progress Notes (Signed)
Chief complaint My father died 3 weeks ago after a heart attack.  I'm feeling very overwhelmed and having anger issues.  I feel my medicines are not working.  I have missed appointment with therapist.  I'm not taking pain medication because my primary care physician refuse to give pain patches.    History of present illness Patient is 56 year old married Caucasian male who came for his appointment.  Patient endorse increased anxiety depression irritability and mood swing.  He's not sleeping very well.  He is not taking Neurontin which was stopped on his last visit with the recommendation to see his pain Dr. her primary care physician for continuity of pain management.  Patient has message for his primary care physician Dr. Juanetta Gosling however he has not received any call backs.  He's not taking pain patches.  He admitted having pain and is still trying to get appointment with primary care physician.  He is very sad and upset as his father died on 09-13-2024due to heart attack.   He also admitted having irritability mood swings agitation and anger.  He admitted social isolation but sometimes feeling of hopelessness and helplessness.  He endorse that his wife believe that he has bipolar disorder.  He admitted some time going into rage and anger.  He is taking amitriptyline and Cymbalta as prescribed.  He denies any active or passive suicidal thoughts or homicidal thoughts but endorse sometime frustrated and hopeless.  He denies any paranoia or any hallucination.  He's not drinking or using any illegal substance.  He is concerned about his mother who has significant memory problem.  He is worried about his physical health.  Current psychiatric medication Amitriptyline 75mg  at bedtime Cymbalta 60 mg daily   Past psychiatric history Patient has one psychiatric admission in June after having significant depression and suicidal thoughts.  He was feeling hopeless and helpless.  Patient denies any previous history of  suicidal attempt however he endorse history of suicidal thoughts in 2003 and 2009.  He never seen psychiatrist but admitted in psychiatric inpatient unit in June 2013.  He has been taking Cymbalta for past one year which is prescribed by his primary care physician Dr. Juanetta Gosling. Patient endorse history of addiction with pain medication.  [facility] has [facility].  Patient denies any history of psychosis or mania.  He do not recall taking any other psychiatric medication.  Family history Patient has a family history of psychiatric illness.  Psychosocial history Patient was born and raised in [place].  He has been married once.  He has 3 children.  He has 72 year old son and 51 and 9 year old daughter.  He has a grandchild who is 32 week old and her other daughter is is still pregnant.  Patient wife works as a Designer, jewellery in hospital.  His mother has memory problem.  Patient  denies any history of physical sexual or verbal abuse.  Education work history Patient has high school education.  Currently he is disabled due to work-related injury which happened 2003.   Alcohol and substance use history Patient endorse history of addiction with opiate and pain medication.  He was given higher dose of narcotic pain medication by Dr. Trey Sailors in the past however [facility].  He is is scared to take any narcotic pain medication and in recent hospitalization his narcotic pain medication was discontinued and he was started on Lidoderm patch and Neurontin.  Patient has a history of alcohol or any illegal substance.  Patient recently quit smoking.  Lab results On February 23 2012.  His WBC count was 11.2, his hemoglobin and platelets were normal.  His chemistry was normal. His TSH is 0.959.  His urine drug screen was positive for benzodiazepine and marijuana.  However patient denies using marijuana.  He was not positive for opiates.  Medical  history Patient has history of hyperlipidemia, degenerative disc disease, GERD and history of back and neck surgery. He sees Dr Juanetta Gosling  Mental status  examination Patient is casually dressed and fairly groomed.  He appears tired but cooperative.  He maintained fair eye contact.  His his speech is slow but decreased volume and tone.  His thought process is also slow but logical linear and goal-directed.  He described his mood is sad and depressed and his affect is constricted.  He denies any auditory or visual hallucination.  He denies any active or passive suicidal thoughts.  He denies any psychotic symptoms.  His fund of knowledge is adequate.  His attention and concentration is fair.  He's alert and oriented x3.  His insight judgment and impulse control is okay.  Assessment Axis I  mood disorder NOS, Maj. 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 Axis IV mild to moderate Axis V 65-75  Plan I talked with the patient in length about his symptoms.  Patient may have underlying mood disorder and recently exacerbated with loss of his father.  He is going through grief process.  I will start Lamictal 25 mg gradually titrated to 50 mg in one week to target his mood lability and anger.  I will continue his Cymbalta 60 mg which he is getting from primary care physician and amitriptyline 75 mg.  I strongly recommend to see his primary care physician for his pain management.  We talk about safety plan that anytime having suicidal thoughts or homicidal thoughts he need to call 911 or go to local emergency room.  Information about crisis hot line numbers given.  Time spent 30 minutes.  I will see him again in 2-3 weeks.  Portion of this note is generated with voice recognition software and may contain typographical error.

## 2012-06-17 ENCOUNTER — Ambulatory Visit (HOSPITAL_COMMUNITY): Payer: Self-pay | Admitting: Psychiatry

## 2012-06-26 ENCOUNTER — Ambulatory Visit (HOSPITAL_COMMUNITY): Payer: Self-pay | Admitting: Psychiatry

## 2012-06-27 ENCOUNTER — Ambulatory Visit (HOSPITAL_COMMUNITY): Payer: Self-pay | Admitting: Psychiatry

## 2012-07-01 ENCOUNTER — Ambulatory Visit (INDEPENDENT_AMBULATORY_CARE_PROVIDER_SITE_OTHER): Payer: Medicare Other | Admitting: Psychiatry

## 2012-07-01 ENCOUNTER — Encounter (HOSPITAL_COMMUNITY): Payer: Self-pay | Admitting: Psychiatry

## 2012-07-01 DIAGNOSIS — F319 Bipolar disorder, unspecified: Secondary | ICD-10-CM

## 2012-07-01 DIAGNOSIS — F329 Major depressive disorder, single episode, unspecified: Secondary | ICD-10-CM

## 2012-07-01 DIAGNOSIS — F1120 Opioid dependence, uncomplicated: Secondary | ICD-10-CM

## 2012-07-01 DIAGNOSIS — F1994 Other psychoactive substance use, unspecified with psychoactive substance-induced mood disorder: Secondary | ICD-10-CM

## 2012-07-01 MED ORDER — QUETIAPINE FUMARATE 50 MG PO TABS: ORAL_TABLET | ORAL | Status: DC

## 2012-07-01 MED ORDER — LAMOTRIGINE 25 MG PO TABS: ORAL_TABLET | ORAL | Status: DC

## 2012-07-01 NOTE — Progress Notes (Signed)
Chief complaint I am very upset on my son-in-law.  He left my daughter .    History of present illness Patient is 56 year old married Caucasian male who came for his appointment with his wife.  Patient came with increased anxiety irritability and anger.  Apparently last week his son-in-law left his baby daughter.  Patient believe his son-in-law is cheating on her.  There has been a lot of family issues.  Patient admitted poor sleep irritability and having thoughts of hurting himself and his son-in-law however he denies any plan.  His wife who is a Engineer, civil (consulting) came with him on his appointment.  I offer voluntary inpatient psychiatric treatment however patient refused.  Patient was admitted to the hospital few months ago however he does not see any improvement.  He endorse chronic family issues.  Patient denies any suicidal or homicidal plan but endorse significant rage and feel that his current medication is not working.  He felt Lamictal did work for some time but since last week he feel the medicine is working.  He has a lot of racing thoughts.  He is also not able to find primary care physician and so far not able to get his pain medication.  He admitted poor attention poor concentration and irritability.  He endorse that he wish his daughter never admitted with his son-in-law.  His daughter has 59-month-old baby .  Patient wife is very supportive and able to calm himself.  She encouraged him to take antipsychotic or mood stabilizer medication.  Patient is willing to try antipsychotic medication to help his anger.  He is not seeing therapist .  He's not drinking or using any illegal substance.  Patient denies any side effects of his current psychiatric medication.    Current psychiatric medication Amitriptyline 75mg  at bedtime Cymbalta 60 mg daily   Past psychiatric history Patient has one psychiatric admission in June after having significant depression and suicidal thoughts.  He was feeling hopeless and  helpless.  Patient denies any previous history of suicidal attempt however he endorse history of suicidal thoughts in 2003 and 2009.  He never seen psychiatrist but admitted in psychiatric inpatient unit in June 2013.  He has been taking Cymbalta for past one year which is prescribed by his primary care physician Dr. Juanetta Gosling. Patient endorse history of addiction with pain medication.  [facility]  Patient denies any history of psychosis or mania.  He do not recall taking any other psychiatric medication.  Family history Patient denies any amily history of psychiatric illness.  Psychosocial history Patient was born and raised in [place].  He has been married once.  He has 3 children.  He has 4 year old son and 45 and 14 year old daughter.  He has 2 grandchildren.  Patient wife works as a Designer, jewellery in hospital.  His mother has memory problem.  Patient  denies any history of physical sexual or verbal abuse.  Education work history Patient has high school education.  Currently he is disabled due to work-related injury which happened 2003.   Alcohol and substance use history Patient endorse history of addiction with opiate and pain medication.  He was given higher dose of narcotic pain medication by Dr. Trey Sailors in the past however [facility].  He is is scared to take any narcotic pain medication and in recent hospitalization his narcotic pain medication was discontinued and he was started on Lidoderm patch and  Neurontin.  Patient has a history of alcohol or any illegal substance.  Patient recently quit smoking.  Medical history Patient has history of hyperlipidemia, degenerative disc disease, GERD and history of back and neck surgery.  He has seen Dr. Juanetta Gosling however recently he's been trying to get new primary care physician.    Mental status  examination Patient is casually dressed and fairly groomed.  He  appears that he tense.  He maintained fair eye contact.  His his speech is slow but clear and coherent.  He endorse passive suicidal thinking and homicidal thinking but denies any plan.  He described his mood is upset and his affect is mood appropriate.  He denies any auditory or visual hallucination.  There were no psychotic symptoms present at this time.  There were no tremors present at this time.  His attention and concentration is fair.  She's alert and oriented x3.  Her insight judgment and impulse control is fair.  Assessment Axis I  mood disorder NOS, Maj. 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 Axis IV mild to moderate Axis V 65-75  Plan I have a long discussion with patient and his wife.  I offer voluntary inpatient psychiatric treatment however patient refused.  We talk about safety plan that if suicidal or homicidal thoughts at intense than he should call 911 or go to local emergency room.  I have provided crisis hotline numbers.  After some discussion patient realized that he need to stay focus on his treatment for his family and his daughter.  He does not want to do anything that has bad consequences.  His wife who is a Engineer, civil (consulting) is very involved in history plan and takes for responsibility about the patient.  His son-in-law is a IT sales professional who lives close by.  His daughter is also living with him.  Wife endorse that multiple family member is living around and he will be watched and supervised very closely.  I recommend to remove all the gun and weapon from the home.  I will discontinue amitriptyline and start Seroquel 50 mg 1-2 tablet at bedtime.  I will also increase Lamictal to 2 tablet daily.  I will see him again in one week.  However I informed that if he started feeling worsening of the symptom that anytime having active suicidal thoughts or  homicidal thoughts and he need to call 911 or go to local emergency room.  Also recommend to see therapist for coping skills.  Greater than 50% of the time spent in counseling and portion of care.  I will see him again in one week.  Portion of this note is generated with voice recognition software and may contain typographical error.

## 2012-07-08 ENCOUNTER — Ambulatory Visit (HOSPITAL_COMMUNITY): Payer: Self-pay | Admitting: Psychiatry

## 2012-07-10 ENCOUNTER — Ambulatory Visit (HOSPITAL_COMMUNITY): Payer: Self-pay | Admitting: Psychiatry

## 2012-07-18 ENCOUNTER — Other Ambulatory Visit (HOSPITAL_COMMUNITY): Payer: Self-pay | Admitting: *Deleted

## 2012-07-18 ENCOUNTER — Other Ambulatory Visit (HOSPITAL_COMMUNITY): Payer: Self-pay | Admitting: Psychiatry

## 2012-07-18 DIAGNOSIS — F319 Bipolar disorder, unspecified: Secondary | ICD-10-CM

## 2012-07-22 ENCOUNTER — Other Ambulatory Visit (HOSPITAL_COMMUNITY): Payer: Self-pay | Admitting: *Deleted

## 2012-07-22 ENCOUNTER — Other Ambulatory Visit (HOSPITAL_COMMUNITY): Payer: Self-pay | Admitting: Psychiatry

## 2012-07-22 DIAGNOSIS — F319 Bipolar disorder, unspecified: Secondary | ICD-10-CM

## 2012-07-22 MED ORDER — QUETIAPINE FUMARATE 50 MG PO TABS: ORAL_TABLET | ORAL | Status: DC

## 2012-08-04 ENCOUNTER — Encounter (HOSPITAL_COMMUNITY): Payer: Self-pay | Admitting: Psychiatry

## 2012-08-04 ENCOUNTER — Ambulatory Visit (INDEPENDENT_AMBULATORY_CARE_PROVIDER_SITE_OTHER): Payer: Medicare Other | Admitting: Psychiatry

## 2012-08-04 VITALS — BP 132/92 | HR 76 | Ht 69.25 in | Wt 236.6 lb

## 2012-08-04 DIAGNOSIS — F5105 Insomnia due to other mental disorder: Secondary | ICD-10-CM

## 2012-08-04 DIAGNOSIS — F319 Bipolar disorder, unspecified: Secondary | ICD-10-CM

## 2012-08-04 DIAGNOSIS — F0631 Mood disorder due to known physiological condition with depressive features: Secondary | ICD-10-CM

## 2012-08-04 DIAGNOSIS — F1994 Other psychoactive substance use, unspecified with psychoactive substance-induced mood disorder: Secondary | ICD-10-CM

## 2012-08-04 DIAGNOSIS — F39 Unspecified mood [affective] disorder: Secondary | ICD-10-CM

## 2012-08-04 DIAGNOSIS — F329 Major depressive disorder, single episode, unspecified: Secondary | ICD-10-CM

## 2012-08-04 DIAGNOSIS — F1120 Opioid dependence, uncomplicated: Secondary | ICD-10-CM

## 2012-08-04 DIAGNOSIS — F411 Generalized anxiety disorder: Secondary | ICD-10-CM

## 2012-08-04 MED ORDER — LAMOTRIGINE 100 MG PO TABS: ORAL_TABLET | ORAL | Status: DC

## 2012-08-04 MED ORDER — AMITRIPTYLINE HCL 25 MG PO TABS: 25.0000 mg | ORAL_TABLET | Freq: Every day | ORAL | Status: DC

## 2012-08-04 MED ORDER — QUETIAPINE FUMARATE 25 MG PO TABS: ORAL_TABLET | ORAL | Status: DC

## 2012-08-04 NOTE — Progress Notes (Signed)
PHYSICIAN OFFICE VISIT [phone] Progress Note Chief complaint I keep getting hit with life problems.  My father dies in 05-28-2023 and my daughter's husband is demanding that she move out of the home.   History of present illness Patient is 56 year old married Caucasian male comes to out patient appointment.  I had treated him on an inpatient basis and got him off Xanax and opiates.  He got out and got restarted on opiates and Xanax.  He is off the Cymbalta and Amitriptyline.  He feels that the calm he gets from Xanax is just the best.  He recalls his medications clearly and with reasonable accuracy.  He appears calm and alert.  He requests refills of the Lamictal and Seroquel.  He comments that he is gaining a lot of weight.  Informed pt that Seroquel can increase the appetite and cause the cholesterol to be high too.  His physician has started him on meds for cholesterol.  He had gotten his cholesterol down when he was considerably younger by diet only.  He has started on Lamictal with out much changes noted.    Current psychiatric medication Lamictal 25 mg TID Seroquel 100 mg HS  Past psychiatric history Patient has one psychiatric admission in June after having significant depression and suicidal thoughts.  He was feeling hopeless and helpless.  Patient denies any previous history of suicidal attempt however he endorse history of suicidal thoughts in 2003 and 2009.  He never seen psychiatrist but admitted in psychiatric inpatient unit in June 2013.  He has been taking Cymbalta for past one year which is prescribed by his primary care physician Dr. Juanetta Gosling. Patient endorse history of addiction with pain medication.  [facility] has [facility].  Patient denies any history of psychosis or mania.  He do not recall taking any other psychiatric medication.  Family history Patient denies any amily history of psychiatric illness.  Psychosocial history Patient was born and raised in [place].  He has been married once.  He has 3 children.  He has 59 year old son and 54 and 33 year old daughter.  He has 2 grandchildren.  Patient wife works as a Designer, jewellery in hospital.  His mother has memory problem.  Patient  denies any history of physical sexual or verbal abuse.  Education work history Patient has high school education.  Currently he is disabled due to work-related injury which happened 2003.   Alcohol and substance use history Patient endorse history of addiction with opiate and pain medication.  He was given higher dose of narcotic pain medication by Dr. Trey Sailors in the past however [facility].  He is is scared to take any narcotic pain medication and in recent hospitalization his narcotic pain medication was discontinued and he was started on Lidoderm patch and Neurontin.  Patient has a history of alcohol or any illegal substance.  Patient recently quit smoking.  Medical history Patient has history of hyperlipidemia, degenerative disc disease, GERD and history of back and neck surgery.  He has seen Dr. Juanetta Gosling however recently he's been trying to get new primary care physician.    Mental status  examination Patient is casually dressed and fairly groomed.  He appears that he tense.  He maintained fair eye contact.  His his speech is slow but clear and coherent.  He endorse passive suicidal thinking and homicidal thinking but denies any plan.  He described his mood is upset and  his affect is mood appropriate.  He denies any auditory or visual hallucination.  There were no psychotic symptoms present at this time.  There were no tremors present at this time.  His attention and concentration is fair.  She's alert and oriented x3.  Her insight judgment and impulse control is fair.  Assessment Axis I  mood disorder NOS, Maj. 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 Axis IV mild to moderate Axis V 65-75  Plan Reviewed chief complaint, vitals, tobacco history, medical history, surgical history, family history, medications, effects and side effects, allergies, problem list, meds to be renewed.  Advised pt that Xanax is addictive and has negative effect on memory and inhibitions.  Also advised him of the memory and depressive effects of opiates.  His cholesterol is elevated.  This could be due to Seroquel.  Will shift off the Seroquel and on to more Lamictal and back on Amitriptyline.  Return to clinic in 1 month for 30 minutes.  Dan Humphreys, Dominyck Reser 08/04/2012 3:41 PM

## 2012-08-04 NOTE — Patient Instructions (Signed)
Xanax is addictive and has negative effect on memory and inhibitions.  Opiates are also addictive and have a negative effect on memory and depression. Your cholesterol is elevated.   This could be due to Seroquel.   Will shift off the Seroquel and on to more Lamictal and back on Amitriptyline.   Return to clinic in 1 month for 30 minutes.

## 2012-08-08 ENCOUNTER — Ambulatory Visit (INDEPENDENT_AMBULATORY_CARE_PROVIDER_SITE_OTHER): Payer: Self-pay | Admitting: Psychiatry

## 2012-08-08 DIAGNOSIS — F39 Unspecified mood [affective] disorder: Secondary | ICD-10-CM

## 2012-08-08 NOTE — Patient Instructions (Signed)
Discussed orally 
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
## 2012-08-29 ENCOUNTER — Ambulatory Visit (INDEPENDENT_AMBULATORY_CARE_PROVIDER_SITE_OTHER): Payer: Medicare Other | Admitting: Psychiatry

## 2012-08-29 DIAGNOSIS — F39 Unspecified mood [affective] disorder: Secondary | ICD-10-CM

## 2012-09-02 NOTE — Patient Instructions (Signed)
Discussed orally 
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
## 2012-09-03 ENCOUNTER — Encounter (HOSPITAL_COMMUNITY): Payer: Self-pay | Admitting: Psychiatry

## 2012-09-03 ENCOUNTER — Ambulatory Visit (INDEPENDENT_AMBULATORY_CARE_PROVIDER_SITE_OTHER): Payer: Self-pay | Admitting: Psychiatry

## 2012-09-03 VITALS — Wt 234.4 lb

## 2012-09-03 DIAGNOSIS — F0631 Mood disorder due to known physiological condition with depressive features: Secondary | ICD-10-CM

## 2012-09-03 DIAGNOSIS — F1994 Other psychoactive substance use, unspecified with psychoactive substance-induced mood disorder: Secondary | ICD-10-CM

## 2012-09-03 DIAGNOSIS — F319 Bipolar disorder, unspecified: Secondary | ICD-10-CM

## 2012-09-03 DIAGNOSIS — F5104 Psychophysiologic insomnia: Secondary | ICD-10-CM | POA: Insufficient documentation

## 2012-09-03 DIAGNOSIS — F411 Generalized anxiety disorder: Secondary | ICD-10-CM

## 2012-09-03 DIAGNOSIS — F5105 Insomnia due to other mental disorder: Secondary | ICD-10-CM

## 2012-09-03 DIAGNOSIS — F1120 Opioid dependence, uncomplicated: Secondary | ICD-10-CM

## 2012-09-03 DIAGNOSIS — F329 Major depressive disorder, single episode, unspecified: Secondary | ICD-10-CM

## 2012-09-03 MED ORDER — LAMOTRIGINE 100 MG PO TABS: ORAL_TABLET | ORAL | Status: DC

## 2012-09-03 MED ORDER — AMITRIPTYLINE HCL 25 MG PO TABS: 75.0000 mg | ORAL_TABLET | Freq: Every day | ORAL | Status: DC

## 2012-09-03 MED ORDER — LIDOCAINE 5 % EX PTCH: 1.0000 | MEDICATED_PATCH | CUTANEOUS | Status: DC

## 2012-09-03 NOTE — Progress Notes (Signed)
PHYSICIAN OFFICE VISIT [phone] Progress Note Chief complaint Chief Complaint  Patient presents with  . Follow-up  . Medication Refill  . Depression   Subjective: "I'm going through a separation.  My wife got fired from jobs twice this year.  I can't keep doing that".   History of present illness Patient is 56 year old married Caucasian male comes to out patient appointment. Pt reports that he is compliant with the psychotropic medications with maybe some benefit and no noticible side effects.  His wife has been transferring equity from the house to the checking account and then spend that money.  He has decided to cut the wires to the computer and then went to the bank and took her name off the checking and equity line.  We discussed him seeking the support of the 12 Steps through [facility].  Current psychiatric medication Lamictal 25 mg TID Seroquel 100 mg HS  Past psychiatric history Patient has one psychiatric admission in June after having significant depression and suicidal thoughts.  He was feeling hopeless and helpless.  Patient denies any previous history of suicidal attempt however he endorse history of suicidal thoughts in 2003 and 2009.  He never seen psychiatrist but admitted in psychiatric inpatient unit in June 2013.  He has been taking Cymbalta for past one year which is prescribed by his primary care physician Dr. Juanetta Gosling. Patient endorse history of addiction with pain medication.  [facility]  Patient denies any history of psychosis or mania.  He do not recall taking any other psychiatric medication.  Family history Patient denies any amily history of psychiatric illness.  Psychosocial history Patient was born and raised in [place].  He has been married once.  He has 3 children.  He has 36 year old son and 60 and 25 year old daughter.  He has 2 grandchildren.  Patient wife works as a Designer, jewellery in hospital.  His mother has memory  problem.  Patient  denies any history of physical sexual or verbal abuse.  Education work history Patient has high school education.  Currently he is disabled due to work-related injury which happened 2003.   Alcohol and substance use history Patient endorse history of addiction with opiate and pain medication.  He was given higher dose of narcotic pain medication by Dr. Trey Sailors in the past however [facility].  He is is scared to take any narcotic pain medication and in recent hospitalization his narcotic pain medication was discontinued and he was started on Lidoderm patch and Neurontin.  Patient has a history of alcohol or any illegal substance.  Patient recently quit smoking.  Medical history Patient has history of hyperlipidemia, degenerative disc disease, GERD and history of back and neck surgery.  He has seen Dr. Juanetta Gosling however recently he's been trying to get new primary care physician.    Mental status  examination Patient is casually dressed and fairly groomed.  He appears that he tense.  He maintained fair eye contact.  His his speech is slow but clear and coherent.  He endorse passive suicidal thinking and homicidal thinking but denies any plan.  He described his mood is upset and his affect is mood appropriate.  He denies any auditory or visual hallucination.  There were no psychotic symptoms present at this time.  There were no tremors present at this time.  His attention and concentration is fair.  She's alert and oriented x3.  Her insight  judgment and impulse control is fair.  Assessment Axis I  mood disorder NOS, Maj. 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 Axis IV mild to moderate Axis V 65-75  Plan I took his vitals.  I reviewed CC, tobacco/med/surg Hx, meds effects/ side effects, problem list,  therapies and responses as well as current situation/symptoms discussed options. See orders and pt instructions for more details.  Laquonda Welby 09/03/2012 2:00 PM

## 2012-09-03 NOTE — Patient Instructions (Signed)
Strongly consider attending at least 6 [employer] Meetings to help you learn about how your helping others to the exclusion of helping yourself is actually hurting yourself and is actually an addiction to fixing others and that you need to work the 12 Step to Happiness through the [employer]. [facility] Family Groups could be helpful with how to deal with substance abusing family and friends. Or your own issues of being in victim role.  There are only 40 [facility] Family Group meetings a week here in [place].  Online are current listing of those meetings @ [email or web address]  There are [employer].  Search on line and there you can learn the format and can access the schedule for yourself.  Their number is [phone]  The one in [place] is at the [facility] on [address]  The meeting is at 7 PM on Tuesdays.   Have a happy holiday!

## 2012-09-10 ENCOUNTER — Telehealth (HOSPITAL_COMMUNITY): Payer: Self-pay | Admitting: Psychiatry

## 2012-09-10 NOTE — Telephone Encounter (Signed)
Phone message completed in the phone message section.  

## 2012-09-12 ENCOUNTER — Ambulatory Visit (INDEPENDENT_AMBULATORY_CARE_PROVIDER_SITE_OTHER): Payer: Medicare Other | Admitting: Psychiatry

## 2012-09-12 DIAGNOSIS — F39 Unspecified mood [affective] disorder: Secondary | ICD-10-CM

## 2012-09-12 NOTE — Patient Instructions (Addendum)
Shoe box technique .Marland Kitchen Memories, special items, photographs, etc  Gratitude Journal  [facility]  Follow through with plan to contact creditors
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
## 2012-10-03 ENCOUNTER — Ambulatory Visit (HOSPITAL_COMMUNITY): Payer: Self-pay | Admitting: Psychiatry

## 2012-10-03 ENCOUNTER — Telehealth (HOSPITAL_COMMUNITY): Payer: Self-pay

## 2012-10-15 ENCOUNTER — Ambulatory Visit (INDEPENDENT_AMBULATORY_CARE_PROVIDER_SITE_OTHER): Payer: Medicare Other | Admitting: Psychiatry

## 2012-10-15 ENCOUNTER — Encounter (HOSPITAL_COMMUNITY): Payer: Self-pay | Admitting: Psychiatry

## 2012-10-15 VITALS — Wt 232.2 lb

## 2012-10-15 DIAGNOSIS — F319 Bipolar disorder, unspecified: Secondary | ICD-10-CM

## 2012-10-15 DIAGNOSIS — F3289 Other specified depressive episodes: Secondary | ICD-10-CM

## 2012-10-15 DIAGNOSIS — F1994 Other psychoactive substance use, unspecified with psychoactive substance-induced mood disorder: Secondary | ICD-10-CM

## 2012-10-15 DIAGNOSIS — F0631 Mood disorder due to known physiological condition with depressive features: Secondary | ICD-10-CM

## 2012-10-15 DIAGNOSIS — F411 Generalized anxiety disorder: Secondary | ICD-10-CM

## 2012-10-15 DIAGNOSIS — F329 Major depressive disorder, single episode, unspecified: Secondary | ICD-10-CM

## 2012-10-15 DIAGNOSIS — F1120 Opioid dependence, uncomplicated: Secondary | ICD-10-CM

## 2012-10-15 DIAGNOSIS — E785 Hyperlipidemia, unspecified: Secondary | ICD-10-CM

## 2012-10-15 DIAGNOSIS — F5105 Insomnia due to other mental disorder: Secondary | ICD-10-CM

## 2012-10-15 MED ORDER — LAMOTRIGINE 100 MG PO TABS: ORAL_TABLET | ORAL | Status: DC

## 2012-10-15 MED ORDER — LIDOCAINE 5 % EX PTCH: 1.0000 | MEDICATED_PATCH | CUTANEOUS | Status: DC

## 2012-10-15 NOTE — Progress Notes (Signed)
[facility] [id number] Progress Note SENAI KINGSLEY MRN: [id number] DOB: [birth year] Age: 57 y.o.  Date: 10/15/2012 Start Time: 2:10 PM End Time: 2:28 PM  Chief Complaint: Chief Complaint  Patient presents with  . Depression  . Follow-up  . Medication Refill   Subjective: "I'm doing pretty good".   History of present illness Patient is 57 year old married Caucasian male comes to out patient appointment. Pt reports that he is compliant with the psychotropic medications with good benefit and no noticeable side effects.  He and his wife are trying to work things out.  He is not sleeping the nights he changes his Fentanyl patch.  He had a reaction to the Elavil.  Chart marked as such.    Current psychiatric medication Lamictal 25 mg TID Cymbalta 60 mg from family doctor Xanax 1 mg for insomnia prescribed by family doctor  Past psychiatric history Patient has one psychiatric admission in June after having significant depression and suicidal thoughts.  He was feeling hopeless and helpless.  Patient denies any previous history of suicidal attempt however he endorse history of suicidal thoughts in 2003 and 2009.  He never seen psychiatrist but admitted in psychiatric inpatient unit in June 2013.  He has been taking Cymbalta for past one year which is prescribed by his primary care physician Dr. Juanetta Gosling. Patient endorse history of addiction with pain medication.  [facility]  Patient denies any history of psychosis or mania.  He do not recall taking any other psychiatric medication.  Family history Patient denies any amily history of psychiatric illness. family history includes Alcohol abuse in his paternal uncle; Anxiety disorder in his sister; Bipolar disorder in his father; Dementia in his father and mother; Drug abuse in his paternal uncle; Hypertension in his brothers and sister; and Paranoid behavior in his mother.  There is no history of ADD /  ADHD, and Depression, and OCD, and Schizophrenia, and Seizures, and Sexual abuse, and Physical abuse, .  Psychosocial history Patient was born and raised in [place].  He has been married once.  He has 3 children.  He has 6 year old son and 14 and 39 year old daughter.  He has 2 grandchildren.  Patient wife works as a Designer, jewellery in hospital.  His mother has memory problem.  Patient  denies any history of physical sexual or verbal abuse.  Education work history Patient has high school education.  Currently he is disabled due to work-related injury which happened 2003.   Alcohol and substance use history Patient endorse history of addiction with opiate and pain medication.  He was given higher dose of narcotic pain medication by Dr. Trey Sailors in the past however [facility].  He is is scared to take any narcotic pain medication and in recent hospitalization his narcotic pain medication was discontinued and he was started on Lidoderm patch and Neurontin.  Patient has a history of alcohol or any illegal substance.  Patient recently quit smoking.  Medical history Patient has history of hyperlipidemia, degenerative disc disease, GERD and history of back and neck surgery.  He has seen Dr. Juanetta Gosling however recently he's been trying to get new primary care physician.    Mental status  examination Patient is casually dressed and fairly groomed.  He appears that he tense.  He maintained fair eye contact.  His his speech is slow but clear and coherent.  He endorse passive suicidal thinking and homicidal  thinking but denies any plan.  He described his mood is upset and his affect is mood appropriate.  He denies any auditory or visual hallucination.  There were no psychotic symptoms present at this time.  There were no tremors present at this time.  His attention and concentration is fair.  She's alert and oriented x3.  Her insight judgment and impulse control is  fair.  Assessment Axis I  mood disorder NOS, Maj. 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 Axis IV mild to moderate Axis V 65-75  Plan: I took his vitals.  I reviewed CC, tobacco/med/surg Hx, meds effects/ side effects, problem list, therapies and responses as well as current situation/symptoms discussed options. See orders and pt instructions for more details.  Medical Decision Making Problem Points:  Established problem, stable/improving (1), Review of last therapy session (1), Review of psycho-social stressors (1) and Self-limited or minor (1) Data Points:  Review of medication regiment & side effects (2)  I certify that outpatient services furnished can reasonably be expected to improve the patient's condition.   Orson Aloe, MD, [facility]

## 2012-10-15 NOTE — Patient Instructions (Signed)
Keep it up.  Take care of yourself.  No one is standing up to do that for you and only you know what yo need.   Call if problems or concerns.

## 2012-10-28 ENCOUNTER — Ambulatory Visit (HOSPITAL_COMMUNITY): Payer: Self-pay | Admitting: Psychiatry

## 2013-01-13 ENCOUNTER — Ambulatory Visit (HOSPITAL_COMMUNITY): Payer: Self-pay | Admitting: Psychiatry

## 2014-04-02 ENCOUNTER — Encounter (INDEPENDENT_AMBULATORY_CARE_PROVIDER_SITE_OTHER): Payer: Self-pay | Admitting: *Deleted

## 2014-04-09 ENCOUNTER — Other Ambulatory Visit (INDEPENDENT_AMBULATORY_CARE_PROVIDER_SITE_OTHER): Payer: Self-pay | Admitting: *Deleted

## 2014-04-09 ENCOUNTER — Encounter (INDEPENDENT_AMBULATORY_CARE_PROVIDER_SITE_OTHER): Payer: Self-pay | Admitting: *Deleted

## 2014-04-09 DIAGNOSIS — Z1211 Encounter for screening for malignant neoplasm of colon: Secondary | ICD-10-CM

## 2014-05-26 ENCOUNTER — Telehealth (INDEPENDENT_AMBULATORY_CARE_PROVIDER_SITE_OTHER): Payer: Self-pay | Admitting: *Deleted

## 2014-05-26 DIAGNOSIS — Z1211 Encounter for screening for malignant neoplasm of colon: Secondary | ICD-10-CM

## 2014-05-26 NOTE — Telephone Encounter (Signed)
Patient needs movi prep 

## 2014-05-28 MED ORDER — PEG-KCL-NACL-NASULF-NA ASC-C 100 G PO SOLR: 1.0000 | Freq: Once | ORAL | Status: DC

## 2014-06-09 ENCOUNTER — Telehealth (INDEPENDENT_AMBULATORY_CARE_PROVIDER_SITE_OTHER): Payer: Self-pay | Admitting: *Deleted

## 2014-06-09 NOTE — Telephone Encounter (Signed)
agree

## 2014-06-09 NOTE — Telephone Encounter (Signed)
  Procedure: tcs  Reason/Indication:  screening  Has patient had this procedure before?  Yes, 2005 -- epic  If so, when, by whom and where?    Is there a family history of colon cancer?  no  Who?  What age when diagnosed?    Is patient diabetic?   no      Does patient have prosthetic heart valve?  no  Do you have a pacemaker?  no  Has patient ever had endocarditis? no  Has patient had joint replacement within last 12 months?  no  Does patient tend to be constipated or take laxatives? occassionally  Is patient on Coumadin, Plavix and/or Aspirin? no  Medications: ibuprofen prn, cymbalta 60 mg daily, lamictal 45 mg 1 1/2 tab daily, fentanyl patch 50 mcg changes every 72 hrs, xanax 0.5 mg bid prn  Allergies: nkda  Medication Adjustment:   Procedure date & time: 06/30/14 at 830

## 2014-06-30 ENCOUNTER — Encounter (HOSPITAL_COMMUNITY)
Admission: RE | Disposition: A | Discharge status: Home / Self Care | Payer: Self-pay | Source: Ambulatory Visit | Attending: Internal Medicine

## 2014-06-30 ENCOUNTER — Ambulatory Visit (HOSPITAL_COMMUNITY)
Admission: RE | Admit: 2014-06-30 | Discharge: 2014-06-30 | Disposition: A | Discharge status: Home / Self Care | Payer: Medicare Other | Source: Ambulatory Visit | Attending: Internal Medicine | Admitting: Internal Medicine

## 2014-06-30 ENCOUNTER — Encounter (HOSPITAL_COMMUNITY): Payer: Self-pay | Admitting: *Deleted

## 2014-06-30 DIAGNOSIS — D122 Benign neoplasm of ascending colon: Secondary | ICD-10-CM | POA: Insufficient documentation

## 2014-06-30 DIAGNOSIS — G4700 Insomnia, unspecified: Secondary | ICD-10-CM | POA: Diagnosis not present

## 2014-06-30 DIAGNOSIS — E785 Hyperlipidemia, unspecified: Secondary | ICD-10-CM | POA: Insufficient documentation

## 2014-06-30 DIAGNOSIS — Z1211 Encounter for screening for malignant neoplasm of colon: Secondary | ICD-10-CM

## 2014-06-30 DIAGNOSIS — F17210 Nicotine dependence, cigarettes, uncomplicated: Secondary | ICD-10-CM | POA: Insufficient documentation

## 2014-06-30 DIAGNOSIS — Z888 Allergy status to other drugs, medicaments and biological substances status: Secondary | ICD-10-CM | POA: Insufficient documentation

## 2014-06-30 DIAGNOSIS — K644 Residual hemorrhoidal skin tags: Secondary | ICD-10-CM | POA: Diagnosis not present

## 2014-06-30 DIAGNOSIS — K635 Polyp of colon: Secondary | ICD-10-CM

## 2014-06-30 DIAGNOSIS — F419 Anxiety disorder, unspecified: Secondary | ICD-10-CM | POA: Diagnosis not present

## 2014-06-30 DIAGNOSIS — F329 Major depressive disorder, single episode, unspecified: Secondary | ICD-10-CM | POA: Diagnosis not present

## 2014-06-30 HISTORY — PX: COLONOSCOPY: SHX5424

## 2014-06-30 SURGERY — COLONOSCOPY
Anesthesia: Moderate Sedation

## 2014-06-30 MED ORDER — MEPERIDINE HCL 50 MG/ML IJ SOLN
INTRAMUSCULAR | Status: AC
  Filled 2014-06-30: qty 1

## 2014-06-30 MED ORDER — MIDAZOLAM HCL 5 MG/5ML IJ SOLN
INTRAMUSCULAR | Status: AC
  Filled 2014-06-30: qty 10

## 2014-06-30 MED ORDER — MEPERIDINE HCL 50 MG/ML IJ SOLN
INTRAMUSCULAR | Status: DC | PRN
  Administered 2014-06-30 (×3): 25 mg via INTRAVENOUS

## 2014-06-30 MED ORDER — MIDAZOLAM HCL 5 MG/5ML IJ SOLN
INTRAMUSCULAR | Status: DC | PRN
  Administered 2014-06-30: 2 mg via INTRAVENOUS
  Administered 2014-06-30 (×3): 3 mg via INTRAVENOUS

## 2014-06-30 MED ORDER — SODIUM CHLORIDE 0.9 % IV SOLN
INTRAVENOUS | Status: DC
  Administered 2014-06-30: 08:00:00 via INTRAVENOUS

## 2014-06-30 NOTE — Discharge Instructions (Signed)
No aspirin or NSAIDs for 1 week. Resume usual medications and diet. No driving for 24 hours.   Colonoscopy, Care After Refer to this sheet in the next few weeks. These instructions provide you with information on caring for yourself after your procedure. Your health care provider may also give you more specific instructions. Your treatment has been planned according to current medical practices, but problems sometimes occur. Call your health care provider if you have any problems or questions after your procedure. WHAT TO EXPECT AFTER THE PROCEDURE  After your procedure, it is typical to have the following:  A small amount of blood in your stool.  Moderate amounts of gas and mild abdominal cramping or bloating. HOME CARE INSTRUCTIONS  Do not drive, operate machinery, or sign important documents for 24 hours.  You may shower and resume your regular physical activities, but move at a slower pace for the first 24 hours.  Take frequent rest periods for the first 24 hours.  Walk around or put a warm pack on your abdomen to help reduce abdominal cramping and bloating.  Drink enough fluids to keep your urine clear or pale yellow.  You may resume your normal diet as instructed by your health care provider. Avoid heavy or fried foods that are hard to digest.  Avoid drinking alcohol for 24 hours or as instructed by your health care provider.  Only take over-the-counter or prescription medicines as directed by your health care provider.  If a tissue sample (biopsy) was taken during your procedure:  Do not take aspirin or blood thinners for 7 days, or as instructed by your health care provider.  Do not drink alcohol for 7 days, or as instructed by your health care provider.  Eat soft foods for the first 24 hours. SEEK MEDICAL CARE IF: You have persistent spotting of blood in your stool 2-3 days after the procedure. SEEK IMMEDIATE MEDICAL CARE IF:  You have more than a small spotting of  blood in your stool.  You pass large blood clots in your stool.  Your abdomen is swollen (distended).  You have nausea or vomiting.  You have a fever.  You have increasing abdominal pain that is not relieved with medicine. Document Released: 04/24/2004 Document Revised: 07/01/2013 Document Reviewed: 05/18/2013 [facility] Patient Information 2015 [place], [place]. This information is not intended to replace advice given to you by your health care provider. Make sure you discuss any questions you have with your health care provider.

## 2014-06-30 NOTE — H&P (Signed)
Stephen Porter is an 58 y.o. male.   Chief Complaint: Patient is here for colonoscopy. HPI: Patient is 58 year old Caucasian male who is here for screening colonoscopy. Last exam was 10 years. He denies change in bowel habits rectal bleeding or abdominal pain. Family history is negative for CRC.  Past Medical History  Diagnosis Date  . Chest pain   . Tobacco abuse   . Hyperlipidemia   . Anxiety and depression   . Insomnia   . DDD (degenerative disc disease)   . Jejunal ulcer   . Depression   . Skin rash     Past Surgical History  Procedure Laterality Date  . Lumbar spine surgery    . Cervical spine surgery      x3  . Cholecystectomy    . Colonoscopy  2006  . Back surgery      Family History  Problem Relation Age of Onset  . Hypertension Sister   . Anxiety disorder Sister   . Hypertension Brother   . Hypertension Brother   . Hypertension Brother   . Dementia Mother   . Paranoid behavior Mother   . Bipolar disorder Father   . Dementia Father   . Alcohol abuse Paternal Uncle   . ADD / ADHD Neg Hx   . Depression Neg Hx   . OCD Neg Hx   . Schizophrenia Neg Hx   . Seizures Neg Hx   . Sexual abuse Neg Hx   . Physical abuse Neg Hx   . Colon cancer Neg Hx   . Drug abuse Paternal Uncle    Social History:  reports that he has been smoking Cigarettes.  He has a 34 pack-year smoking history. He does not have any smokeless tobacco history on file. He reports that he does not drink alcohol or use illicit drugs.  Allergies:  Allergies  Allergen Reactions  . Elavil [Amitriptyline] Other (See Comments)    Shaky    Medications Prior to Admission  Medication Sig Dispense Refill  . ALPRAZolam (XANAX) 1 MG tablet Take 1 mg by mouth Twice daily.      . CYMBALTA 60 MG capsule       . fentaNYL (DURAGESIC - DOSED MCG/HR) 50 MCG/HR Apply 50 mcg/hr topically every 3 (three) days.      Marland Kitchen lamoTRIgine (LAMICTAL) 100 MG tablet Take by mouth 1/2 in AM and 1 in the evening ongoing.  45  tablet  2  . peg 3350 powder (MOVIPREP) 100 G SOLR Take 1 kit (200 g total) by mouth once.  1 kit  0  . lidocaine (LIDODERM) 5 % Place 1 patch onto the skin daily. Remove & Discard patch within 12 hours or as directed by MD  30 patch  2  . WELCHOL 3.75 G PACK Take 3.75 g by mouth Daily.        No results found for this or any previous visit (from the past 48 hour(s)). No results found.  ROS  Blood pressure 133/88, pulse 71, temperature 97.9 F (36.6 C), temperature source Oral, resp. rate 20, height '5\' 10"'  (1.778 m), weight 215 lb (97.523 kg), SpO2 95.00%. Physical Exam  Constitutional: He appears well-developed and well-nourished.  HENT:  Mouth/Throat: Oropharynx is clear and moist.  Eyes: Conjunctivae are normal. No scleral icterus.  Neck: No thyromegaly present.  Cardiovascular: Normal rate, regular rhythm and normal heart sounds.   No murmur heard. Respiratory: Effort normal and breath sounds normal.  GI: Soft. He exhibits no distension  and no mass. There is no tenderness.  Musculoskeletal: He exhibits no edema.  Lymphadenopathy:    He has no cervical adenopathy.  Neurological: He is alert.  Skin: Skin is warm and dry.     Assessment/Plan Average risk screening colonoscopy.  Taiyana Kissler U 06/30/2014, 8:25 AM

## 2014-06-30 NOTE — Op Note (Signed)
[facility] [address] [place], [address]   COLONOSCOPY PROCEDURE REPORT     EXAM DATE: 06/30/2014  PATIENT NAME:      Stephen Porter, Stephen Porter           MR #:      [id number]  BIRTHDATE:       12/24/1955      VISIT #:     [phone]  ATTENDING:     Hildred Laser, MD     STATUS:     outpatient REFERRING MD:      Monico Blitz, M.D. ASA CLASS:        Class I  INDICATIONS:  The patient is a 58 yr old male here for a colonoscopy due to average risk for colorectal cancer. PROCEDURE PERFORMED:     Colonoscopy, screening, Colonoscopy with snare polypectomy, and Colonoscopy with ablation MEDICATIONS:     Cetacaine spray for oral pharyngeal topical anesthesia, Meperidine (Demerol) 75 mg IV, and Versed 11 mg IV  ESTIMATED BLOOD LOSS:     None  CONSENT: The patient understands the risks and benefits of the procedure and understands that these risks include, but are not limited to: sedation, allergic reaction, infection, perforation and/or bleeding. Alternative means of evaluation and treatment include, among others: physical exam, x-rays, and/or surgical intervention. The patient elects to proceed with this endoscopic procedure.  DESCRIPTION OF PROCEDURE: During intra-op preparation period all mechanical & medical equipment was checked for proper function. Hand hygiene and appropriate measures for infection prevention was taken. After the risks, benefits and alternatives of the procedure were thoroughly explained, Informed consent was verified, confirmed and timeout was successfully executed by the treatment team. A digital exam revealed no abnormalities of the rectum.      The [id number] ([id number]) endoscope was introduced through the anus and advanced to the cecum, which was identified by both the appendix and ileocecal valve. No adverse events experienced. The prep was excellent.. The instrument was then slowly withdrawn as the colon was fully examined.   COLON  FINDINGS: A sessile polyp measuring 6 mm in size was found in the ascending colon.  A polypectomy was performed using snare cautery.  The resection was complete, the polyp tissue was completely retrieved and sent to histology.   A sessile polyp measuring 7 mm in size was found at the hepatic flexure.  A polypectomy was performed using snare cautery.  The resection was complete, the polyp tissue was completely retrieved and sent to histology.   A single polyp measuring 3 mm in size was found at the hepatic flexure.  Destruction of lesion via ablation was attempted. Small external hemorrhoids were found. Retroflexed views revealed no abnormalities.  The scope was then completely withdrawn from the patient and the procedure terminated. WITHDRAWAL TIME: 10 minutes 4 seconds    ADVERSE EVENTS:      There were no immediate complications.  IMPRESSIONS:     1.  Sessile polyp was found in the ascending colon; polypectomy was performed using snare cautery 2.  Sessile polyp was found at the hepatic flexure; polypectomy was performed using snare cautery 3.  Single polyp was found at the hepatic flexure; Destruction of lesion via ablation was attempted 4.  Small external hemorrhoids 5.  Both polyps were submitted together  RECOMMENDATIONS:     1.  Hold aspirin, aspirin products, and anti-inflammatory medication for 1 week. 2.  Await biopsy results RECALL:  Hildred Laser, MD eSigned:  Hildred Laser, MD 06/30/2014 9:05 AM  cc:  CPT CODES: ICD CODES:  The ICD and CPT codes recommended by this software are interpretations from the data that the clinical staff has captured with the software.  The verification of the translation of this report to the ICD and CPT codes and modifiers is the sole responsibility of the health care institution and practicing physician where this report was generated.  [employer] will not be held responsible for the validity of the ICD  and CPT codes included on this report.  AMA assumes no liability for data contained or not contained herein. CPT is a Designer, television/film set of the [employer].   PATIENT NAME:  Stephen Porter MR#: [id number]

## 2014-07-02 ENCOUNTER — Encounter (HOSPITAL_COMMUNITY): Payer: Self-pay | Admitting: Internal Medicine

## 2014-07-21 ENCOUNTER — Encounter (INDEPENDENT_AMBULATORY_CARE_PROVIDER_SITE_OTHER): Payer: Self-pay | Admitting: *Deleted

## 2015-01-26 ENCOUNTER — Encounter (HOSPITAL_COMMUNITY): Payer: Self-pay | Admitting: *Deleted

## 2015-01-26 ENCOUNTER — Emergency Department (HOSPITAL_COMMUNITY): Payer: Medicare Other

## 2015-01-26 ENCOUNTER — Emergency Department (HOSPITAL_COMMUNITY)
Admission: EM | Admit: 2015-01-26 | Discharge: 2015-01-26 | Disposition: A | Discharge status: Home / Self Care | Payer: Medicare Other | Attending: Emergency Medicine | Admitting: Emergency Medicine

## 2015-01-26 DIAGNOSIS — F419 Anxiety disorder, unspecified: Secondary | ICD-10-CM | POA: Insufficient documentation

## 2015-01-26 DIAGNOSIS — Z720 Tobacco use: Secondary | ICD-10-CM | POA: Diagnosis not present

## 2015-01-26 DIAGNOSIS — R531 Weakness: Secondary | ICD-10-CM | POA: Diagnosis present

## 2015-01-26 DIAGNOSIS — F329 Major depressive disorder, single episode, unspecified: Secondary | ICD-10-CM | POA: Insufficient documentation

## 2015-01-26 DIAGNOSIS — Z8719 Personal history of other diseases of the digestive system: Secondary | ICD-10-CM | POA: Insufficient documentation

## 2015-01-26 DIAGNOSIS — Z79899 Other long term (current) drug therapy: Secondary | ICD-10-CM | POA: Diagnosis not present

## 2015-01-26 DIAGNOSIS — I10 Essential (primary) hypertension: Secondary | ICD-10-CM | POA: Diagnosis not present

## 2015-01-26 DIAGNOSIS — Z8639 Personal history of other endocrine, nutritional and metabolic disease: Secondary | ICD-10-CM | POA: Insufficient documentation

## 2015-01-26 DIAGNOSIS — R079 Chest pain, unspecified: Secondary | ICD-10-CM | POA: Diagnosis not present

## 2015-01-26 LAB — URINALYSIS, ROUTINE W REFLEX MICROSCOPIC
Bilirubin Urine: NEGATIVE
Glucose, UA: NEGATIVE mg/dL
Hgb urine dipstick: NEGATIVE
Ketones, ur: NEGATIVE mg/dL
Leukocytes, UA: NEGATIVE
Nitrite: NEGATIVE
Protein, ur: NEGATIVE mg/dL
SPECIFIC GRAVITY, URINE: 1.01 (ref 1.005–1.030)
Urobilinogen, UA: 0.2 mg/dL (ref 0.0–1.0)
pH: 6.5 (ref 5.0–8.0)

## 2015-01-26 LAB — COMPREHENSIVE METABOLIC PANEL
ALT: 34 U/L (ref 17–63)
AST: 26 U/L (ref 15–41)
Albumin: 4 g/dL (ref 3.5–5.0)
Alkaline Phosphatase: 97 U/L (ref 38–126)
Anion gap: 8 (ref 5–15)
BUN: 11 mg/dL (ref 6–20)
CO2: 24 mmol/L (ref 22–32)
Calcium: 9.2 mg/dL (ref 8.9–10.3)
Chloride: 109 mmol/L (ref 101–111)
Creatinine, Ser: 0.75 mg/dL (ref 0.61–1.24)
GFR calc Af Amer: >60 mL/min (ref >60)
GFR calc non Af Amer: >60 mL/min (ref >60)
Glucose, Bld: 111 mg/dL — ABNORMAL HIGH (ref 70–99)
Potassium: 3.5 mmol/L (ref 3.5–5.1)
Sodium: 141 mmol/L (ref 135–145)
Total Bilirubin: 1.1 mg/dL (ref 0.3–1.2)
Total Protein: 7 g/dL (ref 6.5–8.1)

## 2015-01-26 LAB — CBC WITH DIFFERENTIAL/PLATELET
Basophils Absolute: 0 10*3/uL (ref 0.0–0.1)
Basophils Relative: 0 % (ref 0–1)
Eosinophils Absolute: 0.2 10*3/uL (ref 0.0–0.7)
Eosinophils Relative: 2 % (ref 0–5)
HCT: 45.5 % (ref 39.0–52.0)
Hemoglobin: 15.7 g/dL (ref 13.0–17.0)
Lymphocytes Relative: 17 % (ref 12–46)
Lymphs Abs: 1.7 10*3/uL (ref 0.7–4.0)
MCH: 30.1 pg (ref 26.0–34.0)
MCHC: 34.5 g/dL (ref 30.0–36.0)
MCV: 87.3 fL (ref 78.0–100.0)
Monocytes Absolute: 0.6 10*3/uL (ref 0.1–1.0)
Monocytes Relative: 6 % (ref 3–12)
Neutro Abs: 7.4 10*3/uL (ref 1.7–7.7)
Neutrophils Relative %: 75 % (ref 43–77)
Platelets: 200 10*3/uL (ref 150–400)
RBC: 5.21 MIL/uL (ref 4.22–5.81)
RDW: 13.3 % (ref 11.5–15.5)
WBC: 9.9 10*3/uL (ref 4.0–10.5)

## 2015-01-26 LAB — I-STAT TROPONIN, ED: Troponin i, poc: 0 ng/mL (ref 0.00–0.08)

## 2015-01-26 LAB — BRAIN NATRIURETIC PEPTIDE: B Natriuretic Peptide: 30 pg/mL (ref 0.0–100.0)

## 2015-01-26 LAB — TROPONIN I: Troponin I: <0.03 ng/mL (ref <0.031)

## 2015-01-26 LAB — LIPASE, BLOOD: Lipase: 24 U/L (ref 22–51)

## 2015-01-26 MED ORDER — IOHEXOL 350 MG/ML SOLN
100.0000 mL | Freq: Once | INTRAVENOUS | Status: AC | PRN
  Administered 2015-01-26: 100 mL via INTRAVENOUS

## 2015-01-26 MED ORDER — ASPIRIN 81 MG PO CHEW: 81.0000 mg | CHEWABLE_TABLET | Freq: Every day | ORAL | Status: DC

## 2015-01-26 MED ORDER — SODIUM CHLORIDE 0.9 % IV BOLUS (SEPSIS)
1000.0000 mL | Freq: Once | INTRAVENOUS | Status: AC
  Administered 2015-01-26: 1000 mL via INTRAVENOUS

## 2015-01-26 MED ORDER — MORPHINE SULFATE 4 MG/ML IJ SOLN
4.0000 mg | Freq: Once | INTRAMUSCULAR | Status: AC
Start: 2015-01-26 — End: 2015-01-26
  Administered 2015-01-26: 4 mg via INTRAVENOUS

## 2015-01-26 MED ORDER — ONDANSETRON HCL 4 MG/2ML IJ SOLN
4.0000 mg | Freq: Once | INTRAMUSCULAR | Status: AC
  Administered 2015-01-26: 4 mg via INTRAVENOUS
  Filled 2015-01-26: qty 2

## 2015-01-26 MED ORDER — AMLODIPINE BESYLATE 5 MG PO TABS: 5.0000 mg | ORAL_TABLET | Freq: Every day | ORAL | Status: DC

## 2015-01-26 MED ORDER — IOHEXOL 300 MG/ML  SOLN
25.0000 mL | Freq: Once | INTRAMUSCULAR | Status: AC | PRN
  Administered 2015-01-26: 25 mL via ORAL

## 2015-01-26 MED ORDER — MORPHINE SULFATE 4 MG/ML IJ SOLN
4.0000 mg | Freq: Once | INTRAMUSCULAR | Status: DC
  Filled 2015-01-26: qty 1

## 2015-01-26 MED ORDER — MORPHINE SULFATE 4 MG/ML IJ SOLN
4.0000 mg | Freq: Once | INTRAMUSCULAR | Status: AC
  Administered 2015-01-26: 4 mg via INTRAVENOUS
  Filled 2015-01-26: qty 1

## 2015-01-26 MED ORDER — IOHEXOL 300 MG/ML  SOLN
100.0000 mL | Freq: Once | INTRAMUSCULAR | Status: AC | PRN
  Administered 2015-01-26: 100 mL via INTRAVENOUS

## 2015-01-26 NOTE — Discharge Instructions (Signed)
Chest Pain (Nonspecific) You will be called by the cardiologist to schedule an appointment for a stress test. Take the blood pressure medication as prescribed as well as 81 mg of aspirin daily return to the ED with worsening chest pain, shortness of breath or any other concerns.. It is often hard to give a specific diagnosis for the cause of chest pain. There is always a chance that your pain could be related to something serious, such as a heart attack or a blood clot in the lungs. You need to follow up with your health care provider for further evaluation. CAUSES   Heartburn.  Pneumonia or bronchitis.  Anxiety or stress.  Inflammation around your heart (pericarditis) or lung (pleuritis or pleurisy).  A blood clot in the lung.  A collapsed lung (pneumothorax). It can develop suddenly on its own (spontaneous pneumothorax) or from trauma to the chest.  Shingles infection (herpes zoster virus). The chest wall is composed of bones, muscles, and cartilage. Any of these can be the source of the pain.  The bones can be bruised by injury.  The muscles or cartilage can be strained by coughing or overwork.  The cartilage can be affected by inflammation and become sore (costochondritis). DIAGNOSIS  Lab tests or other studies may be needed to find the cause of your pain. Your health care provider may have you take a test called an ambulatory electrocardiogram (ECG). An ECG records your heartbeat patterns over a 24-hour period. You may also have other tests, such as:  Transthoracic echocardiogram (TTE). During echocardiography, sound waves are used to evaluate how blood flows through your heart.  Transesophageal echocardiogram (TEE).  Cardiac monitoring. This allows your health care provider to monitor your heart rate and rhythm in real time.  Holter monitor. This is a portable device that records your heartbeat and can help diagnose heart arrhythmias. It allows your health care provider to  track your heart activity for several days, if needed.  Stress tests by exercise or by giving medicine that makes the heart beat faster. TREATMENT   Treatment depends on what may be causing your chest pain. Treatment may include:  Acid blockers for heartburn.  Anti-inflammatory medicine.  Pain medicine for inflammatory conditions.  Antibiotics if an infection is present.  You may be advised to change lifestyle habits. This includes stopping smoking and avoiding alcohol, caffeine, and chocolate.  You may be advised to keep your head raised (elevated) when sleeping. This reduces the chance of acid going backward from your stomach into your esophagus. Most of the time, nonspecific chest pain will improve within 2-3 days with rest and mild pain medicine.  HOME CARE INSTRUCTIONS   If antibiotics were prescribed, take them as directed. Finish them even if you start to feel better.  For the next few days, avoid physical activities that bring on chest pain. Continue physical activities as directed.  Do not use any tobacco products, including cigarettes, chewing tobacco, or electronic cigarettes.  Avoid drinking alcohol.  Only take medicine as directed by your health care provider.  Follow your health care provider's suggestions for further testing if your chest pain does not go away.  Keep any follow-up appointments you made. If you do not go to an appointment, you could develop lasting (chronic) problems with pain. If there is any problem keeping an appointment, call to reschedule. SEEK MEDICAL CARE IF:   Your chest pain does not go away, even after treatment.  You have a rash with blisters on your  chest.  You have a fever. SEEK IMMEDIATE MEDICAL CARE IF:   You have increased chest pain or pain that spreads to your arm, neck, jaw, back, or abdomen.  You have shortness of breath.  You have an increasing cough, or you cough up blood.  You have severe back or abdominal  pain.  You feel nauseous or vomit.  You have severe weakness.  You faint.  You have chills. This is an emergency. Do not wait to see if the pain will go away. Get medical help at once. Call your local emergency services (911 in [place] Do not drive yourself to the hospital. MAKE SURE YOU:   Understand these instructions.  Will watch your condition.  Will get help right away if you are not doing well or get worse. Document Released: 06/20/2005 Document Revised: 09/15/2013 Document Reviewed: 04/15/2008 [facility] Patient Information 2015 [place], [place]. This information is not intended to replace advice given to you by your health care provider. Make sure you discuss any questions you have with your health care provider.

## 2015-01-26 NOTE — ED Notes (Signed)
Blood pressures  Left- 164/94 Right- 129/85

## 2015-01-26 NOTE — ED Notes (Addendum)
While mowing grass Monday, patient had sharp midsternal chest pain with diaphoresis, SOB.  Went inside and rested and it eased somewhat.  Since then has continued to feel "foggy-headed", weak and have mid-abdominal stomach pain and nausea w/diaphoresis.  No treatment for HTN, hx of hypercholesterolemia and colitis in past.  Has had tick bites in last 2 weeks, but denies rash, fevers.

## 2015-01-26 NOTE — ED Provider Notes (Signed)
CSN: [id number]     Arrival date & time 01/26/15  1107 History  This chart was scribed for Ezequiel Essex, MD by Erling Conte, ED Scribe. This patient was seen in room APA05/APA05 and the patient's care was started at 11:44 AM.   Chief Complaint  Patient presents with  . Weakness    The history is provided by the patient. No language interpreter was used.    HPI Comments: Stephen Porter is a 59 y.o. male with a h/p hyperlipidemia, DDD, anxetiry and depression who presents to the Emergency Department complaining of weakness that began 2 days ago. Pt states that his symptoms started while mowing the lawn 2 days ago, he notes he began having midsternal sharp chest pain with associated back pain, localized between his shoulder blades, diaphoresis and SOB. He states he went inside and began to rest which provided mild relief for the chest pain. Pt reports that over the past two days he has had gradually worsening weakness, feeling "foggy headed", mid abdominal pain, nausea and diaphoresis. He states he has not had any chest pain or back pain since the initial episode two days. He denies any h/o MI, cardiac stents or other cardiac issues. Pt is a current smoker. Pt has a h/o stomach ulcers diagnosed by endoscopy but denies any h/o abdominal surgeries. He denies any emesis, fevers, hematemesis.   Past Medical History  Diagnosis Date  . Chest pain   . Tobacco abuse   . Hyperlipidemia   . Anxiety and depression   . Insomnia   . DDD (degenerative disc disease)   . Jejunal ulcer   . Depression   . Skin rash    Past Surgical History  Procedure Laterality Date  . Lumbar spine surgery    . Cervical spine surgery      x3  . Cholecystectomy    . Colonoscopy  2006  . Back surgery    . Colonoscopy N/A 06/30/2014    Procedure: COLONOSCOPY;  Surgeon: Rogene Houston, MD;  Location: [facility];  Service: Endoscopy;  Laterality: N/A;  830  . Upper gastrointestinal endoscopy     Family History   Problem Relation Age of Onset  . Hypertension Sister   . Anxiety disorder Sister   . Hypertension Brother   . Hypertension Brother   . Hypertension Brother   . Dementia Mother   . Paranoid behavior Mother   . Bipolar disorder Father   . Dementia Father   . Alcohol abuse Paternal Uncle   . ADD / ADHD Neg Hx   . Depression Neg Hx   . OCD Neg Hx   . Schizophrenia Neg Hx   . Seizures Neg Hx   . Sexual abuse Neg Hx   . Physical abuse Neg Hx   . Colon cancer Neg Hx   . Drug abuse Paternal Uncle    History  Substance Use Topics  . Smoking status: Current Every Day Smoker -- 1.00 packs/day for 34 years    Types: Cigarettes    Last Attempt to Quit: 02/23/2012  . Smokeless tobacco: Not on file  . Alcohol Use: No    Review of Systems A complete 10 system review of systems was obtained and all systems are negative except as noted in the HPI and PMH.     Allergies  Elavil  Home Medications   Prior to Admission medications   Medication Sig Start Date End Date Taking? Authorizing Provider  ALPRAZolam Duanne Moron) 1 MG tablet Take  1 mg by mouth Twice daily. 07/30/12  Yes Historical Provider, MD  CYMBALTA 60 MG capsule Take 60 mg by mouth daily.  08/10/12  Yes Historical Provider, MD  fentaNYL (DURAGESIC - DOSED MCG/HR) 50 MCG/HR Apply 50 mcg/hr topically every 3 (three) days. 08/03/12  Yes Historical Provider, MD  ibuprofen (ADVIL,MOTRIN) 200 MG tablet Take 400-600 mg by mouth daily as needed for headache or moderate pain.   Yes Historical Provider, MD  lamoTRIgine (LAMICTAL) 100 MG tablet Take by mouth 1/2 in AM and 1 in the evening ongoing. 10/15/12  Yes Darrol Jump, MD  amLODipine (NORVASC) 5 MG tablet Take 1 tablet (5 mg total) by mouth daily. 01/26/15   Ezequiel Essex, MD  aspirin 81 MG chewable tablet Chew 1 tablet (81 mg total) by mouth daily. 01/26/15   Ezequiel Essex, MD  lidocaine (LIDODERM) 5 % Place 1 patch onto the skin daily. Remove & Discard patch within 12 hours or as  directed by MD Patient not taking: Reported on 01/26/2015 10/15/12   Darrol Jump, MD   Triage Vitals: BP 176/105 mmHg  Pulse 78  Temp(Src) 98.9 F (37.2 C) (Oral)  Resp 13  Ht 5\' 10"  (1.778 m)  Wt 215 lb (97.523 kg)  BMI 30.85 kg/m2  SpO2 97%  Physical Exam  Constitutional: He is oriented to person, place, and time. He appears well-developed and well-nourished. No distress.  Appears uncomfortable  HENT:  Head: Normocephalic and atraumatic.  Mouth/Throat: Oropharynx is clear and moist. No oropharyngeal exudate.  Eyes: Conjunctivae and EOM are normal. Pupils are equal, round, and reactive to light.  Neck: Normal range of motion. Neck supple.  No meningismus.  Cardiovascular: Normal rate, regular rhythm, normal heart sounds and intact distal pulses.   No murmur heard. Intact DP, PT and femoral pulses  Pulmonary/Chest: Effort normal and breath sounds normal. No respiratory distress.  Abdominal: Soft. There is tenderness (mild diffuse). There is no rebound and no guarding.  Musculoskeletal: Normal range of motion. He exhibits no edema or tenderness.  Neurological: He is alert and oriented to person, place, and time. No cranial nerve deficit. He exhibits normal muscle tone. Coordination normal.  No ataxia on finger to nose bilaterally. No pronator drift. 5/5 strength throughout. CN 2-12 intact. Negative Romberg. Equal grip strength. Sensation intact. Gait is normal.   Skin: Skin is warm.  Psychiatric: He has a normal mood and affect. His behavior is normal.  Nursing note and vitals reviewed.   ED Course  Procedures (including critical care time)  DIAGNOSTIC STUDIES: Oxygen Saturation is 97% on RA, normal by my interpretation.    COORDINATION OF CARE:    Labs Review Labs Reviewed  COMPREHENSIVE METABOLIC PANEL - Abnormal; Notable for the following:    Glucose, Bld 111 (*)    All other components within normal limits  CBC WITH DIFFERENTIAL/PLATELET  LIPASE, BLOOD   URINALYSIS, ROUTINE W REFLEX MICROSCOPIC  TROPONIN I  BRAIN NATRIURETIC PEPTIDE  TROPONIN I  I-STAT TROPOININ, ED    Imaging Review Ct Abdomen Pelvis W Contrast  01/26/2015   CLINICAL DATA:  59 year old male with mid abdominal pain nausea and diaphoresis. Initial encounter.  EXAM: CT ABDOMEN AND PELVIS WITH CONTRAST  TECHNIQUE: Multidetector CT imaging of the abdomen and pelvis was performed using the standard protocol following bolus administration of intravenous contrast.  CONTRAST:  146mL OMNIPAQUE IOHEXOL 300 MG/ML SOLN, 49mL OMNIPAQUE IOHEXOL 300 MG/ML SOLN  COMPARISON:  Acute abdominal series from today. CT Abdomen and Pelvis 12/20/2007.  FINDINGS: Negative lung bases, stable minor left lower lobe dependent atelectasis. No pericardial or pleural effusion.  Chronic interbody implant and pars fractures at the lumbosacral junction. No acute osseous abnormality identified.  No pelvic free fluid. Unremarkable bladder. Negative distal colon. Negative left colon. Negative, partially decompressed transverse colon. Negative right colon and appendix. Negative terminal ileum. No dilated small bowel. Oral contrast only in the stomach and proximal small bowel. Negative stomach and duodenum. No inflamed small bowel loops identified.  The gallbladder is surgically absent. There is decreased density in the liver in keeping with hepatic steatosis, progressed since 2009. Spleen, pancreas and adrenal glands are within normal limits. The portal venous system is patent. Aortoiliac calcified atherosclerosis noted. Major arterial structures are patent. Nonobstructing left upper pole nephrolithiasis measuring 3-4 mm. No hydronephrosis or perinephric stranding. Renal enhancement and contrast excretion is normal bilaterally. No abdominal free fluid or lymphadenopathy.  IMPRESSION: 1. No acute or inflammatory process in the abdomen or pelvis. Normal appendix. 2. Progressed hepatic steatosis since 2009.   Electronically Signed    By: Genevie Ann M.D.   On: 01/26/2015 14:29   Dg Abd Acute W/chest  01/26/2015   CLINICAL DATA:  Weakness, nausea, chest abdominal tightness since 01/24/2015. Personal history of jejunal ulcer. Smoker.  EXAM: DG ABDOMEN ACUTE W/ 1V CHEST  COMPARISON:  08/12/2011.  FINDINGS: The cardiomediastinal silhouette is within normal limits. The lungs are well inflated and clear. No pleural effusion or pneumothorax is seen. Prior lower cervical fusion is noted.  Right upper quadrant abdominal surgical clips are noted. There is no evidence of intraperitoneal free air. No significant bowel air-fluid levels are seen. Gas and a small amount of stool are present throughout nondilated colon. Gas is also present in nondilated loops of small bowel without evidence of obstruction. Calcifications in the pelvis likely represent phleboliths. Prior L5-S1 interbody fusion is noted.  IMPRESSION: 1. No evidence of acute cardiopulmonary disease. 2. Nonobstructed bowel-gas pattern.   Electronically Signed   By: Logan Bores   On: 01/26/2015 12:44   Ct Angio Chest Aorta W/cm &/or Wo/cm  01/26/2015   CLINICAL DATA:  Onset midsternal chest pain, back pain, diaphoresis and shortness of breath over the past 2 days. Question aortic dissection.  EXAM: CT ANGIOGRAPHY CHEST WITH CONTRAST  TECHNIQUE: Multidetector CT imaging of the chest was performed using the standard protocol during bolus administration of intravenous contrast. Multiplanar CT image reconstructions and MIPs were obtained to evaluate the vascular anatomy.  CONTRAST:  100 mL OMNIPAQUE IOHEXOL 350 MG/ML SOLN  COMPARISON:  Plain films chest and abdomen earlier this same day.  FINDINGS: There is no aortic dissection or aneurysm. Heart size is normal. No pleural or pericardial effusion. No calcific coronary artery disease identified. Although not specifically designed to evaluate for pulmonary embolus, no pulmonary embolus is seen. The patient has a normal three-vessel arch configuration.  No supraclavicular, axillary, hilar or mediastinal lymphadenopathy is identified. The lungs demonstrate centrilobular emphysematous disease. The lungs are otherwise unremarkable.  For findings regarding the abdomen, please see report of CT abdomen and pelvis this same day. No lytic or sclerotic bony lesion is identified.  Review of the MIP images confirms the above findings.  IMPRESSION: No acute abnormality.  Negative for aortic dissection or aneurysm.  Emphysema.   Electronically Signed   By: Inge Rise M.D.   On: 01/26/2015 16:47     EKG Interpretation   Date/Time:  Wednesday Jan 26 2015 15:41:40 EDT Ventricular Rate:  75 PR  Interval:  164 QRS Duration: 86 QT Interval:  397 QTC Calculation: 443 R Axis:   -47 Text Interpretation:  Sinus rhythm Left axis deviation Low voltage,  precordial leads No significant change was found Confirmed by Wyvonnia Dusky  MD,  Zamya Culhane [phone]) on 01/26/2015 3:45:36 PM      MDM   Final diagnoses:  Chest pain, unspecified chest pain type  Essential hypertension  Chest pain  episode of chest and upper back pain 2 days ago, now resolved but ongoing nausea, abdominal pain, and weakness.    EKG unchanged. CXR negative. Equal radial pulses and grip strengths. Labs wnl. Chest pain with diaphoresis on exertion is concerning but no further episodes. Troponin negative x.2  Heart score is 3. Patient prefers not to be admitted. He's had no further chest pain since Monday.  D/w Dr. Bronson Ing.  He agrees the patient should have troponin elevation by now given his chest pain was 3 days ago. EKG is normal sinus rhythm. Troponin is negative 2. Blood pressure remains elevated with no history of hypertension. Dr. Bronson Ing call patient this week to arrange outpatient visit and possible stress test. He recommends starting daily aspirin as well as amlodipine for hypertension.  Imaging negative for dissection or other pathology.  Risks of second dose of contrast  discussed with patient and he was agreeable.  I personally performed the services described in this documentation, which was scribed in my presence. The recorded information has been reviewed and is accurate.   Ezequiel Essex, MD 01/26/15 [phone]

## 2015-01-26 NOTE — ED Notes (Signed)
Blood pressures: Left: 149/92 Right: 155/90

## 2015-02-10 ENCOUNTER — Encounter: Payer: Self-pay | Admitting: Cardiology

## 2015-02-10 NOTE — Progress Notes (Signed)
No show  This encounter was created in error - please disregard.

## 2015-03-29 ENCOUNTER — Emergency Department (HOSPITAL_COMMUNITY): Payer: Medicare Other

## 2015-03-29 ENCOUNTER — Emergency Department (HOSPITAL_COMMUNITY)
Admission: EM | Admit: 2015-03-29 | Discharge: 2015-03-29 | Disposition: A | Discharge status: Home / Self Care | Payer: Medicare Other | Attending: Emergency Medicine | Admitting: Emergency Medicine

## 2015-03-29 ENCOUNTER — Encounter (HOSPITAL_COMMUNITY): Payer: Self-pay | Admitting: Emergency Medicine

## 2015-03-29 DIAGNOSIS — Z8639 Personal history of other endocrine, nutritional and metabolic disease: Secondary | ICD-10-CM | POA: Diagnosis not present

## 2015-03-29 DIAGNOSIS — Z720 Tobacco use: Secondary | ICD-10-CM | POA: Insufficient documentation

## 2015-03-29 DIAGNOSIS — Z8669 Personal history of other diseases of the nervous system and sense organs: Secondary | ICD-10-CM | POA: Insufficient documentation

## 2015-03-29 DIAGNOSIS — Z8739 Personal history of other diseases of the musculoskeletal system and connective tissue: Secondary | ICD-10-CM

## 2015-03-29 DIAGNOSIS — Z872 Personal history of diseases of the skin and subcutaneous tissue: Secondary | ICD-10-CM | POA: Diagnosis not present

## 2015-03-29 DIAGNOSIS — R531 Weakness: Secondary | ICD-10-CM | POA: Diagnosis not present

## 2015-03-29 DIAGNOSIS — Z9889 Other specified postprocedural states: Secondary | ICD-10-CM | POA: Diagnosis not present

## 2015-03-29 DIAGNOSIS — F319 Bipolar disorder, unspecified: Secondary | ICD-10-CM | POA: Diagnosis not present

## 2015-03-29 DIAGNOSIS — Z7982 Long term (current) use of aspirin: Secondary | ICD-10-CM | POA: Diagnosis not present

## 2015-03-29 DIAGNOSIS — M5412 Radiculopathy, cervical region: Secondary | ICD-10-CM | POA: Diagnosis not present

## 2015-03-29 DIAGNOSIS — M542 Cervicalgia: Secondary | ICD-10-CM | POA: Diagnosis present

## 2015-03-29 DIAGNOSIS — Z79899 Other long term (current) drug therapy: Secondary | ICD-10-CM | POA: Insufficient documentation

## 2015-03-29 LAB — BASIC METABOLIC PANEL
ANION GAP: 8 (ref 5–15)
BUN: 9 mg/dL (ref 6–20)
CHLORIDE: 107 mmol/L (ref 101–111)
CO2: 22 mmol/L (ref 22–32)
Calcium: 9 mg/dL (ref 8.9–10.3)
Creatinine, Ser: 0.78 mg/dL (ref 0.61–1.24)
GFR calc Af Amer: >60 mL/min (ref >60)
GFR calc non Af Amer: >60 mL/min (ref >60)
GLUCOSE: 127 mg/dL — AB (ref 65–99)
Potassium: 3.6 mmol/L (ref 3.5–5.1)
SODIUM: 137 mmol/L (ref 135–145)

## 2015-03-29 LAB — CBC WITH DIFFERENTIAL/PLATELET
Basophils Absolute: 0 10*3/uL (ref 0.0–0.1)
Basophils Relative: 0 % (ref 0–1)
EOS ABS: 0.4 10*3/uL (ref 0.0–0.7)
EOS PCT: 4 % (ref 0–5)
HEMATOCRIT: 45.8 % (ref 39.0–52.0)
HEMOGLOBIN: 15.6 g/dL (ref 13.0–17.0)
LYMPHS ABS: 2 10*3/uL (ref 0.7–4.0)
LYMPHS PCT: 20 % (ref 12–46)
MCH: 30 pg (ref 26.0–34.0)
MCHC: 34.1 g/dL (ref 30.0–36.0)
MCV: 88.1 fL (ref 78.0–100.0)
MONOS PCT: 6 % (ref 3–12)
Monocytes Absolute: 0.6 10*3/uL (ref 0.1–1.0)
Neutro Abs: 6.8 10*3/uL (ref 1.7–7.7)
Neutrophils Relative %: 70 % (ref 43–77)
Platelets: 247 10*3/uL (ref 150–400)
RBC: 5.2 MIL/uL (ref 4.22–5.81)
RDW: 13.9 % (ref 11.5–15.5)
WBC: 9.8 10*3/uL (ref 4.0–10.5)

## 2015-03-29 MED ORDER — SODIUM CHLORIDE 0.9 % IV SOLN
Freq: Once | INTRAVENOUS | Status: AC
  Administered 2015-03-29: 12:00:00 via INTRAVENOUS

## 2015-03-29 MED ORDER — KETOROLAC TROMETHAMINE 30 MG/ML IJ SOLN
30.0000 mg | Freq: Once | INTRAMUSCULAR | Status: AC
  Administered 2015-03-29: 30 mg via INTRAVENOUS
  Filled 2015-03-29: qty 1

## 2015-03-29 MED ORDER — ONDANSETRON HCL 4 MG/2ML IJ SOLN: 4.0000 mg | Freq: Once | INTRAMUSCULAR | Status: DC

## 2015-03-29 MED ORDER — LORAZEPAM 2 MG/ML IJ SOLN
1.0000 mg | Freq: Once | INTRAMUSCULAR | Status: AC
  Administered 2015-03-29: 1 mg via INTRAVENOUS

## 2015-03-29 MED ORDER — PREDNISONE 20 MG PO TABS: ORAL_TABLET | ORAL | Status: DC

## 2015-03-29 MED ORDER — DEXAMETHASONE SODIUM PHOSPHATE 10 MG/ML IJ SOLN
10.0000 mg | Freq: Once | INTRAMUSCULAR | Status: AC
  Administered 2015-03-29: 10 mg via INTRAVENOUS
  Filled 2015-03-29: qty 1

## 2015-03-29 MED ORDER — LORAZEPAM 2 MG/ML IJ SOLN
INTRAMUSCULAR | Status: AC
  Filled 2015-03-29: qty 1

## 2015-03-29 MED ORDER — ONDANSETRON HCL 4 MG/2ML IJ SOLN
4.0000 mg | Freq: Once | INTRAMUSCULAR | Status: AC
  Administered 2015-03-29: 4 mg via INTRAVENOUS
  Filled 2015-03-29: qty 2

## 2015-03-29 MED ORDER — OXYCODONE-ACETAMINOPHEN 5-325 MG PO TABS: 1.0000 | ORAL_TABLET | ORAL | Status: DC | PRN

## 2015-03-29 MED ORDER — DIAZEPAM 5 MG PO TABS: 5.0000 mg | ORAL_TABLET | Freq: Four times a day (QID) | ORAL | Status: DC | PRN

## 2015-03-29 MED ORDER — HYDROMORPHONE HCL 2 MG/ML IJ SOLN
2.0000 mg | Freq: Once | INTRAMUSCULAR | Status: AC
  Administered 2015-03-29: 2 mg via INTRAVENOUS
  Filled 2015-03-29: qty 1

## 2015-03-29 NOTE — Discharge Instructions (Signed)

## 2015-03-29 NOTE — ED Provider Notes (Signed)
History  This chart was scribed for Orpah Greek, MD by Marlowe Kays, ED Scribe. This patient was seen in room [facility]19/[facility]19 and the patient's care was started at 11:45 AM.  Chief Complaint  Patient presents with  . Neck Pain   The history is provided by the patient and medical records. No language interpreter was used.    HPI Comments:  Stephen Porter is a 59 y.o. male who presents to the Emergency Department complaining of severe posterior neck pain that began yesterday. He reports h/o 3 different neck surgeries. He reports associated numbness and tingling of BUE. He reports weakness of the right arm stating he frequently drops things. He has not done anything to treat his pain. He denies modifying factors. He denies fever, chills, nausea, vomiting, bruising or wounds. He denies any trauma, injury or fall.   Past Medical History  Diagnosis Date  . Hyperlipidemia   . Bipolar disorder   . Insomnia   . DDD (degenerative disc disease)   . Jejunal ulcer   . Depression   . Skin rash    Past Surgical History  Procedure Laterality Date  . Lumbar spine surgery    . Cervical spine surgery      x3  . Cholecystectomy    . Colonoscopy  2006  . Back surgery    . Colonoscopy N/A 06/30/2014    Procedure: COLONOSCOPY;  Surgeon: Rogene Houston, MD;  Location: [facility];  Service: Endoscopy;  Laterality: N/A;  830  . Upper gastrointestinal endoscopy     Family History  Problem Relation Age of Onset  . Hypertension Sister   . Anxiety disorder Sister   . Hypertension Brother   . Hypertension Brother   . Hypertension Brother   . Dementia Mother   . Paranoid behavior Mother   . Bipolar disorder Father   . Dementia Father   . Alcohol abuse Paternal Uncle   . ADD / ADHD Neg Hx   . Depression Neg Hx   . OCD Neg Hx   . Schizophrenia Neg Hx   . Seizures Neg Hx   . Sexual abuse Neg Hx   . Physical abuse Neg Hx   . Colon cancer Neg Hx   . Drug abuse Paternal Uncle     History  Substance Use Topics  . Smoking status: Current Every Day Smoker -- 1.00 packs/day for 34 years    Types: Cigarettes    Last Attempt to Quit: 02/23/2012  . Smokeless tobacco: Not on file  . Alcohol Use: No    Review of Systems  Constitutional: Negative for fever and chills.  Gastrointestinal: Negative for nausea and vomiting.  Musculoskeletal: Positive for neck pain.  Skin: Negative for color change and wound.  Neurological: Positive for weakness and numbness.  All other systems reviewed and are negative.   Allergies  Elavil  Home Medications   Prior to Admission medications   Medication Sig Start Date End Date Taking? Authorizing Provider  ALPRAZolam Duanne Moron) 0.5 MG tablet Take 0.5 mg by mouth 3 (three) times daily.   Yes Historical Provider, MD  amLODipine (NORVASC) 5 MG tablet Take 1 tablet (5 mg total) by mouth daily. 01/26/15  Yes Ezequiel Essex, MD  aspirin 81 MG chewable tablet Chew 1 tablet (81 mg total) by mouth daily. 01/26/15  Yes Ezequiel Essex, MD  CYMBALTA 60 MG capsule Take 60 mg by mouth daily.  08/10/12  Yes Historical Provider, MD  fentaNYL (DURAGESIC - DOSED MCG/HR) 50  MCG/HR Apply 50 mcg/hr topically every 3 (three) days. 08/03/12  Yes Historical Provider, MD  ibuprofen (ADVIL,MOTRIN) 200 MG tablet Take 400-600 mg by mouth daily as needed for headache or moderate pain.   Yes Historical Provider, MD  lamoTRIgine (LAMICTAL) 100 MG tablet Take by mouth 1/2 in AM and 1 in the evening ongoing. Patient taking differently: Take 50-100 mg by mouth 2 (two) times daily. Take by mouth 1/2 in AM and 1 in the evening ongoing. 10/15/12  Yes Darrol Jump, MD  Multiple Vitamins-Minerals (MULTIVITAMINS THER. W/MINERALS) TABS tablet Take 1 tablet by mouth daily.   Yes Historical Provider, MD   Triage Vitals: BP 138/98 mmHg  Pulse 76  Temp(Src) 98.3 F (36.8 C) (Oral)  Resp 20  Ht 5\' 10"  (1.778 m)  Wt 215 lb (97.523 kg)  BMI 30.85 kg/m2  SpO2 97% Physical Exam   Constitutional: He is oriented to person, place, and time. He appears well-developed and well-nourished. No distress.  HENT:  Head: Normocephalic and atraumatic.  Right Ear: Hearing normal.  Left Ear: Hearing normal.  Nose: Nose normal.  Mouth/Throat: Oropharynx is clear and moist and mucous membranes are normal.  Eyes: Conjunctivae and EOM are normal. Pupils are equal, round, and reactive to light.  Neck: Normal range of motion. Neck supple.  Diffused neck pain.  Cardiovascular: Regular rhythm, S1 normal and S2 normal.  Exam reveals no gallop and no friction rub.   No murmur heard. Pulmonary/Chest: Effort normal and breath sounds normal. No respiratory distress. He exhibits no tenderness.  Abdominal: Soft. Normal appearance and bowel sounds are normal. There is no hepatosplenomegaly. There is no tenderness. There is no rebound, no guarding, no tenderness at McBurney's point and negative Murphy's sign. No hernia.  Musculoskeletal: Normal range of motion.  Neurological: He is alert and oriented to person, place, and time. He has normal strength. No cranial nerve deficit or sensory deficit. Coordination normal. GCS eye subscore is 4. GCS verbal subscore is 5. GCS motor subscore is 6.  Decreased grip strength Decreased flexion and extension Decreased raising of shoulders  Skin: Skin is warm, dry and intact. No rash noted. No cyanosis.  Psychiatric: He has a normal mood and affect. His speech is normal and behavior is normal. Thought content normal.    ED Course  Procedures (including critical care time) DIAGNOSTIC STUDIES: Oxygen Saturation is 97% on RA, normal by my interpretation.   COORDINATION OF CARE: 11:49 AM- Pt verbalizes understanding and agrees to plan.  Medications  0.9 %  sodium chloride infusion ( Intravenous New Bag/Given 03/29/15 1224)  HYDROmorphone (DILAUDID) injection 2 mg (2 mg Intravenous Given 03/29/15 1203)  ondansetron (ZOFRAN) injection 4 mg (4 mg Intravenous  Given 03/29/15 1203)  dexamethasone (DECADRON) injection 10 mg (10 mg Intravenous Given 03/29/15 1203)  LORazepam (ATIVAN) injection 1 mg (1 mg Intravenous Given 03/29/15 1316)  ketorolac (TORADOL) 30 MG/ML injection 30 mg (30 mg Intravenous Given 03/29/15 1445)    Labs Review Labs Reviewed  BASIC METABOLIC PANEL - Abnormal; Notable for the following:    Glucose, Bld 127 (*)    All other components within normal limits  CBC WITH DIFFERENTIAL/PLATELET    Imaging Review Mr Cervical Spine Wo Contrast  03/29/2015   CLINICAL DATA:  Cervical radiculopathy. Chronic cervicalgia/neck pain. Two days of severe cervicalgia/neck pain with RIGHT arm weakness. No injury. 3 prior neck operations.  EXAM: MRI CERVICAL SPINE WITHOUT CONTRAST  TECHNIQUE: Multiplanar, multisequence MR imaging of the cervical spine was performed.  No intravenous contrast was administered.  COMPARISON:  10/08/2011.  Chest CT 01/26/2015.  FINDINGS: Exam mildly degraded by motion artifact.  Alignment: Reversal of the normal cervical lordosis centered around C4. 2 mm retrolisthesis of C4 on C5 associated with collapse of the disc.  Vertebrae: No aggressive osseous lesions. C5-C6 ACDF. C6-C7 solid fusion. Negative for fracture.  Cord: No intramedullary lesions or cord edema. Cervical cord myelomalacia is present dorsal to C7, compatible with old cord compression.  Posterior Fossa: Grossly normal.  Vertebral Arteries: Flow voids present in both vertebral arteries. LEFT dominant vertebral system.  Paraspinal tissues: Cystic lesion in the LEFT thyroid lobe measuring 14 mm. No further evaluation is warranted in this age group. This follows ACR consensus guidelines: Managing Incidental Thyroid Nodules Detected on Imaging: White Paper of the ACR Incidental Thyroid Findings Committee. J Am Coll Radiol 2015; 12:143-150.  Disc Signal: Disc desiccation and degeneration most pronounced at C3-C4 and C4-C5.  Disc levels:  C2-C3:  Negative.  C3-C4: Central annular  fissure and broad-based disc osteophyte complex eccentric to the RIGHT. There is bilateral foraminal stenosis due to uncovertebral spurring and facet arthrosis. Foraminal stenosis is greater on the RIGHT when compared to the LEFT and potentially affects both C4 nerves.  C4-C5: Broad-based posterior disc osteophyte complex. Central disc extrusion with mild caudal migration of disc material appears similar to the prior exam of 2013. This produces moderate central stenosis in conjunction with posterior ligamentum flavum redundancy and the retrolisthesis. AP diameter the spinal canal is 8 mm. There is flattening of the LEFT ventral cord. No cord edema. Left-greater-than-right bilateral foraminal stenosis associated with uncovertebral spurring and retrolisthesis.  C5-C6: ACDF. No recurrent stenosis. No definite bridging bone is present across the disc space.  C6-C7: Solid fusion.  No complicating features.  C7-T1:  Negative.  IMPRESSION: 1. Uncomplicated postoperative changes from C5 through C7. Solid fusion at C6-C7. ACDF plate remains at Y6-R4. 2. Little change in the C4-C5 moderate central stenosis and bilateral foraminal stenosis. Unchanged 2 mm of retrolisthesis at unchanged 2 mm degenerative retrolisthesis with bilateral foraminal stenosis. 3. Mild progression of RIGHT eccentric disc osteophyte complex at C3-C4 with right-greater-than-left bilateral foraminal stenosis potentially affecting the C4 nerves.   Electronically Signed   By: Dereck Ligas M.D.   On: 03/29/2015 14:26     EKG Interpretation None      MDM   Final diagnoses:  History of neck pain    Patient presents to the ER for evaluation of neck pain. Patient does have a history of cervical fusion. He has been experiencing progressively worsening neck pain, but now is experiencing severe pain. He has noticed that the pain radiates to both arms, both arms are weak, but he has been noticing that he is dropping and having trouble gripping  things with his right hand. Examination revealed diffuse weakness of both upper extremities. He has normal sensation. MRI was performed. He has uncompensated changes from previous surgery. No change in central stenosis and bilateral foraminal stenosis at C4-C5. He has had mild progression of right eccentric disc osteophyte at C3-C4 there is pressing on C4 nerves, likely causing the pain in the right arm. This does not require emergent intervention. We'll continue Percocet, Valium, prednisone, follow-up with Dr. Carloyn Manner his neurosurgeon.  I personally performed the services described in this documentation, which was scribed in my presence. The recorded information has been reviewed and is accurate.    Orpah Greek, MD 03/29/15 [phone]

## 2015-03-29 NOTE — ED Notes (Signed)
Pt made aware to return if symptoms worsen or if any life threatening symptoms occur.   

## 2015-03-29 NOTE — ED Notes (Signed)
Patient complaining of neck pain and inability to grip objects with right hand.

## 2015-08-13 ENCOUNTER — Encounter (HOSPITAL_COMMUNITY): Payer: Self-pay

## 2015-08-13 ENCOUNTER — Emergency Department (HOSPITAL_COMMUNITY): Payer: Medicare Other

## 2015-08-13 ENCOUNTER — Emergency Department (HOSPITAL_COMMUNITY)
Admission: EM | Admit: 2015-08-13 | Discharge: 2015-08-13 | Disposition: A | Discharge status: Home / Self Care | Payer: Medicare Other | Attending: Emergency Medicine | Admitting: Emergency Medicine

## 2015-08-13 DIAGNOSIS — M545 Low back pain, unspecified: Secondary | ICD-10-CM

## 2015-08-13 DIAGNOSIS — F17210 Nicotine dependence, cigarettes, uncomplicated: Secondary | ICD-10-CM | POA: Diagnosis not present

## 2015-08-13 DIAGNOSIS — Z79899 Other long term (current) drug therapy: Secondary | ICD-10-CM | POA: Insufficient documentation

## 2015-08-13 DIAGNOSIS — Z7982 Long term (current) use of aspirin: Secondary | ICD-10-CM | POA: Diagnosis not present

## 2015-08-13 DIAGNOSIS — Z8639 Personal history of other endocrine, nutritional and metabolic disease: Secondary | ICD-10-CM | POA: Insufficient documentation

## 2015-08-13 DIAGNOSIS — Z9889 Other specified postprocedural states: Secondary | ICD-10-CM | POA: Insufficient documentation

## 2015-08-13 DIAGNOSIS — F329 Major depressive disorder, single episode, unspecified: Secondary | ICD-10-CM | POA: Diagnosis not present

## 2015-08-13 DIAGNOSIS — Z8669 Personal history of other diseases of the nervous system and sense organs: Secondary | ICD-10-CM | POA: Insufficient documentation

## 2015-08-13 DIAGNOSIS — R52 Pain, unspecified: Secondary | ICD-10-CM

## 2015-08-13 LAB — CBC WITH DIFFERENTIAL/PLATELET
BASOS ABS: 0 10*3/uL (ref 0.0–0.1)
Basophils Relative: 0 %
Eosinophils Absolute: 0.3 10*3/uL (ref 0.0–0.7)
Eosinophils Relative: 3 %
HEMATOCRIT: 42.9 % (ref 39.0–52.0)
Hemoglobin: 13.9 g/dL (ref 13.0–17.0)
Lymphocytes Relative: 23 %
Lymphs Abs: 3 10*3/uL (ref 0.7–4.0)
MCH: 29 pg (ref 26.0–34.0)
MCHC: 32.4 g/dL (ref 30.0–36.0)
MCV: 89.6 fL (ref 78.0–100.0)
Monocytes Absolute: 1 10*3/uL (ref 0.1–1.0)
Monocytes Relative: 7 %
NEUTROS ABS: 9 10*3/uL — AB (ref 1.7–7.7)
NEUTROS PCT: 67 %
Platelets: 238 10*3/uL (ref 150–400)
RBC: 4.79 MIL/uL (ref 4.22–5.81)
RDW: 13.5 % (ref 11.5–15.5)
WBC: 13.3 10*3/uL — ABNORMAL HIGH (ref 4.0–10.5)

## 2015-08-13 LAB — COMPREHENSIVE METABOLIC PANEL
ALBUMIN: 3.8 g/dL (ref 3.5–5.0)
ALT: 39 U/L (ref 17–63)
AST: 26 U/L (ref 15–41)
Alkaline Phosphatase: 70 U/L (ref 38–126)
Anion gap: 8 (ref 5–15)
BILIRUBIN TOTAL: 0.5 mg/dL (ref 0.3–1.2)
BUN: 17 mg/dL (ref 6–20)
CALCIUM: 8.8 mg/dL — AB (ref 8.9–10.3)
CO2: 25 mmol/L (ref 22–32)
Chloride: 107 mmol/L (ref 101–111)
Creatinine, Ser: 0.94 mg/dL (ref 0.61–1.24)
GFR calc Af Amer: >60 mL/min (ref >60)
GFR calc non Af Amer: >60 mL/min (ref >60)
GLUCOSE: 83 mg/dL (ref 65–99)
Potassium: 3.9 mmol/L (ref 3.5–5.1)
Sodium: 140 mmol/L (ref 135–145)
TOTAL PROTEIN: 6.6 g/dL (ref 6.5–8.1)

## 2015-08-13 LAB — URINALYSIS, ROUTINE W REFLEX MICROSCOPIC
Bilirubin Urine: NEGATIVE
GLUCOSE, UA: NEGATIVE mg/dL
Hgb urine dipstick: NEGATIVE
KETONES UR: NEGATIVE mg/dL
Leukocytes, UA: NEGATIVE
Nitrite: NEGATIVE
PH: 6.5 (ref 5.0–8.0)
PROTEIN: NEGATIVE mg/dL
Specific Gravity, Urine: 1.02 (ref 1.005–1.030)

## 2015-08-13 MED ORDER — HYDROMORPHONE HCL 1 MG/ML IJ SOLN
1.0000 mg | Freq: Once | INTRAMUSCULAR | Status: AC
  Administered 2015-08-13: 1 mg via INTRAVENOUS
  Filled 2015-08-13: qty 1

## 2015-08-13 MED ORDER — OXYCODONE-ACETAMINOPHEN 5-325 MG PO TABS: 1.0000 | ORAL_TABLET | Freq: Four times a day (QID) | ORAL | Status: DC | PRN

## 2015-08-13 MED ORDER — KETOROLAC TROMETHAMINE 30 MG/ML IJ SOLN
30.0000 mg | Freq: Once | INTRAMUSCULAR | Status: AC
  Administered 2015-08-13: 30 mg via INTRAVENOUS
  Filled 2015-08-13: qty 1

## 2015-08-13 MED ORDER — ONDANSETRON HCL 4 MG/2ML IJ SOLN
4.0000 mg | Freq: Once | INTRAMUSCULAR | Status: AC
  Administered 2015-08-13: 4 mg via INTRAVENOUS
  Filled 2015-08-13: qty 2

## 2015-08-13 MED ORDER — CYCLOBENZAPRINE HCL 10 MG PO TABS: 10.0000 mg | ORAL_TABLET | Freq: Three times a day (TID) | ORAL | Status: DC | PRN

## 2015-08-13 NOTE — Discharge Instructions (Signed)
Follow up with your md next week. °

## 2015-08-13 NOTE — ED Notes (Signed)
I think I am having a kidney stone. Having sharp pains in my right kidney.

## 2015-08-13 NOTE — ED Notes (Signed)
Pt states understanding of care given and follow up instructions.  Ambulated from ED  

## 2015-08-13 NOTE — ED Provider Notes (Signed)
CSN: [id number]     Arrival date & time 08/13/15  2034 History  By signing my name below, I, Hilda Lias, attest that this documentation has been prepared under the direction and in the presence of Milton Ferguson, MD. Electronically Signed: Hilda Lias, ED Scribe. 08/13/2015. 9:00 PM.    Chief Complaint  Patient presents with  . Flank Pain     Patient is a 59 y.o. male presenting with flank pain. The history is provided by the patient. No language interpreter was used.  Flank Pain This is a new problem. The current episode started 1 to 2 hours ago. The problem occurs constantly. The problem has not changed since onset.Pertinent negatives include no chest pain, no abdominal pain and no headaches. The symptoms are aggravated by bending and twisting. The symptoms are relieved by relaxation, position and rest. He has tried nothing for the symptoms.   HPI Comments: Stephen Porter is a 59 y.o. male who presents to the Emergency Department complaining of intermittent, recurrent, worsening right-sided lower back pain that radiates up his side towards his rib cage that has been present for a 5 days. Pt reports within the past few hours that his pain has worsened. Pt states there is an area about the size of his fist that constantly hurts, and pain worsens with movement of his trunk. Pt states he has never had kidney stones before.   Past Medical History  Diagnosis Date  . Hyperlipidemia   . Bipolar disorder ([place])   . Insomnia   . DDD (degenerative disc disease)   . Jejunal ulcer   . Depression   . Skin rash    Past Surgical History  Procedure Laterality Date  . Lumbar spine surgery    . Cervical spine surgery      x3  . Cholecystectomy    . Colonoscopy  2006  . Back surgery    . Colonoscopy N/A 06/30/2014    Procedure: COLONOSCOPY;  Surgeon: Rogene Houston, MD;  Location: [facility];  Service: Endoscopy;  Laterality: N/A;  830  . Upper gastrointestinal endoscopy     Family  History  Problem Relation Age of Onset  . Hypertension Sister   . Anxiety disorder Sister   . Hypertension Brother   . Hypertension Brother   . Hypertension Brother   . Dementia Mother   . Paranoid behavior Mother   . Bipolar disorder Father   . Dementia Father   . Alcohol abuse Paternal Uncle   . ADD / ADHD Neg Hx   . Depression Neg Hx   . OCD Neg Hx   . Schizophrenia Neg Hx   . Seizures Neg Hx   . Sexual abuse Neg Hx   . Physical abuse Neg Hx   . Colon cancer Neg Hx   . Drug abuse Paternal Uncle    Social History  Substance Use Topics  . Smoking status: Current Every Day Smoker -- 1.00 packs/day for 34 years    Types: Cigarettes    Last Attempt to Quit: 02/23/2012  . Smokeless tobacco: None  . Alcohol Use: No    Review of Systems  Constitutional: Negative for appetite change and fatigue.  HENT: Negative for congestion, ear discharge and sinus pressure.   Eyes: Negative for discharge.  Respiratory: Negative for cough.   Cardiovascular: Negative for chest pain.  Gastrointestinal: Negative for abdominal pain and diarrhea.  Genitourinary: Positive for flank pain. Negative for frequency and hematuria.  Musculoskeletal: Negative for back pain.  Skin: Negative for rash.  Neurological: Negative for seizures and headaches.  Psychiatric/Behavioral: Negative for hallucinations.      Allergies  Elavil  Home Medications   Prior to Admission medications   Medication Sig Start Date End Date Taking? Authorizing Provider  ALPRAZolam Duanne Moron) 0.5 MG tablet Take 0.5 mg by mouth 3 (three) times daily.    Historical Provider, MD  amLODipine (NORVASC) 5 MG tablet Take 1 tablet (5 mg total) by mouth daily. 01/26/15   Ezequiel Essex, MD  aspirin 81 MG chewable tablet Chew 1 tablet (81 mg total) by mouth daily. 01/26/15   Ezequiel Essex, MD  CYMBALTA 60 MG capsule Take 60 mg by mouth daily.  08/10/12   Historical Provider, MD  diazepam (VALIUM) 5 MG tablet Take 1 tablet (5 mg total)  by mouth every 6 (six) hours as needed for muscle spasms. 03/29/15   Orpah Greek, MD  fentaNYL (DURAGESIC - DOSED MCG/HR) 50 MCG/HR Apply 50 mcg/hr topically every 3 (three) days. 08/03/12   Historical Provider, MD  ibuprofen (ADVIL,MOTRIN) 200 MG tablet Take 400-600 mg by mouth daily as needed for headache or moderate pain.    Historical Provider, MD  lamoTRIgine (LAMICTAL) 100 MG tablet Take by mouth 1/2 in AM and 1 in the evening ongoing. Patient taking differently: Take 50-100 mg by mouth 2 (two) times daily. Take by mouth 1/2 in AM and 1 in the evening ongoing. 10/15/12   Darrol Jump, MD  Multiple Vitamins-Minerals (MULTIVITAMINS THER. W/MINERALS) TABS tablet Take 1 tablet by mouth daily.    Historical Provider, MD  oxyCODONE-acetaminophen (PERCOCET) 5-325 MG per tablet Take 1-2 tablets by mouth every 4 (four) hours as needed. 03/29/15   Orpah Greek, MD  predniSONE (DELTASONE) 20 MG tablet 3 tabs po daily x 3 days, then 2 tabs x 3 days, then 1.5 tabs x 3 days, then 1 tab x 3 days, then 0.5 tabs x 3 days 03/29/15   Orpah Greek, MD   BP 141/95 mmHg  Pulse 81  Temp(Src) 97.7 F (36.5 C) (Oral)  Resp 20  Ht 5\' 10"  (1.778 m)  Wt 220 lb (99.791 kg)  BMI 31.57 kg/m2  SpO2 98% Physical Exam  Constitutional: He is oriented to person, place, and time. He appears well-developed.  HENT:  Head: Normocephalic.  Eyes: Conjunctivae and EOM are normal. No scleral icterus.  Neck: Neck supple. No thyromegaly present.  Cardiovascular: Normal rate and regular rhythm.  Exam reveals no gallop and no friction rub.   No murmur heard. Pulmonary/Chest: No stridor. He has no wheezes. He has no rales. He exhibits no tenderness.  Abdominal: He exhibits no distension. There is no tenderness. There is no rebound.  Musculoskeletal: Normal range of motion. He exhibits tenderness. He exhibits no edema.  Moderate right flank tenderness  Lymphadenopathy:    He has no cervical adenopathy.   Neurological: He is oriented to person, place, and time. He exhibits normal muscle tone. Coordination normal.  Skin: No rash noted. No erythema.  Psychiatric: He has a normal mood and affect. His behavior is normal.    ED Course  Procedures (including critical care time)  DIAGNOSTIC STUDIES: Oxygen Saturation is 98% on room air, normal by my interpretation.    COORDINATION OF CARE: 8:53 PM Discussed treatment plan with pt at bedside and pt agreed to plan.   Labs Review Labs Reviewed - No data to display  Imaging Review No results found. I have personally reviewed and evaluated these  images and lab results as part of my medical decision-making.   EKG Interpretation None      MDM   Final diagnoses:  None      Labs unremarkable except for white count slightly elevated. CT scan was negative for ureteral stone. Suspect musculoskeletal pain. Patient will be given Flexeril and Percocet and will follow-up with PCP  Milton Ferguson, MD 08/13/15 2320

## 2015-10-07 DIAGNOSIS — G8929 Other chronic pain: Secondary | ICD-10-CM | POA: Diagnosis not present

## 2015-10-07 DIAGNOSIS — Z6832 Body mass index (BMI) 32.0-32.9, adult: Secondary | ICD-10-CM | POA: Diagnosis not present

## 2015-10-07 DIAGNOSIS — M542 Cervicalgia: Secondary | ICD-10-CM | POA: Diagnosis not present

## 2015-10-07 DIAGNOSIS — Z125 Encounter for screening for malignant neoplasm of prostate: Secondary | ICD-10-CM | POA: Diagnosis not present

## 2015-10-07 DIAGNOSIS — Z720 Tobacco use: Secondary | ICD-10-CM | POA: Diagnosis not present

## 2015-10-07 DIAGNOSIS — Z79899 Other long term (current) drug therapy: Secondary | ICD-10-CM | POA: Diagnosis not present

## 2015-10-07 DIAGNOSIS — R5383 Other fatigue: Secondary | ICD-10-CM | POA: Diagnosis not present

## 2015-10-07 DIAGNOSIS — E7800 Pure hypercholesterolemia, unspecified: Secondary | ICD-10-CM | POA: Diagnosis not present

## 2015-10-27 DIAGNOSIS — I10 Essential (primary) hypertension: Secondary | ICD-10-CM | POA: Diagnosis not present

## 2015-10-27 DIAGNOSIS — R52 Pain, unspecified: Secondary | ICD-10-CM | POA: Diagnosis not present

## 2015-10-27 DIAGNOSIS — M549 Dorsalgia, unspecified: Secondary | ICD-10-CM | POA: Diagnosis not present

## 2015-12-01 ENCOUNTER — Emergency Department (HOSPITAL_COMMUNITY): Payer: PPO

## 2015-12-01 ENCOUNTER — Encounter (HOSPITAL_COMMUNITY): Payer: Self-pay | Admitting: Emergency Medicine

## 2015-12-01 ENCOUNTER — Emergency Department (HOSPITAL_COMMUNITY)
Admission: EM | Admit: 2015-12-01 | Discharge: 2015-12-01 | Disposition: A | Discharge status: Home / Self Care | Payer: PPO | Attending: Emergency Medicine | Admitting: Emergency Medicine

## 2015-12-01 DIAGNOSIS — F329 Major depressive disorder, single episode, unspecified: Secondary | ICD-10-CM | POA: Diagnosis not present

## 2015-12-01 DIAGNOSIS — E785 Hyperlipidemia, unspecified: Secondary | ICD-10-CM | POA: Insufficient documentation

## 2015-12-01 DIAGNOSIS — N200 Calculus of kidney: Secondary | ICD-10-CM | POA: Insufficient documentation

## 2015-12-01 DIAGNOSIS — F17210 Nicotine dependence, cigarettes, uncomplicated: Secondary | ICD-10-CM | POA: Diagnosis not present

## 2015-12-01 DIAGNOSIS — M542 Cervicalgia: Secondary | ICD-10-CM | POA: Diagnosis not present

## 2015-12-01 DIAGNOSIS — N132 Hydronephrosis with renal and ureteral calculous obstruction: Secondary | ICD-10-CM | POA: Diagnosis not present

## 2015-12-01 DIAGNOSIS — R350 Frequency of micturition: Secondary | ICD-10-CM | POA: Diagnosis not present

## 2015-12-01 DIAGNOSIS — Z791 Long term (current) use of non-steroidal anti-inflammatories (NSAID): Secondary | ICD-10-CM | POA: Diagnosis not present

## 2015-12-01 DIAGNOSIS — R52 Pain, unspecified: Secondary | ICD-10-CM

## 2015-12-01 DIAGNOSIS — Z79899 Other long term (current) drug therapy: Secondary | ICD-10-CM | POA: Insufficient documentation

## 2015-12-01 DIAGNOSIS — K5010 Crohn's disease of large intestine without complications: Secondary | ICD-10-CM | POA: Diagnosis not present

## 2015-12-01 DIAGNOSIS — J984 Other disorders of lung: Secondary | ICD-10-CM | POA: Diagnosis not present

## 2015-12-01 DIAGNOSIS — R109 Unspecified abdominal pain: Secondary | ICD-10-CM | POA: Diagnosis not present

## 2015-12-01 DIAGNOSIS — R1032 Left lower quadrant pain: Secondary | ICD-10-CM | POA: Diagnosis not present

## 2015-12-01 LAB — COMPREHENSIVE METABOLIC PANEL
ALT: 30 U/L (ref 17–63)
ANION GAP: 9 (ref 5–15)
AST: 42 U/L — ABNORMAL HIGH (ref 15–41)
Albumin: 4.2 g/dL (ref 3.5–5.0)
Alkaline Phosphatase: 89 U/L (ref 38–126)
BUN: 9 mg/dL (ref 6–20)
CO2: 21 mmol/L — ABNORMAL LOW (ref 22–32)
Calcium: 8.8 mg/dL — ABNORMAL LOW (ref 8.9–10.3)
Chloride: 107 mmol/L (ref 101–111)
Creatinine, Ser: 0.9 mg/dL (ref 0.61–1.24)
GFR calc Af Amer: >60 mL/min (ref >60)
Glucose, Bld: 148 mg/dL — ABNORMAL HIGH (ref 65–99)
POTASSIUM: 4.7 mmol/L (ref 3.5–5.1)
Sodium: 137 mmol/L (ref 135–145)
Total Bilirubin: 1.4 mg/dL — ABNORMAL HIGH (ref 0.3–1.2)
Total Protein: 7.1 g/dL (ref 6.5–8.1)

## 2015-12-01 LAB — CBC WITH DIFFERENTIAL/PLATELET
Basophils Absolute: 0 10*3/uL (ref 0.0–0.1)
Basophils Relative: 0 %
EOS ABS: 0.2 10*3/uL (ref 0.0–0.7)
EOS PCT: 2 %
HCT: 43.6 % (ref 39.0–52.0)
Hemoglobin: 14.5 g/dL (ref 13.0–17.0)
LYMPHS PCT: 14 %
Lymphs Abs: 1.3 10*3/uL (ref 0.7–4.0)
MCH: 30 pg (ref 26.0–34.0)
MCHC: 33.3 g/dL (ref 30.0–36.0)
MCV: 90.3 fL (ref 78.0–100.0)
MONO ABS: 0.4 10*3/uL (ref 0.1–1.0)
Monocytes Relative: 4 %
Neutro Abs: 6.9 10*3/uL (ref 1.7–7.7)
Neutrophils Relative %: 80 %
PLATELETS: 125 10*3/uL — AB (ref 150–400)
RBC: 4.83 MIL/uL (ref 4.22–5.81)
RDW: 13.7 % (ref 11.5–15.5)
WBC: 8.8 10*3/uL (ref 4.0–10.5)

## 2015-12-01 LAB — URINALYSIS, ROUTINE W REFLEX MICROSCOPIC
GLUCOSE, UA: NEGATIVE mg/dL
Nitrite: NEGATIVE
PH: 5.5 (ref 5.0–8.0)
Protein, ur: 30 mg/dL — AB
SPECIFIC GRAVITY, URINE: 1.025 (ref 1.005–1.030)

## 2015-12-01 LAB — URINE MICROSCOPIC-ADD ON

## 2015-12-01 MED ORDER — CEPHALEXIN 500 MG PO CAPS: 500.0000 mg | ORAL_CAPSULE | Freq: Four times a day (QID) | ORAL | Status: DC

## 2015-12-01 MED ORDER — HYDROMORPHONE HCL 2 MG/ML IJ SOLN
2.0000 mg | Freq: Once | INTRAMUSCULAR | Status: AC
  Administered 2015-12-01: 2 mg via INTRAVENOUS
  Filled 2015-12-01: qty 1

## 2015-12-01 MED ORDER — LORAZEPAM 2 MG/ML IJ SOLN
0.5000 mg | Freq: Once | INTRAMUSCULAR | Status: AC
  Administered 2015-12-01: 0.5 mg via INTRAVENOUS
  Filled 2015-12-01: qty 1

## 2015-12-01 MED ORDER — OXYCODONE-ACETAMINOPHEN 5-325 MG PO TABS: 1.0000 | ORAL_TABLET | Freq: Four times a day (QID) | ORAL | Status: DC | PRN

## 2015-12-01 MED ORDER — HYDROMORPHONE HCL 1 MG/ML IJ SOLN
1.0000 mg | Freq: Once | INTRAMUSCULAR | Status: AC
  Administered 2015-12-01: 1 mg via INTRAVENOUS
  Filled 2015-12-01: qty 1

## 2015-12-01 MED ORDER — ONDANSETRON 4 MG PO TBDP: ORAL_TABLET | ORAL | Status: DC

## 2015-12-01 MED ORDER — TAMSULOSIN HCL 0.4 MG PO CAPS: 0.4000 mg | ORAL_CAPSULE | Freq: Every day | ORAL | Status: DC

## 2015-12-01 MED ORDER — KETOROLAC TROMETHAMINE 30 MG/ML IJ SOLN
30.0000 mg | Freq: Once | INTRAMUSCULAR | Status: AC
  Administered 2015-12-01: 30 mg via INTRAVENOUS
  Filled 2015-12-01: qty 1

## 2015-12-01 MED ORDER — ACETAMINOPHEN 10 MG/ML IV SOLN
1000.0000 mg | Freq: Four times a day (QID) | INTRAVENOUS | Status: DC
  Administered 2015-12-01: 1000 mg via INTRAVENOUS
  Filled 2015-12-01 (×4): qty 100

## 2015-12-01 MED ORDER — DEXTROSE 5 % IV SOLN
1.0000 g | Freq: Once | INTRAVENOUS | Status: AC
  Administered 2015-12-01: 1 g via INTRAVENOUS
  Filled 2015-12-01: qty 10

## 2015-12-01 MED ORDER — ONDANSETRON HCL 4 MG/2ML IJ SOLN
4.0000 mg | Freq: Once | INTRAMUSCULAR | Status: AC
  Administered 2015-12-01: 4 mg via INTRAVENOUS
  Filled 2015-12-01: qty 2

## 2015-12-01 MED ORDER — ACETAMINOPHEN 10 MG/ML IV SOLN
INTRAVENOUS | Status: AC
  Filled 2015-12-01: qty 100

## 2015-12-01 NOTE — ED Provider Notes (Signed)
CSN: [id number]     Arrival date & time 12/01/15  1350 History   First MD Initiated Contact with Patient 12/01/15 1404     Chief Complaint  Patient presents with  . Flank Pain     (Consider location/radiation/quality/duration/timing/severity/associated sxs/prior Treatment) Patient is a 60 y.o. male presenting with flank pain. The history is provided by the patient (Patient complains of left flank pain for couple days some nausea).  Flank Pain This is a new problem. The current episode started 12 to 24 hours ago. The problem occurs constantly. The problem has not changed since onset.Associated symptoms include abdominal pain. Pertinent negatives include no chest pain and no headaches. Nothing aggravates the symptoms. Nothing relieves the symptoms.    Past Medical History  Diagnosis Date  . Hyperlipidemia   . Bipolar disorder ([place])   . Insomnia   . DDD (degenerative disc disease)   . Jejunal ulcer   . Depression   . Skin rash    Past Surgical History  Procedure Laterality Date  . Lumbar spine surgery    . Cervical spine surgery      x3  . Cholecystectomy    . Colonoscopy  2006  . Back surgery    . Colonoscopy N/A 06/30/2014    Procedure: COLONOSCOPY;  Surgeon: Rogene Houston, MD;  [facility];  Service: Endoscopy;  Laterality: N/A;  830  . Upper gastrointestinal endoscopy     Family History  Problem Relation Age of Onset  . Hypertension Sister   . Anxiety disorder Sister   . Hypertension Brother   . Hypertension Brother   . Hypertension Brother   . Dementia Mother   . Paranoid behavior Mother   . Bipolar disorder Father   . Dementia Father   . Alcohol abuse Paternal Uncle   . ADD / ADHD Neg Hx   . Depression Neg Hx   . OCD Neg Hx   . Schizophrenia Neg Hx   . Seizures Neg Hx   . Sexual abuse Neg Hx   . Physical abuse Neg Hx   . Colon cancer Neg Hx   . Drug abuse Paternal Uncle    Social History  Substance Use Topics  . Smoking status: Current Every  Day Smoker -- 1.00 packs/day for 34 years    Types: Cigarettes    Last Attempt to Quit: 02/23/2012  . Smokeless tobacco: None  . Alcohol Use: No    Review of Systems  Constitutional: Negative for appetite change and fatigue.  HENT: Negative for congestion, ear discharge and sinus pressure.   Eyes: Negative for discharge.  Respiratory: Negative for cough.   Cardiovascular: Negative for chest pain.  Gastrointestinal: Positive for abdominal pain. Negative for diarrhea.  Genitourinary: Positive for flank pain. Negative for frequency and hematuria.  Musculoskeletal: Negative for back pain.  Skin: Negative for rash.  Neurological: Negative for seizures and headaches.  Psychiatric/Behavioral: Negative for hallucinations.      Allergies  Elavil  Home Medications   Prior to Admission medications   Medication Sig Start Date End Date Taking? Authorizing Provider  ALPRAZolam Duanne Moron) 0.5 MG tablet Take 0.5 mg by mouth at bedtime.    Yes Historical Provider, MD  amLODipine (NORVASC) 5 MG tablet Take 1 tablet (5 mg total) by mouth daily. Patient taking differently: Take 5 mg by mouth at bedtime.  01/26/15  Yes Ezequiel Essex, MD  CYMBALTA 60 MG capsule Take 60 mg by mouth daily.  08/10/12  Yes Historical Provider, MD  fentaNYL (DURAGESIC - DOSED MCG/HR) 50 MCG/HR Apply 50 mcg/hr topically every 3 (three) days. 08/03/12  Yes Historical Provider, MD  ibuprofen (ADVIL,MOTRIN) 200 MG tablet Take 600 mg by mouth daily as needed for headache or moderate pain.    Yes Historical Provider, MD  lamoTRIgine (LAMICTAL) 100 MG tablet Take by mouth 1/2 in AM and 1 in the evening ongoing. Patient taking differently: Take 50-100 mg by mouth 2 (two) times daily. Take by mouth 1/2 in AM and 1 in the evening ongoing. 10/15/12  Yes Darrol Jump, MD  cephALEXin (KEFLEX) 500 MG capsule Take 1 capsule (500 mg total) by mouth 4 (four) times daily. 12/01/15   Milton Ferguson, MD  ezetimibe (ZETIA) 10 MG tablet Take 1  tablet by mouth daily. 12/01/15   Historical Provider, MD  ondansetron (ZOFRAN ODT) 4 MG disintegrating tablet 4mg  ODT q4 hours prn nausea/vomit 12/01/15   Milton Ferguson, MD  oxyCODONE-acetaminophen (PERCOCET/ROXICET) 5-325 MG tablet Take 1 tablet by mouth every 6 (six) hours as needed. 12/01/15   Milton Ferguson, MD  oxyCODONE-acetaminophen (PERCOCET/ROXICET) 5-325 MG tablet Take 1 tablet by mouth every 6 (six) hours as needed. 12/01/15   Milton Ferguson, MD  oxyCODONE-acetaminophen (PERCOCET/ROXICET) 5-325 MG tablet Take 1 tablet by mouth every 6 (six) hours as needed. 12/01/15   Milton Ferguson, MD  oxyCODONE-acetaminophen (PERCOCET/ROXICET) 5-325 MG tablet Take 1 tablet by mouth every 6 (six) hours as needed. 12/01/15   Milton Ferguson, MD  tamsulosin (FLOMAX) 0.4 MG CAPS capsule Take 1 capsule (0.4 mg total) by mouth daily. 12/01/15   Milton Ferguson, MD   BP 123/80 mmHg  Pulse 70  Resp 17  SpO2 99% Physical Exam  Constitutional: He is oriented to person, place, and time. He appears well-developed.  HENT:  Head: Normocephalic.  Eyes: Conjunctivae and EOM are normal. No scleral icterus.  Neck: Neck supple. No thyromegaly present.  Cardiovascular: Normal rate and regular rhythm.  Exam reveals no gallop and no friction rub.   No murmur heard. Pulmonary/Chest: No stridor. He has no wheezes. He has no rales. He exhibits no tenderness.  Abdominal: He exhibits no distension. There is tenderness. There is no rebound.  Mild tenderness left lower quadrant  Genitourinary:  Tender left flank  Musculoskeletal: Normal range of motion. He exhibits no edema.  Lymphadenopathy:    He has no cervical adenopathy.  Neurological: He is oriented to person, place, and time. He exhibits normal muscle tone. Coordination normal.  Skin: No rash noted. No erythema.  Psychiatric: He has a normal mood and affect. His behavior is normal.    ED Course  Procedures (including critical care time) Labs Review Labs Reviewed   URINALYSIS, ROUTINE W REFLEX MICROSCOPIC (NOT AT [facility]) - Abnormal; Notable for the following:    Hgb urine dipstick LARGE (*)    Bilirubin Urine SMALL (*)    Ketones, ur TRACE (*)    Protein, ur 30 (*)    Leukocytes, UA TRACE (*)    All other components within normal limits  CBC WITH DIFFERENTIAL/PLATELET - Abnormal; Notable for the following:    Platelets 125 (*)    All other components within normal limits  COMPREHENSIVE METABOLIC PANEL - Abnormal; Notable for the following:    CO2 21 (*)    Glucose, Bld 148 (*)    Calcium 8.8 (*)    AST 42 (*)    Total Bilirubin 1.4 (*)    All other components within normal limits  URINE MICROSCOPIC-ADD ON - Abnormal;  Notable for the following:    Squamous Epithelial / LPF 0-5 (*)    Bacteria, UA MANY (*)    Crystals CA OXALATE CRYSTALS (*)    All other components within normal limits  URINE CULTURE    Imaging Review Ct Renal Stone Study  12/01/2015  CLINICAL DATA:  Left flank pain starting 2:30 a.m. today, hematuria EXAM: CT ABDOMEN AND PELVIS WITHOUT CONTRAST TECHNIQUE: Multidetector CT imaging of the abdomen and pelvis was performed following the standard protocol without IV contrast. COMPARISON:  08/13/2015 FINDINGS: Lower chest:  Lung bases are unremarkable. Hepatobiliary: Unenhanced liver shows no biliary ductal dilatation. Status postcholecystectomy. No CBD dilatation. Pancreas: Unenhanced pancreas is unremarkable. Spleen: Unenhanced spleen is unremarkable Adrenals/Urinary Tract: No adrenal gland mass is noted. There is mild left hydronephrosis and minimal left hydroureter. Mild left perinephric stranding. No nephrolithiasis is noted. Axial image 73 there is calcified obstructive calculus in distal left ureter measures 4 mm about 2 cm from left UVJ. Limited assessment of urinary bladder which is empty. No calcified calculi are noted within urinary bladder. Stomach/Bowel: No gastric outlet obstruction. Moderate stool noted throughout the colon.  Moderate stool noted within cecum. Normal appendix noted in axial image 68. No small bowel obstruction. Terminal ileum is unremarkable. Vascular/Lymphatic: Mild atherosclerotic calcifications of distal abdominal aorta and iliac arteries. No aortic aneurysm. No mesenteric or retroperitoneal adenopathy. There is no inguinal adenopathy. Reproductive: Prostate gland and seminal vesicles are unremarkable. Other: No ascites or free air. Musculoskeletal: Postsurgical changes are noted lumbar spine at L5-S1 disc space. Probable prior laminectomy at this level. IMPRESSION: 1. There is mild left hydronephrosis and left hydroureter. Mild left perinephric stranding. 2. Axial image 73 there is 4 mm calcified obstructive calculus in distal left ureter about 2 cm from left UVJ. 3. Limited assessment of urinary bladder which is empty. 4. Status post cholecystectomy. 5. Moderate stool noted in right colon and cecum. No pericecal inflammation. Normal appendix. 6. No small bowel obstruction. 7. Postsurgical changes lumbar spine at L5-S1 disc space. Electronically Signed   By: Lahoma Crocker M.D.   On: 12/01/2015 15:02   I have personally reviewed and evaluated these images and lab results as part of my medical decision-making.   EKG Interpretation None      MDM   Final diagnoses:  Pain  Kidney stone    Patient with a 4 mm stone on the left. Patient's pain eventually was controlled in the emergency department. He was sent home with Percocet Zofran Flomax and Keflex and will follow-up with urology    Milton Ferguson, MD 12/01/15 1958

## 2015-12-01 NOTE — ED Notes (Signed)
Patient with left flank pain and hematuria since 0230 today. Reports having retention this morning that was resolved by drinking a lot of water and then had frank blood in urine. Patient reports no h/o kidney stones.

## 2015-12-01 NOTE — Discharge Instructions (Signed)
Follow-up with [facility] next week. Return if problems prior to then

## 2015-12-03 LAB — URINE CULTURE
CULTURE: NO GROWTH
Special Requests: NORMAL

## 2015-12-05 MED FILL — Oxycodone w/ Acetaminophen Tab 5-325 MG: ORAL | Qty: 6 | Status: AC

## 2015-12-07 DIAGNOSIS — N201 Calculus of ureter: Secondary | ICD-10-CM | POA: Diagnosis not present

## 2015-12-15 DIAGNOSIS — N201 Calculus of ureter: Secondary | ICD-10-CM | POA: Diagnosis not present

## 2016-02-06 DIAGNOSIS — Z1389 Encounter for screening for other disorder: Secondary | ICD-10-CM | POA: Diagnosis not present

## 2016-02-06 DIAGNOSIS — F329 Major depressive disorder, single episode, unspecified: Secondary | ICD-10-CM | POA: Diagnosis not present

## 2016-02-06 DIAGNOSIS — R5383 Other fatigue: Secondary | ICD-10-CM | POA: Diagnosis not present

## 2016-02-06 DIAGNOSIS — I10 Essential (primary) hypertension: Secondary | ICD-10-CM | POA: Diagnosis not present

## 2016-02-06 DIAGNOSIS — M542 Cervicalgia: Secondary | ICD-10-CM | POA: Diagnosis not present

## 2016-03-08 DIAGNOSIS — I10 Essential (primary) hypertension: Secondary | ICD-10-CM | POA: Diagnosis not present

## 2016-03-08 DIAGNOSIS — D72829 Elevated white blood cell count, unspecified: Secondary | ICD-10-CM | POA: Diagnosis not present

## 2016-03-08 DIAGNOSIS — R739 Hyperglycemia, unspecified: Secondary | ICD-10-CM | POA: Diagnosis not present

## 2016-03-08 DIAGNOSIS — M545 Low back pain: Secondary | ICD-10-CM | POA: Diagnosis not present

## 2016-03-08 DIAGNOSIS — F329 Major depressive disorder, single episode, unspecified: Secondary | ICD-10-CM | POA: Diagnosis not present

## 2016-03-12 ENCOUNTER — Other Ambulatory Visit (HOSPITAL_COMMUNITY): Payer: Self-pay | Admitting: Oncology

## 2016-04-09 DIAGNOSIS — Z299 Encounter for prophylactic measures, unspecified: Secondary | ICD-10-CM | POA: Diagnosis not present

## 2016-04-09 DIAGNOSIS — F329 Major depressive disorder, single episode, unspecified: Secondary | ICD-10-CM | POA: Diagnosis not present

## 2016-04-09 DIAGNOSIS — I10 Essential (primary) hypertension: Secondary | ICD-10-CM | POA: Diagnosis not present

## 2016-04-12 ENCOUNTER — Encounter (HOSPITAL_COMMUNITY): Payer: PPO

## 2016-04-12 ENCOUNTER — Encounter (HOSPITAL_COMMUNITY): Payer: PPO | Attending: Oncology | Admitting: Oncology

## 2016-04-12 ENCOUNTER — Encounter (HOSPITAL_COMMUNITY): Payer: Self-pay | Admitting: Oncology

## 2016-04-12 VITALS — BP 141/82 | HR 70 | Temp 98.0°F | Resp 16 | Ht 70.0 in | Wt 225.0 lb

## 2016-04-12 DIAGNOSIS — K5090 Crohn's disease, unspecified, without complications: Secondary | ICD-10-CM

## 2016-04-12 DIAGNOSIS — D72829 Elevated white blood cell count, unspecified: Secondary | ICD-10-CM

## 2016-04-12 DIAGNOSIS — Z720 Tobacco use: Secondary | ICD-10-CM | POA: Diagnosis not present

## 2016-04-12 DIAGNOSIS — D72828 Other elevated white blood cell count: Secondary | ICD-10-CM

## 2016-04-12 DIAGNOSIS — K5010 Crohn's disease of large intestine without complications: Secondary | ICD-10-CM

## 2016-04-12 HISTORY — DX: Elevated white blood cell count, unspecified: D72.829

## 2016-04-12 LAB — CBC WITH DIFFERENTIAL/PLATELET
Basophils Absolute: 0 10*3/uL (ref 0.0–0.1)
Basophils Relative: 0 %
Eosinophils Absolute: 1 10*3/uL — ABNORMAL HIGH (ref 0.0–0.7)
Eosinophils Relative: 10 %
HEMATOCRIT: 43.7 % (ref 39.0–52.0)
Hemoglobin: 15.1 g/dL (ref 13.0–17.0)
LYMPHS ABS: 2.4 10*3/uL (ref 0.7–4.0)
LYMPHS PCT: 23 %
MCH: 30.3 pg (ref 26.0–34.0)
MCHC: 34.6 g/dL (ref 30.0–36.0)
MCV: 87.6 fL (ref 78.0–100.0)
MONO ABS: 0.5 10*3/uL (ref 0.1–1.0)
MONOS PCT: 5 %
NEUTROS ABS: 6.5 10*3/uL (ref 1.7–7.7)
Neutrophils Relative %: 62 %
PLATELETS: 213 10*3/uL (ref 150–400)
RBC: 4.99 MIL/uL (ref 4.22–5.81)
RDW: 13.5 % (ref 11.5–15.5)
WBC: 10.5 10*3/uL (ref 4.0–10.5)

## 2016-04-12 LAB — SEDIMENTATION RATE: Sed Rate: 25 mm/hr — ABNORMAL HIGH (ref 0–16)

## 2016-04-12 LAB — C-REACTIVE PROTEIN: CRP: <0.5 mg/dL (ref <1.0)

## 2016-04-12 NOTE — Progress Notes (Signed)
[facility] Hematology/Oncology Consultation   Name: Stephen Porter      MRN: [id number]    Date: 04/13/2016 Time:10:27 PM   REFERRING PHYSICIAN:  Monico Blitz (Primary Care Provider)  REASON FOR CONSULT:  Elevated WBC   DIAGNOSIS:  Leukocytosis with neutrophilia predominance dating back to at least 2008  HISTORY OF PRESENT ILLNESS:  Stephen Porter is a 60 y.o. male with a medical history significant for depression, insomnia, hyperlipidemia, anxiety, DDD, and h/o crohn's disease who is referred to the [facility] for "elevated WBC."  I personally reviewed and went over laboratory results with the patient.  The results are noted within this dictation.  I can identify a leukocytosis with neutrophilia dating back to at least 2008.  I personally reviewed and went over radiographic studies with the patient.  The results are noted within this dictation.  Noncontributory.  Chart is reviewed.  He has been referred due to leukocytosis.  There is an instance of eosinophilia in June 2017 after having been WNL the month previous in May 2017.  I do not have record of a longstanding eosinophilia.  He is S/P colonoscopy by Dr. Laural Golden on 06/30/2014 showing: 1. Sessile polyp was found in the ascending colon; polypectomy was performed using snare cautery 2. Sessile polyp was found at the hepatic flexure; polypectomy was performed using snare cautery 3. Single polyp was found at the hepatic flexure; Destruction of lesion via ablation was attempted 4. Small external hemorrhoids 5. Both polyps were submitted together  I personally reviewed and went over pathology results with the patient.  Results are negative for any malignancy.  "This is news to me." He reports that he learned of an elevated white blood cell count when he saw his primary care physician for a regular physical exam. He reports that it's been a number of years since he had a physical exam. His story is that  laboratory work was performed early this year which demonstrated an elevated white blood cell count. It was subsequently rechecked approximately 2 more times to confirm this persistent leukocytosis. He notes that he was subsequently referred to [place] as result with most recent laboratory work being in June 2017.  He is asymptomatic. He denies any B symptoms. His weight is stable. Appetite is stable as well. He denies any new lumps or bumps.  He denies any chest pain or abdominal pain. He does report to a headache that comes and goes mainly in the occipital area.  He does have a history of multiple back surgeries including and neck surgery. As result, he has chronic pain. He was initially treated with OxyContin which he reports he weaned himself off of after being on this medication for years. He got all OxyContin approximately 6 years ago. He is now on fentanyl.Marland Kitchen He is on fentanyl 50 g. This is prescribed by his primary care physician.  In chart review, there is documentation of Crohn disease and ulcerative colitis.  He denies active symptoms.   Results for TOBEY, SCHMELZLE (MRN [id number])   Ref. Range 12/19/2009 12:30 12/20/2009 05:51 02/23/2012 19:45 01/26/2015 11:35 03/29/2015 12:11 08/13/2015 21:15 12/01/2015 14:00  WBC Latest Ref Range: 4.0 - 10.5 K/uL 11.0 (H) 8.2 11.2 (H) 9.9 9.8 13.3 (H) 8.8    Review of Systems  Constitutional: Negative for fever, chills, weight loss and malaise/fatigue.  HENT: Negative for nosebleeds and tinnitus.   Eyes: Negative.   Respiratory: Negative.   Cardiovascular:  Negative.   Gastrointestinal: Negative.  Negative for nausea, vomiting, abdominal pain, diarrhea and constipation.  Genitourinary: Negative.  Negative for dysuria.  Musculoskeletal: Negative.   Skin: Negative.   Neurological: Positive for headaches (intermittent, occipital). Negative for weakness.  Endo/Heme/Allergies: Negative.   Psychiatric/Behavioral: Negative.   14 point review of systems was performed  and is negative except as detailed under history of present illness and above    PAST MEDICAL HISTORY:   Past Medical History  Diagnosis Date  . Hyperlipidemia   . Bipolar disorder ([place])   . Insomnia   . DDD (degenerative disc disease)   . Jejunal ulcer   . Depression   . Skin rash   . Leukocytosis 04/12/2016    ALLERGIES: Allergies  Allergen Reactions  . Elavil [Amitriptyline] Other (See Comments)    Shaky      MEDICATIONS: I have reviewed the patient's current medications.    Current Outpatient Prescriptions on File Prior to Visit  Medication Sig Dispense Refill  . ALPRAZolam (XANAX) 0.5 MG tablet Take 0.5 mg by mouth at bedtime.     Marland Kitchen amLODipine (NORVASC) 5 MG tablet Take 1 tablet (5 mg total) by mouth daily. (Patient taking differently: Take 5 mg by mouth at bedtime. ) 30 tablet 0  . CYMBALTA 60 MG capsule Take 60 mg by mouth daily.     Marland Kitchen ezetimibe (ZETIA) 10 MG tablet Take 1 tablet by mouth daily.    . fentaNYL (DURAGESIC - DOSED MCG/HR) 50 MCG/HR Apply 50 mcg/hr topically every 3 (three) days.    Marland Kitchen ibuprofen (ADVIL,MOTRIN) 200 MG tablet Take 600 mg by mouth daily as needed for headache or moderate pain.     Marland Kitchen lamoTRIgine (LAMICTAL) 100 MG tablet Take by mouth 1/2 in AM and 1 in the evening ongoing. (Patient taking differently: Take 50-100 mg by mouth 2 (two) times daily. Take by mouth 1/2 in AM and 1 in the evening ongoing.) 45 tablet 2   No current facility-administered medications on file prior to visit.     PAST SURGICAL HISTORY Past Surgical History  Procedure Laterality Date  . Lumbar spine surgery    . Cervical spine surgery      x3  . Cholecystectomy    . Colonoscopy  2006  . Back surgery    . Colonoscopy N/A 06/30/2014    Procedure: COLONOSCOPY;  Surgeon: Rogene Houston, MD;  [facility];  Service: Endoscopy;  Laterality: N/A;  830  . Upper gastrointestinal endoscopy      FAMILY HISTORY: Family History  Problem Relation Age of Onset  .  Hypertension Sister   . Anxiety disorder Sister   . Hypertension Brother   . Hypertension Brother   . Hypertension Brother   . Dementia Mother   . Paranoid behavior Mother   . Bipolar disorder Father   . Dementia Father   . Alcohol abuse Paternal Uncle   . ADD / ADHD Neg Hx   . Depression Neg Hx   . OCD Neg Hx   . Schizophrenia Neg Hx   . Seizures Neg Hx   . Sexual abuse Neg Hx   . Physical abuse Neg Hx   . Colon cancer Neg Hx   . Drug abuse Paternal Uncle    Mother is alive at the age of 61 healthy. Father's deceased at the age of 29 secondary to complications of dementia. He has 3 brothers and 1 sister. He has 2 daughters, 54 and 68 years old both  healthy. He has 3 grandchildren, all healthy.  SOCIAL HISTORY:  reports that he has been smoking Cigarettes.  He has a 40 pack-year smoking history. He does not have any smokeless tobacco history on file. He reports that he does not drink alcohol or use illicit drugs.  He is Protestant in religion. He is on disability secondary to his back. 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 He is married 30 years.  Social History   Social History  . Marital Status: Married    Spouse Name: N/A  . Number of Children: N/A  . Years of Education: N/A   Occupational History  . Di[employer]bled    Social History Main Topics  . Smoking status: Current Every Day Smoker -- 1.00 packs/day for 40 years    Types: Cigarettes  . Smokeless tobacco: None  . Alcohol Use: No  . Drug Use: No  . Sexual Activity: Not Asked   Other Topics Concern  . None   Social History Narrative   Married with 3 children and lives locally   No regular exercise    PERFORMANCE STATUS: The patient's performance status is 1 - Symptomatic but completely ambulatory  PHYSICAL EXAM: Most Recent Vital Signs: Blood pressure 141/82, pulse 70, temperature 98 F (36.7 C), temperature source Oral, resp. rate 16, height _0  (1.778 m), weight 225 lb (102.059 kg),  SpO2 96 %. General appearance: alert, cooperative, appears stated age, no distress and unaccompanied Head: Normocephalic, without obvious abnormality, atraumatic Eyes: negative findings: lids and lashes normal, conjunctivae and sclerae normal and corneas clear Throat: lips, mucosa, and tongue normal; teeth and gums normal Neck: no adenopathy, supple, symmetrical, trachea midline and thyroid not enlarged, symmetric, no tenderness/mass/nodules Lungs: clear to auscultation bilaterally and normal percussion bilaterally Heart: regular rate and rhythm, S1, S2 normal, no murmur, click, rub or gallop Abdomen: soft, non-tender; bowel sounds normal; no masses,  no organomegaly Extremities: extremities normal, atraumatic, no cyanosis or edema Skin: Skin color, texture, turgor normal. No rashes or lesions Lymph nodes: Cervical, supraclavicular, and axillary nodes normal. Neurologic: Grossly normal  LABORATORY DATA:  Labs at Dr. Trena Platt office: 03/08/2016: WBC 12.4 HGB 16.0 PLT 290 Neut: 7.1 Eos: 12  02/07/2016 WBC 12.8 HGB 16.1 PLT 261 Neut 10.4 Eos 0.4  RADIOGRAPHY: No results found.     PATHOLOGY:    Diagnosis Colon, polyp(s), ascending, hepatic flexure - TUBULAR ADENOMA(2). NO HIGH GRADE DYSPLASIA OR MALIGNANCY IDENTIFIED. Claudette Laws MD Pathologist, Electronic Signature (Case signed 07/01/2014)  ASSESSMENT/PLAN:  Leukocytosis Neutrophilia History of Crohns/UC Tobacco Use  Leukocytosis with neutrophilia (with recent eosinophilia) dating back to at least 2008 in the setting of history of Crohn's disease and tobacco abuse.  Does have chronic pain from multiple back/neck surgeries is managed by his primary care physician; currently on fentanyl 50 g patches.  I reviewed the potential causes of leukocytosis (specifically mild neutrophilia) including but not limited to: ?Any active inflammatory condition or infection ?Cigarette smoking, which may be the most common cause of mild  neutrophilia ?Previously diagnosed hematologic disease (such as acute and chronic leukemias, chronic myeloproliferative or myelodysplastic disease) ?The presence of, and treatment for, a chronic anxiety state, panic disorder, rage, or emotional stress (eg, posttraumatic stress disorder, depression) ?Presence of non-hematologic diseases known to increase neutrophil counts (eg, eclampsia, thyroid storm, hypercortisolism). ?Prior splenectomy or known asplenia ?Positive family history of neutrophilia ?Recent vaccination  ?Medications - Various medications may cause neutrophilia. However,  such cases are rare and appear in  the literature as isolated case reports.  Plan today is to proceed with a CBC with peripheral smear review, evaluation for MPD, BCR-ABL to r/o CML, CRP and ESR to look for occult inflammatory disease  We will see the patient back once lab results are reported in approximately two weeks. We will go over everything at that time.  ORDERS PLACED FOR THIS ENCOUNTER: Orders Placed This Encounter  Procedures  . CBC with Differential  . Pathologist smear review  . Sedimentation rate  . C-reactive protein  . BCR-ABL1, CML/ALL, PCR, QUANT  . JAK2 V617F, Rfx CALR/E12/MPL   All questions were answered. The patient knows to call the clinic with any problems, questions or concerns. We can certainly see the patient much sooner if necessary.  This note is electronically signed [id number] Cyril Mourning, MD  04/13/2016 10:27 PM

## 2016-04-12 NOTE — Patient Instructions (Signed)
[place] at [facility] Discharge Instructions  RECOMMENDATIONS MADE BY THE CONSULTANT AND ANY TEST RESULTS WILL BE SENT TO YOUR REFERRING PHYSICIAN.  We are seeing you for your elevated white blood cell count.  This has been intermittently elevated going back to 2008. Labs will be performed today to look into this further. Return in 2-3 weeks for follow-up and to review lab results.  Thank you for choosing [place] at [facility] to provide your oncology and hematology care.  To afford each patient quality time with our provider, please arrive at least 15 minutes before your scheduled appointment time.   Beginning January 23rd 2017 lab work for the [facility] will be done in the  Main lab at [facility] on 1st floor. If you have a lab appointment with the [facility] please come in thru the  Main Entrance and check in at the main information desk  You need to re-schedule your appointment should you arrive 10 or more minutes late.  We strive to give you quality time with our providers, and arriving late affects you and other patients whose appointments are after yours.  Also, if you no show three or more times for appointments you may be dismissed from the clinic at the providers discretion.     Again, thank you for choosing [facility].  Our hope is that these requests will decrease the amount of time that you wait before being seen by our physicians.       _____________________________________________________________  Should you have questions after your visit to [facility], please contact our office at [phone] between the hours of 8:30 a.m. and 4:30 p.m.  Voicemails left after 4:30 p.m. will not be returned until the following business day.  For prescription refill requests, have your pharmacy contact our office.         Resources For Cancer Patients and their Caregivers ? American Cancer  Society: Can assist with transportation, wigs, general needs, runs Look Good Feel Better.        484-535-2054 ? Cancer Care: Provides financial assistance, online support groups, medication/co-pay assistance.  1-800-813-HOPE 5633928113) ? [place] Assists [place] cancer patients and their families through emotional , educational and financial support.  475-470-1314 ? [place] DSS Where to apply for food stamps, Medicaid and utility assistance. 864-665-6626 ? [facility]: Transportation to medical appointments. (310) 522-6166 ? Social Security Administration: May apply for disability if have a Stage IV cancer. 872-706-8838 774-507-9670 ? [employer], Disability and Transit Services: Assists with nutrition, care and transit needs. [place] Support Programs: @10RELATIVEDAYS @ > Cancer Support Group  2nd Tuesday of the month 1pm-2pm, Journey Room  > Creative Journey  3rd Tuesday of the month 1130am-1pm, Journey Room  > Look Good Feel Better  1st Wednesday of the month 10am-12 noon, Journey Room (Call [place] to register [phone])

## 2016-04-12 NOTE — Assessment & Plan Note (Addendum)
Leukocytosis with neutrophilia (with recent eosinophilia) dating back to at least 2008 in the setting of history of Crohn's disease and tobacco abuse.  Does have chronic pain from multiple back/neck surgeries is managed by his primary care physician; currently on fentanyl 50 g patches.  I reviewed the potential causes of leukocytosis (specifically mild neutrophilia) including but not limited to: ?Any active inflammatory condition or infection ?Cigarette smoking, which may be the most common cause of mild neutrophilia ?Previously diagnosed hematologic disease (such as acute and chronic leukemias, chronic myeloproliferative or myelodysplastic disease) ?The presence of, and treatment for, a chronic anxiety state, panic disorder, rage, or emotional stress (eg, posttraumatic stress disorder, depression) ?Presence of non-hematologic diseases known to increase neutrophil counts (eg, eclampsia, thyroid storm, hypercortisolism). ?Prior splenectomy or known asplenia ?Positive family history of neutrophilia ?Recent vaccination  ?Medications - Various medications may cause neutrophilia. However,  such cases are rare and appear in the literature as isolated case reports.  Plan today is to proceed with a CBC with peripheral smear review, evaluation for MPD, BCR-ABL to r/o CML, CRP and ESR to look for occult inflammatory disease  We will see the patient back once lab results are reported in approximately two weeks. We will go over everything at that time.

## 2016-04-13 ENCOUNTER — Encounter (HOSPITAL_COMMUNITY): Payer: Self-pay | Admitting: Oncology

## 2016-04-13 LAB — PATHOLOGIST SMEAR REVIEW

## 2016-04-16 LAB — BCR-ABL1, CML/ALL, PCR, QUANT

## 2016-04-18 ENCOUNTER — Encounter (HOSPITAL_COMMUNITY): Payer: Self-pay | Admitting: *Deleted

## 2016-04-23 LAB — CALR + JAK2 E12-15 + MPL (REFLEXED)

## 2016-04-23 LAB — JAK2 V617F, W REFLEX TO CALR/E12/MPL

## 2016-05-03 ENCOUNTER — Ambulatory Visit (HOSPITAL_COMMUNITY): Payer: Self-pay | Admitting: Adult Health

## 2016-05-06 ENCOUNTER — Encounter (HOSPITAL_COMMUNITY): Payer: Self-pay | Admitting: Oncology

## 2016-06-11 DIAGNOSIS — K5010 Crohn's disease of large intestine without complications: Secondary | ICD-10-CM | POA: Diagnosis not present

## 2016-06-11 DIAGNOSIS — K5190 Ulcerative colitis, unspecified, without complications: Secondary | ICD-10-CM | POA: Diagnosis not present

## 2016-06-11 DIAGNOSIS — Z713 Dietary counseling and surveillance: Secondary | ICD-10-CM | POA: Diagnosis not present

## 2016-06-11 DIAGNOSIS — M542 Cervicalgia: Secondary | ICD-10-CM | POA: Diagnosis not present

## 2016-07-05 ENCOUNTER — Ambulatory Visit (INDEPENDENT_AMBULATORY_CARE_PROVIDER_SITE_OTHER): Payer: PPO | Admitting: Psychiatry

## 2016-07-05 ENCOUNTER — Encounter (HOSPITAL_COMMUNITY): Payer: Self-pay | Admitting: Psychiatry

## 2016-07-05 DIAGNOSIS — F17210 Nicotine dependence, cigarettes, uncomplicated: Secondary | ICD-10-CM

## 2016-07-05 DIAGNOSIS — Z800 Family history of malignant neoplasm of digestive organs: Secondary | ICD-10-CM

## 2016-07-05 DIAGNOSIS — Z818 Family history of other mental and behavioral disorders: Secondary | ICD-10-CM | POA: Diagnosis not present

## 2016-07-05 DIAGNOSIS — Z79899 Other long term (current) drug therapy: Secondary | ICD-10-CM

## 2016-07-05 DIAGNOSIS — Z8249 Family history of ischemic heart disease and other diseases of the circulatory system: Secondary | ICD-10-CM

## 2016-07-05 DIAGNOSIS — F325 Major depressive disorder, single episode, in full remission: Secondary | ICD-10-CM

## 2016-07-05 DIAGNOSIS — Z813 Family history of other psychoactive substance abuse and dependence: Secondary | ICD-10-CM

## 2016-07-05 DIAGNOSIS — Z811 Family history of alcohol abuse and dependence: Secondary | ICD-10-CM

## 2016-07-05 MED ORDER — ALPRAZOLAM 0.5 MG PO TABS: 0.5000 mg | ORAL_TABLET | Freq: Two times a day (BID) | ORAL | 2 refills | Status: DC

## 2016-07-05 MED ORDER — DULOXETINE HCL 60 MG PO CPEP: 60.0000 mg | ORAL_CAPSULE | Freq: Two times a day (BID) | ORAL | 30 refills | Status: DC

## 2016-07-05 MED ORDER — LAMOTRIGINE 100 MG PO TABS: ORAL_TABLET | ORAL | 2 refills | Status: DC

## 2016-07-05 NOTE — Progress Notes (Signed)
Psychiatric Initial Adult Assessment   Patient Identification: Stephen Porter MRN:  [id number] Date of Evaluation:  07/05/2016 Referral Source: Dr. Manuella Ghazi Chief Complaint:   Chief Complaint    Depression; Anxiety; Establish Care     Visit Diagnosis:    ICD-9-CM ICD-10-CM   1. Major depressive disorder with single episode, in full remission ([place]) 296.26 F32.5     History of Present Illness:  This patient is a 59 year old married white male who lives with his wife in [place]. He has 2 grown daughters one son and 3 grandchildren. 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  The patient was referred by his primary physician, Dr. Brigitte Pulse, for further assessment and treatment of depression anxiety and chronic pain.  The patient states that as a younger person he never had any issues with depression. 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 In 2003 he had a neck injury and has had 3 surgeries on it and in 2005 had a back injury and had to have surgery on this as well. He stated that after the back surgery he was in a great deal of pain and dysfunction. He was definitely no longer able to work and he was very frustrated with this. He stated that the back surgeon gave him a high dosage of pain medication and he became addicted. He had to go to [facility] for detox and he claimed he was detoxed without any help with medication. A few months later he had to get on fentanyl patches for the pain.  In 2013 he was hospitalized at [facility] because he was so depressed and felt like nothing was worth living for. His father was declining as well and died shortly after this. While there he was started on Cymbalta Lamictal and several other medications which he no longer takes. He was followed here for a time by Dr. walker and Maurice Small for counseling. Eventually  however Dr. Manuella Ghazi took over his treatment.  The patient is now on a combination of Cymbalta, low-dose Xanax at bedtime Lamictal. He takes fentanyl patch every 3 days. He still is in a fair amount of pain but doesn't ever want to get back on pain pills. He states that for the most part his mood is pretty good but he is having a great deal of difficulty sleeping. He states it's been like this ever since he got on narcotics. He's developed a trick of taking the patch off before he goes to sleep and then he's able to sleep better. Benadryl helps as well. He still under good amount of pain during the day but tries to keep moving, babysitting his grandchildren. He denies being depressed now or having any thoughts of suicide. His energy is the greatest but he thinks it's because of his poor sleep. He eats well and his appetite is good and he denies any suicidal ideation or psychotic symptoms. He denies significant anxiety and uses Xanax 0.5 mg twice a day with good relief   Associated Signs/Symptoms: Depression Symptoms:  depressed mood, insomnia, psychomotor retardation, (Hypo) Manic Symptoms:   Anxiety Symptoms:  Excessive Worry, Psychotic Symptoms:   PTSD Symptoms:   Past Psychiatric History: The patient was hospitalized at behavioral health in 2013. After that he followed up here for a few visits with Dr. walker and Maurice Small  Previous Psychotropic Medications: Yes  Substance Abuse History in the last 12 months:  No.  Consequences of Substance Abuse: NA  Past Medical History:  Past Medical History:  Diagnosis Date  . Bipolar disorder ([place])   . DDD (degenerative disc disease)   . Depression   . Hyperlipidemia   . Insomnia   . Jejunal ulcer   . Leukocytosis 04/12/2016  . Skin rash     Past Surgical History:  Procedure Laterality Date  . BACK SURGERY    . CERVICAL SPINE SURGERY     x3  . CHOLECYSTECTOMY    . COLONOSCOPY  2006  . COLONOSCOPY N/A 06/30/2014   Procedure:  COLONOSCOPY;  Surgeon: Rogene Houston, MD;  [facility];  Service: Endoscopy;  Laterality: N/A;  830  . LUMBAR SPINE SURGERY    . UPPER GASTROINTESTINAL ENDOSCOPY      Family Psychiatric History: The patient's sister has a history of anxiety and an uncle has a history of drug and alcohol abuse  Family History:  Family History  Problem Relation Age of Onset  . Hypertension Sister   . Anxiety disorder Sister   . Hypertension Brother   . Hypertension Brother   . Hypertension Brother   . Dementia Mother   . Paranoid behavior Mother   . Dementia Father   . Alcohol abuse Paternal Uncle   . Drug abuse Paternal Uncle   . ADD / ADHD Neg Hx   . Depression Neg Hx   . OCD Neg Hx   . Schizophrenia Neg Hx   . Seizures Neg Hx   . Sexual abuse Neg Hx   . Physical abuse Neg Hx   . Colon cancer Neg Hx     Social History:   Social History   Social History  . Marital status: Married    Spouse name: N/A  . Number of children: N/A  . Years of education: N/A   Occupational History  . [employer] Unemployed   Social History Main Topics  . Smoking status: Current Every Day Smoker    Packs/day: 1.00    Years: 40.00    Types: Cigarettes  . Smokeless tobacco: Current User     Comment: 07-05-2016 Dip  . Alcohol use No     Comment: 07-05-2016 per pt no  . Drug use: No     Comment: 07-05-2016 per pt no and he stopped Marijuana 1 yr ago  . Sexual activity: Not Asked   Other Topics Concern  . None   Social History Narrative   Married with 3 children and lives locally   No regular exercise    Additional Social History: Patient grew up in [place] with 3 brothers and 1 sister and both parents. He states that his father was strict but there is no history of abuse or trauma. He quit school in the ninth grade to start working. He states that his family was very poor. 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 He had to quit work around 2005 after his neck  and back injuries. He has 3 children and 3 grandchildren and he and his wife take care of the grandchildren while her children work  Allergies:   Allergies  Allergen Reactions  . Elavil [Amitriptyline] Other (See Comments)    Shaky    Metabolic Disorder Labs: No results found for: HGBA1C, MPG No results found for: PROLACTIN Lab Results  Component Value Date   CHOL 272 (H) 07/04/2010   TRIG 706 (H) 07/04/2010  HDL 30 (L) 07/04/2010   CHOLHDL 9.1 Ratio 07/04/2010   VLDL NOT CALC mg/dL 07/04/2010   LDLCALC See Comment mg/dL 07/04/2010   LDLCALC  12/20/2009    UNABLE TO CALCULATE IF TRIGLYCERIDE OVER 400 mg/dL        Total Cholesterol/HDL:CHD Risk Coronary Heart Disease Risk Table                     Men   Women  1/2 Average Risk   3.4   3.3  Average Risk       5.0   4.4  2 X Average Risk   9.6   7.1  3 X Average Risk  23.4   11.0        Use the calculated Patient Ratio above and the CHD Risk Table to determine the patient's CHD Risk.        ATP III CLASSIFICATION (LDL):  <100     mg/dL   Optimal  100-129  mg/dL   Near or Above                    Optimal  130-159  mg/dL   Borderline  160-189  mg/dL   High  >190     mg/dL   Very High     Current Medications: Current Outpatient Prescriptions  Medication Sig Dispense Refill  . ALPRAZolam (XANAX) 0.5 MG tablet Take 1 tablet (0.5 mg total) by mouth 2 (two) times daily. 60 tablet 2  . amLODipine (NORVASC) 5 MG tablet Take 5 mg by mouth at bedtime.    . baclofen (LIORESAL) 10 MG tablet Take 10 mg by mouth daily.    . DULoxetine (CYMBALTA) 60 MG capsule Take 1 capsule (60 mg total) by mouth 2 (two) times daily. 120 capsule 30  . ezetimibe (ZETIA) 10 MG tablet Take 1 tablet by mouth daily.    . fentaNYL (DURAGESIC - DOSED MCG/HR) 50 MCG/HR Apply 50 mcg/hr topically every 3 (three) days.    Marland Kitchen ibuprofen (ADVIL,MOTRIN) 200 MG tablet Take 600 mg by mouth daily as needed for headache or moderate pain.     Marland Kitchen lamoTRIgine (LAMICTAL)  100 MG tablet 60Taking 0.5 Tablet QAM andTaking 1 Tablet QHS 60 tablet 2   No current facility-administered medications for this visit.     Neurologic: Headache: No Seizure: No Paresthesias:No  Musculoskeletal: Strength & Muscle Tone: decreased Gait & Station: shuffle Patient leans: N/A  Psychiatric Specialty Exam: Review of Systems  Constitutional: Positive for malaise/fatigue.  Musculoskeletal: Positive for back pain and neck pain.  Psychiatric/Behavioral: Positive for depression. The patient is nervous/anxious and has insomnia.   All other systems reviewed and are negative.   Blood pressure (!) 145/79, pulse 73, height 5\' 10"  (1.778 m), weight 212 lb 9.6 oz (96.4 kg).Body mass index is 30.5 kg/m.  General Appearance: Casual and Fairly Groomed  Eye Contact:  Good  Speech:  Clear and Coherent  Volume:  Normal  Mood:  Euthymic  Affect:  Congruent  Thought Process:  Goal Directed  Orientation:  Full (Time, Place, and Person)  Thought Content:  WDL  Suicidal Thoughts:  No  Homicidal Thoughts:  No  Memory:  Good   Judgement:  Good  Insight:  Fair  Psychomotor Activity:  Decreased  Concentration:  Concentration: Good and Attention Span: Good  Recall:  Good  Fund of Knowledge:Good  Language: Good  Akathisia:  No  Handed:  Right  AIMS (if indicated):    Assets:  Communication Skills Desire for Improvement Leisure Time Resilience Social Support Talents/Skills  ADL's:  Intact  Cognition: WNL  Sleep:  poor    Treatment Plan Summary: Medication management   This patient is a [age]-year-old white male with a history of depression related to chronic pain and loss of function. He has a remote history of narcotic  addiction and withdrawal. He is much more stable now and denies being seriously depressed but is still in a fair amount of pain and has difficulty sleeping. I suggested increasing his Cymbalta to 60 mg twice a day, continuing the Lamictal for mood stabilization  and continue the low-dose Xanax 0.5 mg twice a day for anxiety. He can use Benadryl to help with sleep and if taking the patch off works he can do that as well. I will take over the prescribing of his psychiatric medications and he'll return to see me in 2 months    Levonne Spiller, MD 10/12/20173:05 PM

## 2016-07-09 DIAGNOSIS — F329 Major depressive disorder, single episode, unspecified: Secondary | ICD-10-CM | POA: Diagnosis not present

## 2016-07-09 DIAGNOSIS — I10 Essential (primary) hypertension: Secondary | ICD-10-CM | POA: Diagnosis not present

## 2016-07-09 DIAGNOSIS — M542 Cervicalgia: Secondary | ICD-10-CM | POA: Diagnosis not present

## 2016-07-09 DIAGNOSIS — Z299 Encounter for prophylactic measures, unspecified: Secondary | ICD-10-CM | POA: Diagnosis not present

## 2016-07-09 DIAGNOSIS — Z6831 Body mass index (BMI) 31.0-31.9, adult: Secondary | ICD-10-CM | POA: Diagnosis not present

## 2016-08-30 DIAGNOSIS — Z299 Encounter for prophylactic measures, unspecified: Secondary | ICD-10-CM | POA: Diagnosis not present

## 2016-08-30 DIAGNOSIS — J029 Acute pharyngitis, unspecified: Secondary | ICD-10-CM | POA: Diagnosis not present

## 2016-08-30 DIAGNOSIS — F17210 Nicotine dependence, cigarettes, uncomplicated: Secondary | ICD-10-CM | POA: Diagnosis not present

## 2016-08-30 DIAGNOSIS — Z713 Dietary counseling and surveillance: Secondary | ICD-10-CM | POA: Diagnosis not present

## 2016-08-30 DIAGNOSIS — Z6831 Body mass index (BMI) 31.0-31.9, adult: Secondary | ICD-10-CM | POA: Diagnosis not present

## 2016-08-30 DIAGNOSIS — R509 Fever, unspecified: Secondary | ICD-10-CM | POA: Diagnosis not present

## 2016-09-04 ENCOUNTER — Ambulatory Visit (INDEPENDENT_AMBULATORY_CARE_PROVIDER_SITE_OTHER): Payer: PPO | Admitting: Psychiatry

## 2016-09-04 ENCOUNTER — Encounter (HOSPITAL_COMMUNITY): Payer: Self-pay | Admitting: Psychiatry

## 2016-09-04 VITALS — BP 130/84 | HR 75 | Ht 70.0 in | Wt 209.8 lb

## 2016-09-04 DIAGNOSIS — Z811 Family history of alcohol abuse and dependence: Secondary | ICD-10-CM | POA: Diagnosis not present

## 2016-09-04 DIAGNOSIS — F17210 Nicotine dependence, cigarettes, uncomplicated: Secondary | ICD-10-CM

## 2016-09-04 DIAGNOSIS — Z9889 Other specified postprocedural states: Secondary | ICD-10-CM

## 2016-09-04 DIAGNOSIS — Z888 Allergy status to other drugs, medicaments and biological substances status: Secondary | ICD-10-CM

## 2016-09-04 DIAGNOSIS — Z813 Family history of other psychoactive substance abuse and dependence: Secondary | ICD-10-CM

## 2016-09-04 DIAGNOSIS — F325 Major depressive disorder, single episode, in full remission: Secondary | ICD-10-CM | POA: Diagnosis not present

## 2016-09-04 DIAGNOSIS — Z818 Family history of other mental and behavioral disorders: Secondary | ICD-10-CM

## 2016-09-04 DIAGNOSIS — Z8249 Family history of ischemic heart disease and other diseases of the circulatory system: Secondary | ICD-10-CM

## 2016-09-04 MED ORDER — ALPRAZOLAM 0.5 MG PO TABS: 0.5000 mg | ORAL_TABLET | Freq: Two times a day (BID) | ORAL | 2 refills | Status: DC

## 2016-09-04 MED ORDER — DULOXETINE HCL 60 MG PO CPEP: 60.0000 mg | ORAL_CAPSULE | Freq: Two times a day (BID) | ORAL | 30 refills | Status: DC

## 2016-09-04 MED ORDER — LAMOTRIGINE 100 MG PO TABS: ORAL_TABLET | ORAL | 2 refills | Status: DC

## 2016-09-04 NOTE — Progress Notes (Signed)
Psychiatric Initial Adult Assessment   Patient Identification: Stephen Porter MRN:  [id number] Date of Evaluation:  09/04/2016 Referral Source: Dr. Manuella Ghazi Chief Complaint:   Chief Complaint    Follow-up; Depression; Anxiety     Visit Diagnosis:    ICD-9-CM ICD-10-CM   1. Major depressive disorder with single episode, in full remission ([place]) 296.26 F32.5     History of Present Illness:  This patient is a 60 year old married white male who lives with his wife in [place]. He has 2 grown daughters one son and 3 grandchildren. 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  The patient was referred by his primary physician, Dr. Brigitte Pulse, for further assessment and treatment of depression anxiety and chronic pain.  The patient states that as a younger person he never had any issues with depression. 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 In 2003 he had a neck injury and has had 3 surgeries on it and in 2005 had a back injury and had to have surgery on this as well. He stated that after the back surgery he was in a great deal of pain and dysfunction. He was definitely no longer able to work and he was very frustrated with this. He stated that the back surgeon gave him a high dosage of pain medication and he became addicted. He had to go to [facility] for detox and he claimed he was detoxed without any help with medication. A few months later he had to get on fentanyl patches for the pain.  In 2013 he was hospitalized at [facility] because he was so depressed and felt like nothing was worth living for. His father was declining as well and died shortly after this. While there he was started on Cymbalta Lamictal and several other medications which he no longer takes. He was followed here for a time by Dr. walker and Maurice Small for counseling. Eventually  however Dr. Manuella Ghazi took over his treatment.  The patient is now on a combination of Cymbalta, low-dose Xanax at bedtime Lamictal. He takes fentanyl patch every 3 days. He still is in a fair amount of pain but doesn't ever want to get back on pain pills. He states that for the most part his mood is pretty good but he is having a great deal of difficulty sleeping. He states it's been like this ever since he got on narcotics. He's developed a trick of taking the patch off before he goes to sleep and then he's able to sleep better. Benadryl helps as well. He still under good amount of pain during the day but tries to keep moving, babysitting his grandchildren. He denies being depressed now or having any thoughts of suicide. His energy is the greatest but he thinks it's because of his poor sleep. He eats well and his appetite is good and he denies any suicidal ideation or psychotic symptoms. He denies significant anxiety and uses Xanax 0.5 mg twice a day with good relief   The patient returns after 2 months. He seems to be doing somewhat better with the increase in Cymbalta. He still has a lot of difficulty with chronic pain. He also has trouble sleeping due to pain. He wonders if the Lamictal might be keeping him up at night and he is going to try taking the higher dosage in the morning and the  lower at night. His mood is been fairly good and he is enjoying time with his grandchildren. He denies any suicidal ideation. He is been cutting down his food and slowly losing weight.  Associated Signs/Symptoms: Depression Symptoms:  depressed mood, insomnia, psychomotor retardation, (Hypo) Manic Symptoms:   Anxiety Symptoms:  Excessive Worry, Psychotic Symptoms:   PTSD Symptoms:   Past Psychiatric History: The patient was hospitalized at behavioral health in 2013. After that he followed up here for a few visits with Dr. walker and Maurice Small  Previous Psychotropic Medications: Yes   Substance Abuse History in  the last 12 months:  No.  Consequences of Substance Abuse: NA  Past Medical History:  Past Medical History:  Diagnosis Date  . Bipolar disorder ([place])   . DDD (degenerative disc disease)   . Depression   . Hyperlipidemia   . Insomnia   . Jejunal ulcer   . Leukocytosis 04/12/2016  . Skin rash     Past Surgical History:  Procedure Laterality Date  . BACK SURGERY    . CERVICAL SPINE SURGERY     x3  . CHOLECYSTECTOMY    . COLONOSCOPY  2006  . COLONOSCOPY N/A 06/30/2014   Procedure: COLONOSCOPY;  Surgeon: Rogene Houston, MD;  [facility];  Service: Endoscopy;  Laterality: N/A;  830  . LUMBAR SPINE SURGERY    . UPPER GASTROINTESTINAL ENDOSCOPY      Family Psychiatric History: The patient's sister has a history of anxiety and an uncle has a history of drug and alcohol abuse  Family History:  Family History  Problem Relation Age of Onset  . Hypertension Sister   . Anxiety disorder Sister   . Hypertension Brother   . Hypertension Brother   . Hypertension Brother   . Dementia Mother   . Paranoid behavior Mother   . Dementia Father   . Alcohol abuse Paternal Uncle   . Drug abuse Paternal Uncle   . ADD / ADHD Neg Hx   . Depression Neg Hx   . OCD Neg Hx   . Schizophrenia Neg Hx   . Seizures Neg Hx   . Sexual abuse Neg Hx   . Physical abuse Neg Hx   . Colon cancer Neg Hx     Social History:   Social History   Social History  . Marital status: Married    Spouse name: N/A  . Number of children: N/A  . Years of education: N/A   Occupational History  . [employer] Unemployed   Social History Main Topics  . Smoking status: Current Every Day Smoker    Packs/day: 1.00    Years: 40.00    Types: Cigarettes  . Smokeless tobacco: Current User     Comment: 07-05-2016 Dip  . Alcohol use No     Comment: 07-05-2016 per pt no  . Drug use: No     Comment: 07-05-2016 per pt no and he stopped Marijuana 1 yr ago  . Sexual activity: Not Asked   Other Topics Concern   . None   Social History Narrative   Married with 3 children and lives locally   No regular exercise    Additional Social History: Patient grew up in [place] with 3 brothers and 1 sister and both parents. He states that his father was strict but there is no history of abuse or trauma. He quit school in the ninth grade to start working. He states that his family was very poor. 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 He has 3 children and 3 grandchildren and he and his wife take care of the grandchildren while her children work  Allergies:   Allergies  Allergen Reactions  . Elavil [Amitriptyline] Other (See Comments)    Shaky    Metabolic Disorder Labs: No results found for: HGBA1C, MPG No results found for: PROLACTIN Lab Results  Component Value Date   CHOL 272 (H) 07/04/2010   TRIG 706 (H) 07/04/2010   HDL 30 (L) 07/04/2010   CHOLHDL 9.1 Ratio 07/04/2010   VLDL NOT CALC mg/dL 07/04/2010   LDLCALC See Comment mg/dL 07/04/2010   LDLCALC  12/20/2009    UNABLE TO CALCULATE IF TRIGLYCERIDE OVER 400 mg/dL        Total Cholesterol/HDL:CHD Risk Coronary Heart Disease Risk Table                     Men   Women  1/2 Average Risk   3.4   3.3  Average Risk       5.0   4.4  2 X Average Risk   9.6   7.1  3 X Average Risk  23.4   11.0        Use the calculated Patient Ratio above and the CHD Risk Table to determine the patient's CHD Risk.        ATP III CLASSIFICATION (LDL):  <100     mg/dL   Optimal  100-129  mg/dL   Near or Above                    Optimal  130-159  mg/dL   Borderline  160-189  mg/dL   High  >190     mg/dL   Very High     Current Medications: Current Outpatient Prescriptions  Medication Sig Dispense Refill  . ALPRAZolam (XANAX) 0.5 MG tablet Take 1 tablet (0.5 mg total) by mouth 2 (two) times daily. 60 tablet 2  . amLODipine (NORVASC) 5 MG tablet Take 5 mg by mouth  at bedtime.    . baclofen (LIORESAL) 10 MG tablet Take 10 mg by mouth daily.    . DULoxetine (CYMBALTA) 60 MG capsule Take 1 capsule (60 mg total) by mouth 2 (two) times daily. 120 capsule 30  . ezetimibe (ZETIA) 10 MG tablet Take 1 tablet by mouth daily.    . fentaNYL (DURAGESIC - DOSED MCG/HR) 50 MCG/HR Apply 50 mcg/hr topically every 3 (three) days.    Marland Kitchen ibuprofen (ADVIL,MOTRIN) 200 MG tablet Take 600 mg by mouth daily as needed for headache or moderate pain.     Marland Kitchen lamoTRIgine (LAMICTAL) 100 MG tablet Taking 0.5 tablets in AM 1 tablet at Night 60 tablet 2   No current facility-administered medications for this visit.     Neurologic: Headache: No Seizure: No Paresthesias:No  Musculoskeletal: Strength & Muscle Tone: decreased Gait & Station: shuffle Patient leans: N/A  Psychiatric Specialty Exam: Review of Systems  Constitutional: Positive for malaise/fatigue.  Musculoskeletal: Positive for back pain and neck pain.  Psychiatric/Behavioral: Positive for depression. The patient is nervous/anxious and has insomnia.   All other systems reviewed and are negative.   Blood pressure 130/84, pulse 75, height 5\' 10"  (1.778 m), weight 209 lb 12.8 oz (95.2 kg).Body mass index is 30.1 kg/m.  General Appearance: Casual and Fairly Groomed  Eye Contact:  Good  Speech:  Clear and Coherent  Volume:  Normal  Mood:  Euthymic  Affect:  Congruent  Thought Process:  Goal Directed  Orientation:  Full (Time, Place, and Person)  Thought Content:  WDL  Suicidal Thoughts:  No  Homicidal Thoughts:  No  Memory:  Good   Judgement:  Good  Insight:  Fair  Psychomotor Activity:  Decreased  Concentration:  Concentration: Good and Attention Span: Good  Recall:  Good  Fund of Knowledge:Good  Language: Good  Akathisia:  No  Handed:  Right  AIMS (if indicated):    Assets:  Communication Skills Desire for Improvement Leisure Time Resilience Social Support Talents/Skills  ADL's:  Intact   Cognition: WNL  Sleep:  poor    Treatment Plan Summary: Medication management   The patient will continue Cymbalta 60 g twice a day for depression and pain management, Lamictal 100 mg in the morning and 50 mg in the evening for mood stabilization and Xanax 0.5 mg twice a day for anxiety and sleep. He'll return to see me in 3 months   Edson Deridder, Neoma Laming, MD 12/12/20171:51 PMPatient ID: Weldon Picking, male   DOB: December 09, 1955, 60 y.o.   MRN: [id number]

## 2016-09-10 DIAGNOSIS — G8929 Other chronic pain: Secondary | ICD-10-CM | POA: Diagnosis not present

## 2016-09-10 DIAGNOSIS — Z79899 Other long term (current) drug therapy: Secondary | ICD-10-CM | POA: Diagnosis not present

## 2016-09-10 DIAGNOSIS — Z713 Dietary counseling and surveillance: Secondary | ICD-10-CM | POA: Diagnosis not present

## 2016-09-10 DIAGNOSIS — Z299 Encounter for prophylactic measures, unspecified: Secondary | ICD-10-CM | POA: Diagnosis not present

## 2016-09-10 DIAGNOSIS — Z6831 Body mass index (BMI) 31.0-31.9, adult: Secondary | ICD-10-CM | POA: Diagnosis not present

## 2016-11-12 DIAGNOSIS — Z6832 Body mass index (BMI) 32.0-32.9, adult: Secondary | ICD-10-CM | POA: Diagnosis not present

## 2016-11-12 DIAGNOSIS — Z7189 Other specified counseling: Secondary | ICD-10-CM | POA: Diagnosis not present

## 2016-11-12 DIAGNOSIS — Z1211 Encounter for screening for malignant neoplasm of colon: Secondary | ICD-10-CM | POA: Diagnosis not present

## 2016-11-12 DIAGNOSIS — Z0000 Encounter for general adult medical examination without abnormal findings: Secondary | ICD-10-CM | POA: Diagnosis not present

## 2016-11-12 DIAGNOSIS — E7800 Pure hypercholesterolemia, unspecified: Secondary | ICD-10-CM | POA: Diagnosis not present

## 2016-11-12 DIAGNOSIS — I10 Essential (primary) hypertension: Secondary | ICD-10-CM | POA: Diagnosis not present

## 2016-11-12 DIAGNOSIS — F329 Major depressive disorder, single episode, unspecified: Secondary | ICD-10-CM | POA: Diagnosis not present

## 2016-11-12 DIAGNOSIS — M542 Cervicalgia: Secondary | ICD-10-CM | POA: Diagnosis not present

## 2016-11-12 DIAGNOSIS — Z299 Encounter for prophylactic measures, unspecified: Secondary | ICD-10-CM | POA: Diagnosis not present

## 2016-11-12 DIAGNOSIS — R5383 Other fatigue: Secondary | ICD-10-CM | POA: Diagnosis not present

## 2016-11-12 DIAGNOSIS — Z1389 Encounter for screening for other disorder: Secondary | ICD-10-CM | POA: Diagnosis not present

## 2016-11-12 DIAGNOSIS — K5190 Ulcerative colitis, unspecified, without complications: Secondary | ICD-10-CM | POA: Diagnosis not present

## 2016-12-03 ENCOUNTER — Ambulatory Visit (HOSPITAL_COMMUNITY): Payer: Self-pay | Admitting: Psychiatry

## 2016-12-03 ENCOUNTER — Encounter (HOSPITAL_COMMUNITY): Payer: Self-pay

## 2016-12-13 ENCOUNTER — Encounter (HOSPITAL_COMMUNITY): Payer: Self-pay | Admitting: Psychiatry

## 2016-12-13 ENCOUNTER — Ambulatory Visit (INDEPENDENT_AMBULATORY_CARE_PROVIDER_SITE_OTHER): Payer: PPO | Admitting: Psychiatry

## 2016-12-13 VITALS — BP 130/76 | Ht 66.0 in | Wt 236.0 lb

## 2016-12-13 DIAGNOSIS — Z79899 Other long term (current) drug therapy: Secondary | ICD-10-CM

## 2016-12-13 DIAGNOSIS — F17210 Nicotine dependence, cigarettes, uncomplicated: Secondary | ICD-10-CM | POA: Diagnosis not present

## 2016-12-13 DIAGNOSIS — Z888 Allergy status to other drugs, medicaments and biological substances status: Secondary | ICD-10-CM

## 2016-12-13 DIAGNOSIS — Z811 Family history of alcohol abuse and dependence: Secondary | ICD-10-CM | POA: Diagnosis not present

## 2016-12-13 DIAGNOSIS — Z810 Family history of intellectual disabilities: Secondary | ICD-10-CM

## 2016-12-13 DIAGNOSIS — F325 Major depressive disorder, single episode, in full remission: Secondary | ICD-10-CM | POA: Diagnosis not present

## 2016-12-13 DIAGNOSIS — Z813 Family history of other psychoactive substance abuse and dependence: Secondary | ICD-10-CM

## 2016-12-13 MED ORDER — ALPRAZOLAM 0.5 MG PO TABS: 0.5000 mg | ORAL_TABLET | Freq: Two times a day (BID) | ORAL | 2 refills | Status: DC

## 2016-12-13 MED ORDER — DULOXETINE HCL 60 MG PO CPEP: 60.0000 mg | ORAL_CAPSULE | Freq: Two times a day (BID) | ORAL | 30 refills | Status: DC

## 2016-12-13 MED ORDER — TRAZODONE HCL 100 MG PO TABS: 100.0000 mg | ORAL_TABLET | Freq: Every day | ORAL | 2 refills | Status: DC

## 2016-12-13 MED ORDER — LAMOTRIGINE 100 MG PO TABS: ORAL_TABLET | ORAL | 2 refills | Status: DC

## 2016-12-13 NOTE — Progress Notes (Signed)
Psychiatric Initial Adult Assessment   Patient Identification: Stephen Porter MRN:  [id number] Date of Evaluation:  12/13/2016 Referral Source: Dr. Manuella Ghazi Chief Complaint:    Visit Diagnosis:    ICD-9-CM ICD-10-CM   1. Major depressive disorder with single episode, in full remission ([place]) 296.26 F32.5     History of Present Illness:  This patient is a 62 year old married white male who lives with his wife in [place]. He has 2 grown daughters one son and 3 grandchildren. 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  The patient was referred by his primary physician, Dr. Brigitte Pulse, for further assessment and treatment of depression anxiety and chronic pain.  The patient states that as a younger person he never had any issues with depression. 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 In 2003 he had a neck injury and has had 3 surgeries on it and in 2005 had a back injury and had to have surgery on this as well. He stated that after the back surgery he was in a great deal of pain and dysfunction. He was definitely no longer able to work and he was very frustrated with this. He stated that the back surgeon gave him a high dosage of pain medication and he became addicted. He had to go to [facility] for detox and he claimed he was detoxed without any help with medication. A few months later he had to get on fentanyl patches for the pain.  In 2013 he was hospitalized at behavioral [facility] because he was so depressed and felt like nothing was worth living for. His father was declining as well and died shortly after this. While there he was started on Cymbalta Lamictal and several other medications which he no longer takes. He was followed here for a time by Dr. walker and Maurice Small for counseling. Eventually however Dr. Manuella Ghazi took over his treatment.  The patient is now  on a combination of Cymbalta, low-dose Xanax at bedtime Lamictal. He takes fentanyl patch every 3 days. He still is in a fair amount of pain but doesn't ever want to get back on pain pills. He states that for the most part his mood is pretty good but he is having a great deal of difficulty sleeping. He states it's been like this ever since he got on narcotics. He's developed a trick of taking the patch off before he goes to sleep and then he's able to sleep better. Benadryl helps as well. He still under good amount of pain during the day but tries to keep moving, babysitting his grandchildren. He denies being depressed now or having any thoughts of suicide. His energy is the greatest but he thinks it's because of his poor sleep. He eats well and his appetite is good and he denies any suicidal ideation or psychotic symptoms. He denies significant anxiety and uses Xanax 0.5 mg twice a day with good relief   The patient returns after 3 months. He is generally doing okay but is very tired and sometimes unmotivated. He doesn't get good sleep and he has chronic pain. He takes the Xanax at bedtime but it really doesn't help him stay asleep. I suggested we try trazodone. He notes that when he does get a good night's sleep he feels a lot less depressed and much more motivated the next day  Associated Signs/Symptoms:  Depression Symptoms:  depressed mood, insomnia, psychomotor retardation, (Hypo) Manic Symptoms:   Anxiety Symptoms:  Excessive Worry, Psychotic Symptoms:   PTSD Symptoms:   Past Psychiatric History: The patient was hospitalized at behavioral health in 2013. After that he followed up here for a few visits with Dr. walker and Maurice Small  Previous Psychotropic Medications: Yes   Substance Abuse History in the last 12 months:  No.  Consequences of Substance Abuse: NA  Past Medical History:  Past Medical History:  Diagnosis Date  . Bipolar disorder ([place])   . DDD (degenerative disc disease)    . Depression   . Hyperlipidemia   . Insomnia   . Jejunal ulcer   . Leukocytosis 04/12/2016  . Skin rash     Past Surgical History:  Procedure Laterality Date  . BACK SURGERY    . CERVICAL SPINE SURGERY     x3  . CHOLECYSTECTOMY    . COLONOSCOPY  2006  . COLONOSCOPY N/A 06/30/2014   Procedure: COLONOSCOPY;  Surgeon: Rogene Houston, MD;  [facility];  Service: Endoscopy;  Laterality: N/A;  830  . LUMBAR SPINE SURGERY    . UPPER GASTROINTESTINAL ENDOSCOPY      Family Psychiatric History: The patient's sister has a history of anxiety and an uncle has a history of drug and alcohol abuse  Family History:  Family History  Problem Relation Age of Onset  . Hypertension Sister   . Anxiety disorder Sister   . Hypertension Brother   . Hypertension Brother   . Hypertension Brother   . Dementia Mother   . Paranoid behavior Mother   . Dementia Father   . Alcohol abuse Paternal Uncle   . Drug abuse Paternal Uncle   . ADD / ADHD Neg Hx   . Depression Neg Hx   . OCD Neg Hx   . Schizophrenia Neg Hx   . Seizures Neg Hx   . Sexual abuse Neg Hx   . Physical abuse Neg Hx   . Colon cancer Neg Hx     Social History:   Social History   Social History  . Marital status: Married    Spouse name: N/A  . Number of children: N/A  . Years of education: N/A   Occupational History  . [employer] Unemployed   Social History Main Topics  . Smoking status: Current Every Day Smoker    Packs/day: 1.00    Years: 40.00    Types: Cigarettes  . Smokeless tobacco: Current User     Comment: 07-05-2016 Dip  . Alcohol use No     Comment: 07-05-2016 per pt no  . Drug use: No     Comment: 07-05-2016 per pt no and he stopped Marijuana 1 yr ago  . Sexual activity: Not Asked   Other Topics Concern  . None   Social History Narrative   Married with 3 children and lives locally   No regular exercise    Additional Social History: Patient grew up in [place] with 3 brothers and 1 sister  and both parents. He states that his father was strict but there is no history of abuse or trauma. He quit school in the ninth grade to start working. He states that his family was very poor. 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 He has 3 children and 3 grandchildren and he and his wife take care of  the grandchildren while her children work  Allergies:   Allergies  Allergen Reactions  . Elavil [Amitriptyline] Other (See Comments)    Shaky    Metabolic Disorder Labs: No results found for: HGBA1C, MPG No results found for: PROLACTIN Lab Results  Component Value Date   CHOL 272 (H) 07/04/2010   TRIG 706 (H) 07/04/2010   HDL 30 (L) 07/04/2010   CHOLHDL 9.1 Ratio 07/04/2010   VLDL NOT CALC mg/dL 07/04/2010   LDLCALC See Comment mg/dL 07/04/2010   LDLCALC  12/20/2009    UNABLE TO CALCULATE IF TRIGLYCERIDE OVER 400 mg/dL        Total Cholesterol/HDL:CHD Risk Coronary Heart Disease Risk Table                     Men   Women  1/2 Average Risk   3.4   3.3  Average Risk       5.0   4.4  2 X Average Risk   9.6   7.1  3 X Average Risk  23.4   11.0        Use the calculated Patient Ratio above and the CHD Risk Table to determine the patient's CHD Risk.        ATP III CLASSIFICATION (LDL):  <100     mg/dL   Optimal  100-129  mg/dL   Near or Above                    Optimal  130-159  mg/dL   Borderline  160-189  mg/dL   High  >190     mg/dL   Very High     Current Medications: Current Outpatient Prescriptions  Medication Sig Dispense Refill  . ALPRAZolam (XANAX) 0.5 MG tablet Take 1 tablet (0.5 mg total) by mouth 2 (two) times daily. 60 tablet 2  . amLODipine (NORVASC) 5 MG tablet Take 5 mg by mouth at bedtime.    . baclofen (LIORESAL) 10 MG tablet Take 10 mg by mouth daily.    . DULoxetine (CYMBALTA) 60 MG capsule Take 1 capsule (60 mg total) by mouth 2 (two) times daily. 120 capsule 30   . ezetimibe (ZETIA) 10 MG tablet Take 1 tablet by mouth daily.    . fentaNYL (DURAGESIC - DOSED MCG/HR) 50 MCG/HR Apply 50 mcg/hr topically every 3 (three) days.    Marland Kitchen ibuprofen (ADVIL,MOTRIN) 200 MG tablet Take 600 mg by mouth daily as needed for headache or moderate pain.     Marland Kitchen lamoTRIgine (LAMICTAL) 100 MG tablet Taking 0.5 tablets in AM 1 tablet at Night 60 tablet 2  . traZODone (DESYREL) 100 MG tablet Take 1 tablet (100 mg total) by mouth at bedtime. 30 tablet 2   No current facility-administered medications for this visit.     Neurologic: Headache: No Seizure: No Paresthesias:No  Musculoskeletal: Strength & Muscle Tone: decreased Gait & Station: shuffle Patient leans: N/A  Psychiatric Specialty Exam: Review of Systems  Constitutional: Positive for malaise/fatigue.  Musculoskeletal: Positive for back pain and neck pain.  Psychiatric/Behavioral: Positive for depression. The patient is nervous/anxious and has insomnia.   All other systems reviewed and are negative.   Blood pressure 130/76, height 5\' 6"  (1.676 m), weight 236 lb (107 kg).Body mass index is 38.09 kg/m.  General Appearance: Casual and Fairly Groomed  Eye Contact:  Good  Speech:  Clear and Coherent  Volume:  Normal  Mood:  Anxious   Affect:  Congruent  Thought Process:  Goal Directed  Orientation:  Full (Time, Place, and Person)  Thought Content:  WDL  Suicidal Thoughts:  No  Homicidal Thoughts:  No  Memory:  Good   Judgement:  Good  Insight:  Fair  Psychomotor Activity:  Decreased  Concentration:  Concentration: Good and Attention Span: Good  Recall:  Good  Fund of Knowledge:Good  Language: Good  Akathisia:  No  Handed:  Right  AIMS (if indicated):    Assets:  Communication Skills Desire for Improvement Leisure Time Resilience Social Support Talents/Skills  ADL's:  Intact  Cognition: WNL  Sleep:  poor    Treatment Plan Summary: Medication management   The patient will continue Cymbalta  60 g twice a day for depression and pain management, Lamictal 100 mg in the morning and 50 mg in the evening for mood stabilization and Xanax 0.5 mg twice a day for anxiety And start trazodone 100 mg at bedtime for sleep. He'll return to see me in 6 weeks   Levonne Spiller, MD 3/22/20183:18 PMPatient ID: Stephen Porter, male   DOB: 07-07-1956, 29 y.o.   MRN: [id number]

## 2016-12-17 ENCOUNTER — Telehealth (HOSPITAL_COMMUNITY): Payer: Self-pay | Admitting: *Deleted

## 2016-12-17 NOTE — Telephone Encounter (Signed)
phone call from patient.  Trazodone was not received at [facility].  He said Dr. Harrington Challenger was sending it over the other day.

## 2016-12-18 NOTE — Telephone Encounter (Signed)
Called pt due to previous message. Asked pt if he already have his script from pharmacy due to pt chart stating med was sent to pharmacy on 12-13-2016. Per pt he did pick up Trazodone from pharmacy yesterday. Per pt he don't know what happened but pharmacy was apologizing to him for not having his med ready in time but he have his medication.

## 2017-01-02 DIAGNOSIS — F329 Major depressive disorder, single episode, unspecified: Secondary | ICD-10-CM | POA: Diagnosis not present

## 2017-01-02 DIAGNOSIS — I10 Essential (primary) hypertension: Secondary | ICD-10-CM | POA: Diagnosis not present

## 2017-01-02 DIAGNOSIS — Z6831 Body mass index (BMI) 31.0-31.9, adult: Secondary | ICD-10-CM | POA: Diagnosis not present

## 2017-01-02 DIAGNOSIS — K5010 Crohn's disease of large intestine without complications: Secondary | ICD-10-CM | POA: Diagnosis not present

## 2017-01-02 DIAGNOSIS — Z299 Encounter for prophylactic measures, unspecified: Secondary | ICD-10-CM | POA: Diagnosis not present

## 2017-01-02 DIAGNOSIS — R21 Rash and other nonspecific skin eruption: Secondary | ICD-10-CM | POA: Diagnosis not present

## 2017-01-04 DIAGNOSIS — M4722 Other spondylosis with radiculopathy, cervical region: Secondary | ICD-10-CM | POA: Diagnosis not present

## 2017-01-04 DIAGNOSIS — M960 Pseudarthrosis after fusion or arthrodesis: Secondary | ICD-10-CM | POA: Diagnosis not present

## 2017-01-04 DIAGNOSIS — M542 Cervicalgia: Secondary | ICD-10-CM | POA: Diagnosis not present

## 2017-01-04 DIAGNOSIS — G894 Chronic pain syndrome: Secondary | ICD-10-CM | POA: Diagnosis not present

## 2017-01-16 ENCOUNTER — Other Ambulatory Visit (HOSPITAL_COMMUNITY): Payer: Self-pay | Admitting: Orthopedic Surgery

## 2017-01-16 DIAGNOSIS — M960 Pseudarthrosis after fusion or arthrodesis: Secondary | ICD-10-CM

## 2017-01-22 ENCOUNTER — Encounter (HOSPITAL_COMMUNITY): Payer: Self-pay | Admitting: Psychiatry

## 2017-01-22 ENCOUNTER — Ambulatory Visit (INDEPENDENT_AMBULATORY_CARE_PROVIDER_SITE_OTHER): Payer: PPO | Admitting: Psychiatry

## 2017-01-22 VITALS — BP 134/75 | HR 75 | Ht 66.0 in | Wt 239.0 lb

## 2017-01-22 DIAGNOSIS — Z791 Long term (current) use of non-steroidal anti-inflammatories (NSAID): Secondary | ICD-10-CM | POA: Diagnosis not present

## 2017-01-22 DIAGNOSIS — G8929 Other chronic pain: Secondary | ICD-10-CM

## 2017-01-22 DIAGNOSIS — Z818 Family history of other mental and behavioral disorders: Secondary | ICD-10-CM

## 2017-01-22 DIAGNOSIS — Z813 Family history of other psychoactive substance abuse and dependence: Secondary | ICD-10-CM | POA: Diagnosis not present

## 2017-01-22 DIAGNOSIS — F325 Major depressive disorder, single episode, in full remission: Secondary | ICD-10-CM

## 2017-01-22 DIAGNOSIS — Z811 Family history of alcohol abuse and dependence: Secondary | ICD-10-CM

## 2017-01-22 DIAGNOSIS — F17220 Nicotine dependence, chewing tobacco, uncomplicated: Secondary | ICD-10-CM | POA: Diagnosis not present

## 2017-01-22 DIAGNOSIS — Z79899 Other long term (current) drug therapy: Secondary | ICD-10-CM

## 2017-01-22 MED ORDER — LAMOTRIGINE 100 MG PO TABS: ORAL_TABLET | ORAL | 2 refills | Status: DC

## 2017-01-22 MED ORDER — ALPRAZOLAM 0.5 MG PO TABS: 0.5000 mg | ORAL_TABLET | Freq: Two times a day (BID) | ORAL | 2 refills | Status: DC

## 2017-01-22 MED ORDER — TRAZODONE HCL 100 MG PO TABS: 100.0000 mg | ORAL_TABLET | Freq: Every day | ORAL | 2 refills | Status: DC

## 2017-01-22 MED ORDER — ESCITALOPRAM OXALATE 20 MG PO TABS: 20.0000 mg | ORAL_TABLET | Freq: Every day | ORAL | 2 refills | Status: DC

## 2017-01-22 NOTE — Progress Notes (Signed)
Psychiatric Initial Adult Assessment   Patient Identification: Stephen Porter MRN:  [id number] Date of Evaluation:  01/22/2017 Referral Source: Dr. Manuella Ghazi Chief Complaint:   Chief Complaint    Depression; Anxiety; Follow-up     Visit Diagnosis:    ICD-9-CM ICD-10-CM   1. Major depressive disorder with single episode, in full remission ([place]) 296.26 F32.5     History of Present Illness:  This patient is a 61 year old married white male who lives with his wife in [place]. He has 2 grown daughters one son and 3 grandchildren. 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  The patient was referred by his primary physician, Dr. Brigitte Pulse, for further assessment and treatment of depression anxiety and chronic pain.  The patient states that as a younger person he never had any issues with depression. 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 In 2003 he had a neck injury and has had 3 surgeries on it and in 2005 had a back injury and had to have surgery on this as well. He stated that after the back surgery he was in a great deal of pain and dysfunction. He was definitely no longer able to work and he was very frustrated with this. He stated that the back surgeon gave him a high dosage of pain medication and he became addicted. He had to go to [facility] for detox and he claimed he was detoxed without any help with medication. A few months later he had to get on fentanyl patches for the pain.  In 2013 he was hospitalized at behavioral [facility] hospital because he was so depressed and felt like nothing was worth living for. His father was declining as well and died shortly after this. While there he was started on Cymbalta Lamictal and several other medications which he no longer takes. He was followed here for a time by Dr. walker and Maurice Small for counseling. Eventually however  Dr. Manuella Ghazi took over his treatment.  The patient is now on a combination of Cymbalta, low-dose Xanax at bedtime Lamictal. He takes fentanyl patch every 3 days. He still is in a fair amount of pain but doesn't ever want to get back on pain pills. He states that for the most part his mood is pretty good but he is having a great deal of difficulty sleeping. He states it's been like this ever since he got on narcotics. He's developed a trick of taking the patch off before he goes to sleep and then he's able to sleep better. Benadryl helps as well. He still under good amount of pain during the day but tries to keep moving, babysitting his grandchildren. He denies being depressed now or having any thoughts of suicide. His energy is the greatest but he thinks it's because of his poor sleep. He eats well and his appetite is good and he denies any suicidal ideation or psychotic symptoms. He denies significant anxiety and uses Xanax 0.5 mg twice a day with good relief   The patient returns after 3 months. He states that the trazodone helps him sleep but he is been more depressed lately. He is constantly worried about the future and wonders if he'll have Alzheimer's disease like his father did. He states to himself a lot but enjoys his woodworking. He's been trying to read more. He still hasn't found a church that he  feels comfortable with. He states that in the past she did better on Lexapro then he's been doing on the Cymbalta so I think we need to make a switch back. He denies suicidal ideation  Associated Signs/Symptoms: Depression Symptoms:  depressed mood, insomnia, psychomotor retardation, (Hypo) Manic Symptoms:   Anxiety Symptoms:  Excessive Worry, Psychotic Symptoms:   PTSD Symptoms:   Past Psychiatric History: The patient was hospitalized at behavioral health in 2013. After that he followed up here for a few visits with Dr. walker and Maurice Small  Previous Psychotropic Medications: Yes   Substance  Abuse History in the last 12 months:  No.  Consequences of Substance Abuse: NA  Past Medical History:  Past Medical History:  Diagnosis Date  . Bipolar disorder ([place])   . DDD (degenerative disc disease)   . Depression   . Hyperlipidemia   . Insomnia   . Jejunal ulcer   . Leukocytosis 04/12/2016  . Skin rash     Past Surgical History:  Procedure Laterality Date  . BACK SURGERY    . CERVICAL SPINE SURGERY     x3  . CHOLECYSTECTOMY    . COLONOSCOPY  2006  . COLONOSCOPY N/A 06/30/2014   Procedure: COLONOSCOPY;  Surgeon: Rogene Houston, MD;  [facility];  Service: Endoscopy;  Laterality: N/A;  830  . LUMBAR SPINE SURGERY    . UPPER GASTROINTESTINAL ENDOSCOPY      Family Psychiatric History: The patient's sister has a history of anxiety and an uncle has a history of drug and alcohol abuse  Family History:  Family History  Problem Relation Age of Onset  . Hypertension Sister   . Anxiety disorder Sister   . Hypertension Brother   . Hypertension Brother   . Hypertension Brother   . Dementia Mother   . Paranoid behavior Mother   . Dementia Father   . Alcohol abuse Paternal Uncle   . Drug abuse Paternal Uncle   . ADD / ADHD Neg Hx   . Depression Neg Hx   . OCD Neg Hx   . Schizophrenia Neg Hx   . Seizures Neg Hx   . Sexual abuse Neg Hx   . Physical abuse Neg Hx   . Colon cancer Neg Hx     Social History:   Social History   Social History  . Marital status: Married    Spouse name: N/A  . Number of children: N/A  . Years of education: N/A   Occupational History  . [employer] Unemployed   Social History Main Topics  . Smoking status: Former Smoker    Packs/day: 1.00    Years: 40.00    Types: Cigarettes  . Smokeless tobacco: Current User     Comment: 07-05-2016 Dip, 01-22-2017 per pt he stopped Sep 25, 2016  . Alcohol use No     Comment: 07-05-2016 per pt no  . Drug use: No     Comment: 07-05-2016 per pt no and he stopped Marijuana 1 yr ago  .  Sexual activity: Not Asked   Other Topics Concern  . None   Social History Narrative   Married with 3 children and lives locally   No regular exercise    Additional Social History: Patient grew up in [place] with 3 brothers and 1 sister and both parents. He states that his father was strict but there is no history of abuse or trauma. He quit school in the ninth grade to start working. He states that  his family was very poor. 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 He has 3 children and 3 grandchildren and he and his wife take care of the grandchildren while her children work  Allergies:   Allergies  Allergen Reactions  . Elavil [Amitriptyline] Other (See Comments)    Shaky    Metabolic Disorder Labs: No results found for: HGBA1C, MPG No results found for: PROLACTIN Lab Results  Component Value Date   CHOL 272 (H) 07/04/2010   TRIG 706 (H) 07/04/2010   HDL 30 (L) 07/04/2010   CHOLHDL 9.1 Ratio 07/04/2010   VLDL NOT CALC mg/dL 07/04/2010   LDLCALC See Comment mg/dL 07/04/2010   LDLCALC  12/20/2009    UNABLE TO CALCULATE IF TRIGLYCERIDE OVER 400 mg/dL        Total Cholesterol/HDL:CHD Risk Coronary Heart Disease Risk Table                     Men   Women  1/2 Average Risk   3.4   3.3  Average Risk       5.0   4.4  2 X Average Risk   9.6   7.1  3 X Average Risk  23.4   11.0        Use the calculated Patient Ratio above and the CHD Risk Table to determine the patient's CHD Risk.        ATP III CLASSIFICATION (LDL):  <100     mg/dL   Optimal  100-129  mg/dL   Near or Above                    Optimal  130-159  mg/dL   Borderline  160-189  mg/dL   High  >190     mg/dL   Very High     Current Medications: Current Outpatient Prescriptions  Medication Sig Dispense Refill  . ALPRAZolam (XANAX) 0.5 MG tablet Take 1 tablet (0.5 mg total) by mouth 2 (two) times daily. 60 tablet 2  .  amLODipine (NORVASC) 5 MG tablet Take 5 mg by mouth at bedtime.    Marland Kitchen ezetimibe (ZETIA) 10 MG tablet Take 1 tablet by mouth daily.    . fentaNYL (DURAGESIC - DOSED MCG/HR) 50 MCG/HR Apply 50 mcg/hr topically every 3 (three) days.    Marland Kitchen ibuprofen (ADVIL,MOTRIN) 200 MG tablet Take 600 mg by mouth daily as needed for headache or moderate pain.     Marland Kitchen lamoTRIgine (LAMICTAL) 100 MG tablet Taking 0.5 tablets in AM 1 tablet at Night 60 tablet 2  . traZODone (DESYREL) 100 MG tablet Take 1 tablet (100 mg total) by mouth at bedtime. 30 tablet 2  . baclofen (LIORESAL) 10 MG tablet Take 10 mg by mouth daily.    Marland Kitchen escitalopram (LEXAPRO) 20 MG tablet Take 1 tablet (20 mg total) by mouth daily. 30 tablet 2   No current facility-administered medications for this visit.     Neurologic: Headache: No Seizure: No Paresthesias:No  Musculoskeletal: Strength & Muscle Tone: decreased Gait & Station: shuffle Patient leans: N/A  Psychiatric Specialty Exam: Review of Systems  Constitutional: Positive for malaise/fatigue.  Musculoskeletal: Positive for back pain and neck pain.  Psychiatric/Behavioral: Positive for depression. The patient is nervous/anxious and has insomnia.   All other systems reviewed and are negative.   Blood pressure 134/75, pulse 75, height 5\' 6"  (1.676 m), weight 239  lb (108.4 kg).Body mass index is 38.58 kg/m.  General Appearance: Casual and Fairly Groomed  Eye Contact:  Good  Speech:  Clear and Coherent  Volume:  Normal  Mood:  Anxious And somewhat depressed   Affect:  Congruent  Thought Process:  Goal Directed  Orientation:  Full (Time, Place, and Person)  Thought Content:  WDL  Suicidal Thoughts:  No  Homicidal Thoughts:  No  Memory:  Good   Judgement:  Good  Insight:  Fair  Psychomotor Activity:  Decreased  Concentration:  Concentration: Good and Attention Span: Good  Recall:  Good  Fund of Knowledge:Good  Language: Good  Akathisia:  No  Handed:  Right  AIMS (if  indicated):    Assets:  Communication Skills Desire for Improvement Leisure Time Resilience Social Support Talents/Skills  ADL's:  Intact  Cognition: WNL  Sleep:  poor    Treatment Plan Summary: Medication management   The patient will Discontinue Cymbalta and start Lexapro 20 mg daily. He will continue Lamictal 100 mg in the morning and 50 mg in the evening for mood stabilization and Xanax 0.5 mg twice a day for anxiety . He will continue trazodone 100 mg at bedtime for sleep. He'll return to see me in 6 weeks   Levonne Spiller, MD 5/1/20183:09 PMPatient ID: Stephen Porter, male   DOB: 1955/11/18, 59 y.o.   MRN: [id number]

## 2017-01-23 ENCOUNTER — Ambulatory Visit (HOSPITAL_COMMUNITY)
Admission: RE | Admit: 2017-01-23 | Discharge: 2017-01-23 | Disposition: A | Discharge status: Home / Self Care | Payer: PPO | Source: Ambulatory Visit | Attending: Orthopedic Surgery | Admitting: Orthopedic Surgery

## 2017-01-23 DIAGNOSIS — M960 Pseudarthrosis after fusion or arthrodesis: Secondary | ICD-10-CM | POA: Insufficient documentation

## 2017-01-23 DIAGNOSIS — M4802 Spinal stenosis, cervical region: Secondary | ICD-10-CM | POA: Diagnosis not present

## 2017-01-23 DIAGNOSIS — M50221 Other cervical disc displacement at C4-C5 level: Secondary | ICD-10-CM | POA: Diagnosis not present

## 2017-01-23 DIAGNOSIS — M47812 Spondylosis without myelopathy or radiculopathy, cervical region: Secondary | ICD-10-CM | POA: Diagnosis not present

## 2017-01-23 DIAGNOSIS — M542 Cervicalgia: Secondary | ICD-10-CM | POA: Diagnosis not present

## 2017-01-25 DIAGNOSIS — M960 Pseudarthrosis after fusion or arthrodesis: Secondary | ICD-10-CM | POA: Diagnosis not present

## 2017-01-25 DIAGNOSIS — M961 Postlaminectomy syndrome, not elsewhere classified: Secondary | ICD-10-CM | POA: Diagnosis not present

## 2017-01-25 DIAGNOSIS — M4722 Other spondylosis with radiculopathy, cervical region: Secondary | ICD-10-CM | POA: Diagnosis not present

## 2017-01-25 DIAGNOSIS — G894 Chronic pain syndrome: Secondary | ICD-10-CM | POA: Diagnosis not present

## 2017-02-05 DIAGNOSIS — M542 Cervicalgia: Secondary | ICD-10-CM | POA: Diagnosis not present

## 2017-02-05 DIAGNOSIS — F329 Major depressive disorder, single episode, unspecified: Secondary | ICD-10-CM | POA: Diagnosis not present

## 2017-02-05 DIAGNOSIS — I10 Essential (primary) hypertension: Secondary | ICD-10-CM | POA: Diagnosis not present

## 2017-02-05 DIAGNOSIS — Z6832 Body mass index (BMI) 32.0-32.9, adult: Secondary | ICD-10-CM | POA: Diagnosis not present

## 2017-02-05 DIAGNOSIS — Z299 Encounter for prophylactic measures, unspecified: Secondary | ICD-10-CM | POA: Diagnosis not present

## 2017-02-05 DIAGNOSIS — J984 Other disorders of lung: Secondary | ICD-10-CM | POA: Diagnosis not present

## 2017-02-05 DIAGNOSIS — E7800 Pure hypercholesterolemia, unspecified: Secondary | ICD-10-CM | POA: Diagnosis not present

## 2017-02-05 DIAGNOSIS — G8929 Other chronic pain: Secondary | ICD-10-CM | POA: Diagnosis not present

## 2017-02-05 DIAGNOSIS — K219 Gastro-esophageal reflux disease without esophagitis: Secondary | ICD-10-CM | POA: Diagnosis not present

## 2017-02-05 DIAGNOSIS — F1120 Opioid dependence, uncomplicated: Secondary | ICD-10-CM | POA: Diagnosis not present

## 2017-02-05 DIAGNOSIS — K5190 Ulcerative colitis, unspecified, without complications: Secondary | ICD-10-CM | POA: Diagnosis not present

## 2017-02-05 DIAGNOSIS — K5010 Crohn's disease of large intestine without complications: Secondary | ICD-10-CM | POA: Diagnosis not present

## 2017-03-04 ENCOUNTER — Ambulatory Visit (INDEPENDENT_AMBULATORY_CARE_PROVIDER_SITE_OTHER): Payer: PPO | Admitting: Psychiatry

## 2017-03-04 ENCOUNTER — Encounter (HOSPITAL_COMMUNITY): Payer: Self-pay | Admitting: Psychiatry

## 2017-03-04 VITALS — BP 118/80 | HR 82 | Ht 66.0 in | Wt 226.0 lb

## 2017-03-04 DIAGNOSIS — Z814 Family history of other substance abuse and dependence: Secondary | ICD-10-CM

## 2017-03-04 DIAGNOSIS — Z818 Family history of other mental and behavioral disorders: Secondary | ICD-10-CM | POA: Diagnosis not present

## 2017-03-04 DIAGNOSIS — F325 Major depressive disorder, single episode, in full remission: Secondary | ICD-10-CM

## 2017-03-04 DIAGNOSIS — Z888 Allergy status to other drugs, medicaments and biological substances status: Secondary | ICD-10-CM | POA: Diagnosis not present

## 2017-03-04 DIAGNOSIS — Z791 Long term (current) use of non-steroidal anti-inflammatories (NSAID): Secondary | ICD-10-CM

## 2017-03-04 DIAGNOSIS — Z79899 Other long term (current) drug therapy: Secondary | ICD-10-CM

## 2017-03-04 DIAGNOSIS — Z87891 Personal history of nicotine dependence: Secondary | ICD-10-CM

## 2017-03-04 DIAGNOSIS — Z811 Family history of alcohol abuse and dependence: Secondary | ICD-10-CM | POA: Diagnosis not present

## 2017-03-04 MED ORDER — LAMOTRIGINE 100 MG PO TABS: ORAL_TABLET | ORAL | 2 refills | Status: DC

## 2017-03-04 MED ORDER — ALPRAZOLAM 0.5 MG PO TABS: 0.5000 mg | ORAL_TABLET | Freq: Two times a day (BID) | ORAL | 2 refills | Status: DC

## 2017-03-04 MED ORDER — FLUOXETINE HCL 20 MG PO CAPS: 20.0000 mg | ORAL_CAPSULE | ORAL | 2 refills | Status: DC

## 2017-03-04 MED ORDER — TRAZODONE HCL 100 MG PO TABS: 100.0000 mg | ORAL_TABLET | Freq: Every day | ORAL | 2 refills | Status: DC

## 2017-03-04 NOTE — Progress Notes (Signed)
Psychiatric Initial Adult Assessment   Patient Identification: Stephen Porter MRN:  [id number] Date of Evaluation:  03/04/2017 Referral Source: Dr. Manuella Ghazi Chief Complaint:   Chief Complaint    Depression; Anxiety; Follow-up     Visit Diagnosis:    ICD-10-CM   1. Major depressive disorder with single episode, in full remission ([place]) F32.5     History of Present Illness:  This patient is a 61 year old married white male who lives with his wife in [place]. He has 2 grown daughters one son and 3 grandchildren. 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  The patient was referred by his primary physician, Dr. Brigitte Pulse, for further assessment and treatment of depression anxiety and chronic pain.  The patient states that as a younger person he never had any issues with depression. 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 In 2003 he had a neck injury and has had 3 surgeries on it and in 2005 had a back injury and had to have surgery on this as well. He stated that after the back surgery he was in a great deal of pain and dysfunction. He was definitely no longer able to work and he was very frustrated with this. He stated that the back surgeon gave him a high dosage of pain medication and he became addicted. He had to go to [facility] for detox and he claimed he was detoxed without any help with medication. A few months later he had to get on fentanyl patches for the pain.  In 2013 he was hospitalized at behavioral [facility] because he was so depressed and felt like nothing was worth living for. His father was declining as well and died shortly after this. While there he was started on Cymbalta Lamictal and several other medications which he no longer takes. He was followed here for a time by Dr. walker and Maurice Small for counseling. Eventually however Dr. Manuella Ghazi took  over his treatment.  The patient is now on a combination of Cymbalta, low-dose Xanax at bedtime Lamictal. He takes fentanyl patch every 3 days. He still is in a fair amount of pain but doesn't ever want to get back on pain pills. He states that for the most part his mood is pretty good but he is having a great deal of difficulty sleeping. He states it's been like this ever since he got on narcotics. He's developed a trick of taking the patch off before he goes to sleep and then he's able to sleep better. Benadryl helps as well. He still under good amount of pain during the day but tries to keep moving, babysitting his grandchildren. He denies being depressed now or having any thoughts of suicide. His energy is the greatest but he thinks it's because of his poor sleep. He eats well and his appetite is good and he denies any suicidal ideation or psychotic symptoms. He denies significant anxiety and uses Xanax 0.5 mg twice a day with good relief   The patient returns after  6 weeks. Last time we switched him from Cymbalta back to Lexapro but it really hasn't helped. He feels down and tired has no energy or motivation. He admits that his home life is not going well. His wife has been abusing alcohol more and more and now their kids don't want to bring the grandchildren over and he really  misses his grandson. He admits that his wife has a hoarding problem and she also used to abuse pain medications. She refuses to get therapy or make any changes. I suggested that he follow his own interests such as woodworking and try to do things that he enjoys. We can also switch him to Prozac for increased energy. He agrees to counseling here as well  Associated Signs/Symptoms: Depression Symptoms:  depressed mood, insomnia, psychomotor retardation, (Hypo) Manic Symptoms:   Anxiety Symptoms:  Excessive Worry, Psychotic Symptoms:   PTSD Symptoms:   Past Psychiatric History: The patient was hospitalized at behavioral  health in 2013. After that he followed up here for a few visits with Dr. walker and Maurice Small  Previous Psychotropic Medications: Yes   Substance Abuse History in the last 12 months:  No.  Consequences of Substance Abuse: NA  Past Medical History:  Past Medical History:  Diagnosis Date  . Bipolar disorder ([place])   . DDD (degenerative disc disease)   . Depression   . Hyperlipidemia   . Insomnia   . Jejunal ulcer   . Leukocytosis 04/12/2016  . Skin rash     Past Surgical History:  Procedure Laterality Date  . BACK SURGERY    . CERVICAL SPINE SURGERY     x3  . CHOLECYSTECTOMY    . COLONOSCOPY  2006  . COLONOSCOPY N/A 06/30/2014   Procedure: COLONOSCOPY;  Surgeon: Rogene Houston, MD;  Location: [facility];  Service: Endoscopy;  Laterality: N/A;  830  . LUMBAR SPINE SURGERY    . UPPER GASTROINTESTINAL ENDOSCOPY      Family Psychiatric History: The patient's sister has a history of anxiety and an uncle has a history of drug and alcohol abuse  Family History:  Family History  Problem Relation Age of Onset  . Hypertension Sister   . Anxiety disorder Sister   . Hypertension Brother   . Hypertension Brother   . Hypertension Brother   . Dementia Mother   . Paranoid behavior Mother   . Dementia Father   . Alcohol abuse Paternal Uncle   . Drug abuse Paternal Uncle   . ADD / ADHD Neg Hx   . Depression Neg Hx   . OCD Neg Hx   . Schizophrenia Neg Hx   . Seizures Neg Hx   . Sexual abuse Neg Hx   . Physical abuse Neg Hx   . Colon cancer Neg Hx     Social History:   Social History   Social History  . Marital status: Married    Spouse name: N/A  . Number of children: N/A  . Years of education: N/A   Occupational History  . Diasbled Unemployed   Social History Main Topics  . Smoking status: Former Smoker    Packs/day: 1.00    Years: 40.00    Types: Cigarettes  . Smokeless tobacco: Current User     Comment: 07-05-2016 Dip, 01-22-2017 per pt he stopped Sep 25, 2016  . Alcohol use No     Comment: 07-05-2016 per pt no  . Drug use: No     Comment: 07-05-2016 per pt no and he stopped Marijuana 1 yr ago  . Sexual activity: Not Asked   Other Topics Concern  . None   Social History Narrative   Married with 3 children and lives locally   No regular exercise    Additional Social History: Patient grew up in [place] with 3 brothers and 1 sister and both  parents. He states that his father was strict but there is no history of abuse or trauma. He quit school in the ninth grade to start working. He states that his family was very poor. 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 He has 3 children and 3 grandchildren and he and his wife take care of the grandchildren while her children work  Allergies:   Allergies  Allergen Reactions  . Elavil [Amitriptyline] Other (See Comments)    Shaky    Metabolic Disorder Labs: No results found for: HGBA1C, MPG No results found for: PROLACTIN Lab Results  Component Value Date   CHOL 272 (H) 07/04/2010   TRIG 706 (H) 07/04/2010   HDL 30 (L) 07/04/2010   CHOLHDL 9.1 Ratio 07/04/2010   VLDL NOT CALC mg/dL 07/04/2010   LDLCALC See Comment mg/dL 07/04/2010   LDLCALC  12/20/2009    UNABLE TO CALCULATE IF TRIGLYCERIDE OVER 400 mg/dL        Total Cholesterol/HDL:CHD Risk Coronary Heart Disease Risk Table                     Men   Women  1/2 Average Risk   3.4   3.3  Average Risk       5.0   4.4  2 X Average Risk   9.6   7.1  3 X Average Risk  23.4   11.0        Use the calculated Patient Ratio above and the CHD Risk Table to determine the patient's CHD Risk.        ATP III CLASSIFICATION (LDL):  <100     mg/dL   Optimal  100-129  mg/dL   Near or Above                    Optimal  130-159  mg/dL   Borderline  160-189  mg/dL   High  >190     mg/dL   Very High     Current Medications: Current Outpatient  Prescriptions  Medication Sig Dispense Refill  . ALPRAZolam (XANAX) 0.5 MG tablet Take 1 tablet (0.5 mg total) by mouth 2 (two) times daily. 60 tablet 2  . amLODipine (NORVASC) 5 MG tablet Take 5 mg by mouth at bedtime.    . baclofen (LIORESAL) 10 MG tablet Take 10 mg by mouth daily.    Marland Kitchen ezetimibe (ZETIA) 10 MG tablet Take 1 tablet by mouth daily.    . fentaNYL (DURAGESIC - DOSED MCG/HR) 50 MCG/HR Apply 50 mcg/hr topically every 3 (three) days.    Marland Kitchen ibuprofen (ADVIL,MOTRIN) 200 MG tablet Take 600 mg by mouth daily as needed for headache or moderate pain.     Marland Kitchen lamoTRIgine (LAMICTAL) 100 MG tablet Taking 0.5 tablets in AM 1 tablet at Night 60 tablet 2  . traZODone (DESYREL) 100 MG tablet Take 1 tablet (100 mg total) by mouth at bedtime. 30 tablet 2  . FLUoxetine (PROZAC) 20 MG capsule Take 1 capsule (20 mg total) by mouth every morning. 30 capsule 2   No current facility-administered medications for this visit.     Neurologic: Headache: No Seizure: No Paresthesias:No  Musculoskeletal: Strength & Muscle Tone: decreased Gait & Station: shuffle Patient leans: N/A  Psychiatric Specialty Exam: Review of Systems  Constitutional: Positive for malaise/fatigue.  Musculoskeletal: Positive for back pain and neck pain.  Psychiatric/Behavioral: Positive for  depression. The patient is nervous/anxious and has insomnia.   All other systems reviewed and are negative.   Blood pressure 118/80, pulse 82, height 5\' 6"  (1.676 m), weight 226 lb (102.5 kg), SpO2 95 %.Body mass index is 36.48 kg/m.  General Appearance: Casual and Fairly Groomed  Eye Contact:  Good  Speech:  Clear and Coherent  Volume:  Normal  Mood:  Depressed   Affect:  Dysphoric   Thought Process:  Goal Directed  Orientation:  Full (Time, Place, and Person)  Thought Content:  WDL  Suicidal Thoughts:  No  Homicidal Thoughts:  No  Memory:  Good   Judgement:  Good  Insight:  Fair  Psychomotor Activity:  Decreased   Concentration:  Concentration: Good and Attention Span: Good  Recall:  Good  Fund of Knowledge:Good  Language: Good  Akathisia:  No  Handed:  Right  AIMS (if indicated):    Assets:  Communication Skills Desire for Improvement Leisure Time Resilience Social Support Talents/Skills  ADL's:  Intact  Cognition: WNL  Sleep:  poor    Treatment Plan Summary: Medication management   The patient will Discontinue Lexapro and start Prozac 20 mg daily He will continue Lamictal 100 mg in the morning and 50 mg in the evening for mood stabilization and Xanax 0.5 mg twice a day for anxiety . He will continue trazodone 100 mg at bedtime for sleep. He'll start counseling here and return to see me in 4 weeks   Levonne Spiller, MD 6/11/20182:23 PMPatient ID: Stephen Porter, male   DOB: Feb 01, 1956, 61 y.o.   MRN: [id number]

## 2017-03-12 ENCOUNTER — Encounter (HOSPITAL_COMMUNITY): Payer: Self-pay | Admitting: Licensed Clinical Social Worker

## 2017-03-12 ENCOUNTER — Ambulatory Visit (INDEPENDENT_AMBULATORY_CARE_PROVIDER_SITE_OTHER): Payer: PPO | Admitting: Licensed Clinical Social Worker

## 2017-03-12 DIAGNOSIS — F0632 Mood disorder due to known physiological condition with major depressive-like episode: Secondary | ICD-10-CM
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
## 2017-03-15 ENCOUNTER — Other Ambulatory Visit (HOSPITAL_COMMUNITY): Payer: Self-pay | Admitting: Psychiatry

## 2017-04-03 ENCOUNTER — Ambulatory Visit (INDEPENDENT_AMBULATORY_CARE_PROVIDER_SITE_OTHER): Payer: PPO | Admitting: Psychiatry

## 2017-04-03 ENCOUNTER — Encounter (HOSPITAL_COMMUNITY): Payer: Self-pay | Admitting: Psychiatry

## 2017-04-03 VITALS — BP 142/84 | HR 88 | Resp 15 | Wt 227.2 lb

## 2017-04-03 DIAGNOSIS — Z87891 Personal history of nicotine dependence: Secondary | ICD-10-CM

## 2017-04-03 DIAGNOSIS — G4700 Insomnia, unspecified: Secondary | ICD-10-CM

## 2017-04-03 DIAGNOSIS — F0632 Mood disorder due to known physiological condition with major depressive-like episode: Secondary | ICD-10-CM | POA: Diagnosis not present

## 2017-04-03 DIAGNOSIS — Z810 Family history of intellectual disabilities: Secondary | ICD-10-CM | POA: Diagnosis not present

## 2017-04-03 DIAGNOSIS — Z811 Family history of alcohol abuse and dependence: Secondary | ICD-10-CM | POA: Diagnosis not present

## 2017-04-03 MED ORDER — LAMOTRIGINE 100 MG PO TABS: ORAL_TABLET | ORAL | 2 refills | Status: DC

## 2017-04-03 MED ORDER — TRAZODONE HCL 100 MG PO TABS: 100.0000 mg | ORAL_TABLET | Freq: Every day | ORAL | 2 refills | Status: DC

## 2017-04-03 MED ORDER — ALPRAZOLAM 0.5 MG PO TABS: 0.5000 mg | ORAL_TABLET | Freq: Three times a day (TID) | ORAL | 2 refills | Status: DC

## 2017-04-03 MED ORDER — FLUOXETINE HCL 20 MG PO CAPS: 20.0000 mg | ORAL_CAPSULE | ORAL | 2 refills | Status: DC
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
## 2017-04-11 ENCOUNTER — Ambulatory Visit (INDEPENDENT_AMBULATORY_CARE_PROVIDER_SITE_OTHER): Payer: PPO | Admitting: Licensed Clinical Social Worker

## 2017-04-11 ENCOUNTER — Encounter (HOSPITAL_COMMUNITY): Payer: Self-pay | Admitting: Licensed Clinical Social Worker

## 2017-04-11 DIAGNOSIS — F1120 Opioid dependence, uncomplicated: Secondary | ICD-10-CM | POA: Diagnosis not present

## 2017-04-11 DIAGNOSIS — Z6831 Body mass index (BMI) 31.0-31.9, adult: Secondary | ICD-10-CM | POA: Diagnosis not present

## 2017-04-11 DIAGNOSIS — K5010 Crohn's disease of large intestine without complications: Secondary | ICD-10-CM | POA: Diagnosis not present

## 2017-04-11 DIAGNOSIS — G8929 Other chronic pain: Secondary | ICD-10-CM | POA: Diagnosis not present

## 2017-04-11 DIAGNOSIS — F329 Major depressive disorder, single episode, unspecified: Secondary | ICD-10-CM | POA: Diagnosis not present

## 2017-04-11 DIAGNOSIS — K5190 Ulcerative colitis, unspecified, without complications: Secondary | ICD-10-CM | POA: Diagnosis not present

## 2017-04-11 DIAGNOSIS — F0632 Mood disorder due to known physiological condition with major depressive-like episode: Secondary | ICD-10-CM

## 2017-04-11 DIAGNOSIS — I10 Essential (primary) hypertension: Secondary | ICD-10-CM | POA: Diagnosis not present

## 2017-04-11 DIAGNOSIS — F17210 Nicotine dependence, cigarettes, uncomplicated: Secondary | ICD-10-CM | POA: Diagnosis not present

## 2017-04-11 DIAGNOSIS — Z299 Encounter for prophylactic measures, unspecified: Secondary | ICD-10-CM | POA: Diagnosis not present

## 2017-04-11 DIAGNOSIS — E7800 Pure hypercholesterolemia, unspecified: Secondary | ICD-10-CM | POA: Diagnosis not present

## 2017-04-11 NOTE — Progress Notes (Signed)
   THERAPIST PROGRESS NOTE  Session Time: 1:00 pm-1:30 pm  Participation Level: Active  Behavioral Response: CasualAlertEuthymic  Type of Therapy: Individual Therapy  Treatment Goals addressed: Coping  Interventions: CBT and Solution Focused  Summary: Stephen Porter is a 61 y.o. male who presents oriented x5 (person, place, situation, time and object), alert, well groomed, causual clothing, and cooperative to address depressive symptoms. Patient denies mania. Patient denies suicidal and homicidal ideations. He denies psychosis including auditory and visual hallucinations. Patient denies current substance abuse but admits to a past history of pain medication addiction that he sought help to detox from. Patient is at no risk for lethality. Patient has a previous diagnosis of Major Depressive Disorder, with single episode in remission. This diagnosis will be discontinued. Patient is experiencing symptoms of depression as a result from his health issues and chronic pain (lack of motivation, low energy, irritability, feeling down).   Patient had an average score of  8 out of 10 on the Outcome Rating Scale. Patient reported that he has a new grandson that is 15 days old which has added to his improved mood. Patient reported that he has not had any symptoms of depression or anxiety. Patient reported that he has been balancing staying active vs. Overdoing it. Patient explained that he has managed and improved his mood by expressing gratitude daily, accepting where he is in life and his chronic pain, remains positive and staying active. Patient committed to continue to express gratitude, practice acceptance, remain positive and stay active.  Patient rated the session 9 out of 10 on the Session Rating Scale.   Patient engaged in session. He responded well to interventions. Patient continues to meet criteria for Depressive disorder due to another medical condition with major depressive-like episode. Patient  will continue in outpatient therapy due to being the least restrictive service to meet his needs. Patient made moderate progress on his goals.   Suicidal/Homicidal: Negativewithout intent/plan  Therapist Response: Therapist reviewed patient's recent thoughts and behaviors. Therapist had patient identify what has gone well since his last session. Therapist had patient identify what has caused an improvement in his mood. Therapist committed patient to continue to express gratitude daily, practice acceptance, remain positive and stay active without overdoing it. Therapist administered the Outcome Rating Scale and the Session Rating Scale.  Plan: Return again in 16 weeks.  Diagnosis: Axis I: Depressive disorder due to another medical condition with major depressive-like episode    Axis II: No diagnosis    Glori Bickers, LCSW 04/11/2017

## 2017-05-26 ENCOUNTER — Other Ambulatory Visit (HOSPITAL_COMMUNITY): Payer: Self-pay | Admitting: Psychiatry

## 2017-06-04 ENCOUNTER — Ambulatory Visit (INDEPENDENT_AMBULATORY_CARE_PROVIDER_SITE_OTHER): Payer: PPO | Admitting: Psychiatry

## 2017-06-04 ENCOUNTER — Encounter (HOSPITAL_COMMUNITY): Payer: Self-pay | Admitting: Psychiatry

## 2017-06-04 VITALS — BP 120/80 | HR 70 | Ht 66.0 in | Wt 220.0 lb

## 2017-06-04 DIAGNOSIS — G4700 Insomnia, unspecified: Secondary | ICD-10-CM

## 2017-06-04 DIAGNOSIS — Z811 Family history of alcohol abuse and dependence: Secondary | ICD-10-CM | POA: Diagnosis not present

## 2017-06-04 DIAGNOSIS — Z810 Family history of intellectual disabilities: Secondary | ICD-10-CM | POA: Diagnosis not present

## 2017-06-04 DIAGNOSIS — F0632 Mood disorder due to known physiological condition with major depressive-like episode: Secondary | ICD-10-CM | POA: Diagnosis not present

## 2017-06-04 DIAGNOSIS — Z818 Family history of other mental and behavioral disorders: Secondary | ICD-10-CM

## 2017-06-04 DIAGNOSIS — Z560 Unemployment, unspecified: Secondary | ICD-10-CM | POA: Diagnosis not present

## 2017-06-04 DIAGNOSIS — M549 Dorsalgia, unspecified: Secondary | ICD-10-CM | POA: Diagnosis not present

## 2017-06-04 DIAGNOSIS — Z87891 Personal history of nicotine dependence: Secondary | ICD-10-CM | POA: Diagnosis not present

## 2017-06-04 DIAGNOSIS — F419 Anxiety disorder, unspecified: Secondary | ICD-10-CM

## 2017-06-04 DIAGNOSIS — R5381 Other malaise: Secondary | ICD-10-CM | POA: Diagnosis not present

## 2017-06-04 DIAGNOSIS — R5383 Other fatigue: Secondary | ICD-10-CM | POA: Diagnosis not present

## 2017-06-04 DIAGNOSIS — Z736 Limitation of activities due to disability: Secondary | ICD-10-CM

## 2017-06-04 DIAGNOSIS — M542 Cervicalgia: Secondary | ICD-10-CM | POA: Diagnosis not present

## 2017-06-04 DIAGNOSIS — Z813 Family history of other psychoactive substance abuse and dependence: Secondary | ICD-10-CM | POA: Diagnosis not present

## 2017-06-04 DIAGNOSIS — F332 Major depressive disorder, recurrent severe without psychotic features: Secondary | ICD-10-CM

## 2017-06-04 DIAGNOSIS — R450 Nervousness: Secondary | ICD-10-CM

## 2017-06-04 MED ORDER — LAMOTRIGINE 100 MG PO TABS: ORAL_TABLET | ORAL | 2 refills | Status: DC

## 2017-06-04 MED ORDER — TRAZODONE HCL 100 MG PO TABS: 100.0000 mg | ORAL_TABLET | Freq: Every day | ORAL | 2 refills | Status: DC

## 2017-06-04 MED ORDER — ALPRAZOLAM 0.5 MG PO TABS: 0.5000 mg | ORAL_TABLET | Freq: Three times a day (TID) | ORAL | 2 refills | Status: DC

## 2017-06-04 MED ORDER — FLUOXETINE HCL 20 MG PO CAPS: 20.0000 mg | ORAL_CAPSULE | ORAL | 2 refills | Status: DC

## 2017-06-04 NOTE — Progress Notes (Signed)
Psychiatric Initial Adult Assessment   Patient Identification: Stephen Porter MRN:  [id number] Date of Evaluation:  06/04/2017 Referral Source: Dr. Manuella Ghazi Chief Complaint:   Chief Complaint    Depression; Anxiety; Follow-up     Visit Diagnosis:    ICD-10-CM   1. Depressive disorder due to another medical condition with major depressive-like episode F06.32     History of Present Illness:  This patient is a 61 year old married white male who lives with his wife in [place]. He has 2 grown daughters one son and 3 grandchildren. 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  The patient was referred by his primary physician, Dr. Brigitte Pulse, for further assessment and treatment of depression anxiety and chronic pain.  The patient states that as a younger person he never had any issues with depression. 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 In 2003 he had a neck injury and has had 3 surgeries on it and in 2005 had a back injury and had to have surgery on this as well. He stated that after the back surgery he was in a great deal of pain and dysfunction. He was definitely no longer able to work and he was very frustrated with this. He stated that the back surgeon gave him a high dosage of pain medication and he became addicted. He had to go to [facility] for detox and he claimed he was detoxed without any help with medication. A few months later he had to get on fentanyl patches for the pain.  In 2013 he was hospitalized at [facility] because he was so depressed and felt like nothing was worth living for. His father was declining as well and died shortly after this. While there he was started on Cymbalta Lamictal and several other medications which he no longer takes. He was followed here for a time by Dr. walker and Maurice Small for counseling. Eventually  however Dr. Manuella Ghazi took over his treatment.  The patient is now on a combination of Cymbalta, low-dose Xanax at bedtime Lamictal. He takes fentanyl patch every 3 days. He still is in a fair amount of pain but doesn't ever want to get back on pain pills. He states that for the most part his mood is pretty good but he is having a great deal of difficulty sleeping. He states it's been like this ever since he got on narcotics. He's developed a trick of taking the patch off before he goes to sleep and then he's able to sleep better. Benadryl helps as well. He still under good amount of pain during the day but tries to keep moving, babysitting his grandchildren. He denies being depressed now or having any thoughts of suicide. His energy is the greatest but he thinks it's because of his poor sleep. He eats well and his appetite is good and he denies any suicidal ideation or psychotic symptoms. He denies significant anxiety and uses Xanax 0.5 mg twice a day with good relief   The patient returns after 2 months. For the most part he is doing very well. His mood has been proved since he got on Prozac. He still has a lot of chronic pain in his neck and shoulders but he uses his fentanyl patch. He is sleeping fairly well. He doesn't use the trazodone every day. He states that he and his wife are  getting along better  Associated Signs/Symptoms: Depression Symptoms:  depressed mood, insomnia, psychomotor retardation, (Hypo) Manic Symptoms:   Anxiety Symptoms:  Excessive Worry, Psychotic Symptoms:   PTSD Symptoms:   Past Psychiatric History: The patient was hospitalized at behavioral health in 2013. After that he followed up here for a few visits with Dr. walker and Maurice Small  Previous Psychotropic Medications: Yes   Substance Abuse History in the last 12 months:  No.  Consequences of Substance Abuse: NA  Past Medical History:  Past Medical History:  Diagnosis Date  . Bipolar disorder ([place])   . DDD  (degenerative disc disease)   . Depression   . Hyperlipidemia   . Insomnia   . Jejunal ulcer   . Leukocytosis 04/12/2016  . Skin rash     Past Surgical History:  Procedure Laterality Date  . BACK SURGERY    . CERVICAL SPINE SURGERY     x3  . CHOLECYSTECTOMY    . COLONOSCOPY  2006  . COLONOSCOPY N/A 06/30/2014   Procedure: COLONOSCOPY;  Surgeon: Rogene Houston, MD;  Location[facility];  Service: Endoscopy;  Laterality: N/A;  830  . LUMBAR SPINE SURGERY    . UPPER GASTROINTESTINAL ENDOSCOPY      Family Psychiatric History: The patient's sister has a history of anxiety and an uncle has a history of drug and alcohol abuse  Family History:  Family History  Problem Relation Age of Onset  . Hypertension Sister   . Anxiety disorder Sister   . Hypertension Brother   . Hypertension Brother   . Hypertension Brother   . Dementia Mother   . Paranoid behavior Mother   . Dementia Father   . Alcohol abuse Paternal Uncle   . Drug abuse Paternal Uncle   . ADD / ADHD Neg Hx   . Depression Neg Hx   . OCD Neg Hx   . Schizophrenia Neg Hx   . Seizures Neg Hx   . Sexual abuse Neg Hx   . Physical abuse Neg Hx   . Colon cancer Neg Hx     Social History:   Social History   Social History  . Marital status: Married    Spouse name: N/A  . Number of children: N/A  . Years of education: N/A   Occupational History  . [employer] Unemployed   Social History Main Topics  . Smoking status: Former Smoker    Packs/day: 1.00    Years: 40.00    Types: Cigarettes  . Smokeless tobacco: Current User     Comment: 07-05-2016 Dip, 01-22-2017 per pt he stopped Sep 25, 2016  . Alcohol use No     Comment: 07-05-2016 per pt no  . Drug use: No     Comment: 07-05-2016 per pt no and he stopped Marijuana 1 yr ago  . Sexual activity: Not Asked   Other Topics Concern  . None   Social History Narrative   Married with 3 children and lives locally   No regular exercise    Additional Social  History: Patient grew up in [place] with 3 brothers and 1 sister and both parents. He states that his father was strict but there is no history of abuse or trauma. He quit school in the ninth grade to start working. He states that his family was very poor. 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 He has 3  children and 3 grandchildren and he and his wife take care of the grandchildren while her children work  Allergies:   Allergies  Allergen Reactions  . Elavil [Amitriptyline] Other (See Comments)    Shaky    Metabolic Disorder Labs: No results found for: HGBA1C, MPG No results found for: PROLACTIN Lab Results  Component Value Date   CHOL 272 (H) 07/04/2010   TRIG 706 (H) 07/04/2010   HDL 30 (L) 07/04/2010   CHOLHDL 9.1 Ratio 07/04/2010   VLDL NOT CALC mg/dL 07/04/2010   LDLCALC See Comment mg/dL 07/04/2010   LDLCALC  12/20/2009    UNABLE TO CALCULATE IF TRIGLYCERIDE OVER 400 mg/dL        Total Cholesterol/HDL:CHD Risk Coronary Heart Disease Risk Table                     Men   Women  1/2 Average Risk   3.4   3.3  Average Risk       5.0   4.4  2 X Average Risk   9.6   7.1  3 X Average Risk  23.4   11.0        Use the calculated Patient Ratio above and the CHD Risk Table to determine the patient's CHD Risk.        ATP III CLASSIFICATION (LDL):  <100     mg/dL   Optimal  100-129  mg/dL   Near or Above                    Optimal  130-159  mg/dL   Borderline  160-189  mg/dL   High  >190     mg/dL   Very High     Current Medications: Current Outpatient Prescriptions  Medication Sig Dispense Refill  . ALPRAZolam (XANAX) 0.5 MG tablet Take 1 tablet (0.5 mg total) by mouth 3 (three) times daily. 90 tablet 2  . amLODipine (NORVASC) 5 MG tablet Take 5 mg by mouth at bedtime.     . fentaNYL (DURAGESIC - DOSED MCG/HR) 50 MCG/HR Apply 50 mcg/hr topically every 3 (three) days.    Marland Kitchen  FLUoxetine (PROZAC) 20 MG capsule Take 1 capsule (20 mg total) by mouth every morning. 30 capsule 2  . ibuprofen (ADVIL,MOTRIN) 200 MG tablet Take 600 mg by mouth daily as needed for headache or moderate pain.     Marland Kitchen lamoTRIgine (LAMICTAL) 100 MG tablet Taking 0.5 tablets in AM 1 tablet at Night 60 tablet 2  . traZODone (DESYREL) 100 MG tablet Take 1 tablet (100 mg total) by mouth at bedtime. 30 tablet 2  . baclofen (LIORESAL) 10 MG tablet Take 10 mg by mouth daily.    Marland Kitchen ezetimibe (ZETIA) 10 MG tablet Take 1 tablet by mouth daily.     No current facility-administered medications for this visit.     Neurologic: Headache: No Seizure: No Paresthesias:No  Musculoskeletal: Strength & Muscle Tone: decreased Gait & Station: shuffle Patient leans: N/A  Psychiatric Specialty Exam: Review of Systems  Constitutional: Positive for malaise/fatigue.  Musculoskeletal: Positive for back pain and neck pain.  Psychiatric/Behavioral: Positive for depression. The patient is nervous/anxious and has insomnia.   All other systems reviewed and are negative.   Blood pressure 120/80, pulse 70, height 5\' 6"  (1.676 m), weight 220 lb (99.8 kg).Body mass index is 35.51 kg/m.  General Appearance: Casual and Fairly Groomed  Eye Contact:  Good  Speech:  Clear and Coherent  Volume:  Normal  Mood:  Fairly good   Affect:  Bright   Thought Process:  Goal Directed  Orientation:  Full (Time, Place, and Person)  Thought Content:  WDL  Suicidal Thoughts:  No  Homicidal Thoughts:  No  Memory:  Good   Judgement:  Good  Insight:  Fair  Psychomotor Activity:  Decreased  Concentration:  Concentration: Good and Attention Span: Good  Recall:  Good  Fund of Knowledge:Good  Language: Good  Akathisia:  No  Handed:  Right  AIMS (if indicated):    Assets:  Communication Skills Desire for Improvement Leisure Time Resilience Social Support Talents/Skills  ADL's:  Intact  Cognition: WNL  Sleep:  poor     Treatment Plan Summary: Medication management   The patient will Continue Prozac 20 mg daily He will continue Lamictal 100 mg in the morning and 50 mg in the evening for mood stabilization and Xanax 0.5 mg will be Continued at 3 times a day for anxiety . He will continue trazodone 100 mg at bedtime for sleep. He'll continue counseling here and return to see me in 3 months   Levonne Spiller, MD 9/11/20181:39 PMPatient ID: Stephen Porter, male   DOB: [birth year], 67 y.o.   MRN: [id number]

## 2017-06-12 DIAGNOSIS — I10 Essential (primary) hypertension: Secondary | ICD-10-CM | POA: Diagnosis not present

## 2017-06-12 DIAGNOSIS — Z299 Encounter for prophylactic measures, unspecified: Secondary | ICD-10-CM | POA: Diagnosis not present

## 2017-06-12 DIAGNOSIS — K5010 Crohn's disease of large intestine without complications: Secondary | ICD-10-CM | POA: Diagnosis not present

## 2017-06-12 DIAGNOSIS — E7800 Pure hypercholesterolemia, unspecified: Secondary | ICD-10-CM | POA: Diagnosis not present

## 2017-06-12 DIAGNOSIS — Z6831 Body mass index (BMI) 31.0-31.9, adult: Secondary | ICD-10-CM | POA: Diagnosis not present

## 2017-06-12 DIAGNOSIS — G8929 Other chronic pain: Secondary | ICD-10-CM | POA: Diagnosis not present

## 2017-06-12 DIAGNOSIS — F329 Major depressive disorder, single episode, unspecified: Secondary | ICD-10-CM | POA: Diagnosis not present

## 2017-06-12 DIAGNOSIS — Z79899 Other long term (current) drug therapy: Secondary | ICD-10-CM | POA: Diagnosis not present

## 2017-06-12 DIAGNOSIS — F17210 Nicotine dependence, cigarettes, uncomplicated: Secondary | ICD-10-CM | POA: Diagnosis not present

## 2017-06-12 DIAGNOSIS — M25519 Pain in unspecified shoulder: Secondary | ICD-10-CM | POA: Diagnosis not present

## 2017-06-12 DIAGNOSIS — F1120 Opioid dependence, uncomplicated: Secondary | ICD-10-CM | POA: Diagnosis not present

## 2017-06-12 DIAGNOSIS — K5190 Ulcerative colitis, unspecified, without complications: Secondary | ICD-10-CM | POA: Diagnosis not present

## 2017-06-26 ENCOUNTER — Other Ambulatory Visit (HOSPITAL_COMMUNITY): Payer: Self-pay | Admitting: Psychiatry

## 2017-08-12 ENCOUNTER — Ambulatory Visit (HOSPITAL_COMMUNITY): Payer: PPO | Admitting: Licensed Clinical Social Worker

## 2017-08-12 DIAGNOSIS — K5010 Crohn's disease of large intestine without complications: Secondary | ICD-10-CM | POA: Diagnosis not present

## 2017-08-12 DIAGNOSIS — I10 Essential (primary) hypertension: Secondary | ICD-10-CM | POA: Diagnosis not present

## 2017-08-12 DIAGNOSIS — J069 Acute upper respiratory infection, unspecified: Secondary | ICD-10-CM | POA: Diagnosis not present

## 2017-08-12 DIAGNOSIS — R21 Rash and other nonspecific skin eruption: Secondary | ICD-10-CM | POA: Diagnosis not present

## 2017-08-12 DIAGNOSIS — F329 Major depressive disorder, single episode, unspecified: Secondary | ICD-10-CM | POA: Diagnosis not present

## 2017-08-12 DIAGNOSIS — M542 Cervicalgia: Secondary | ICD-10-CM | POA: Diagnosis not present

## 2017-08-12 DIAGNOSIS — Z6831 Body mass index (BMI) 31.0-31.9, adult: Secondary | ICD-10-CM | POA: Diagnosis not present

## 2017-08-12 DIAGNOSIS — E7800 Pure hypercholesterolemia, unspecified: Secondary | ICD-10-CM | POA: Diagnosis not present

## 2017-08-12 DIAGNOSIS — Z87891 Personal history of nicotine dependence: Secondary | ICD-10-CM | POA: Diagnosis not present

## 2017-08-12 DIAGNOSIS — Z299 Encounter for prophylactic measures, unspecified: Secondary | ICD-10-CM | POA: Diagnosis not present

## 2017-08-26 ENCOUNTER — Other Ambulatory Visit (HOSPITAL_COMMUNITY): Payer: Self-pay | Admitting: Psychiatry

## 2017-08-26 MED ORDER — FLUOXETINE HCL 20 MG PO CAPS: 20.0000 mg | ORAL_CAPSULE | ORAL | 2 refills | Status: DC

## 2017-09-03 ENCOUNTER — Ambulatory Visit (HOSPITAL_COMMUNITY): Payer: Self-pay | Admitting: Psychiatry

## 2017-09-03 ENCOUNTER — Telehealth (HOSPITAL_COMMUNITY): Payer: Self-pay | Admitting: *Deleted

## 2017-09-03 NOTE — Telephone Encounter (Signed)
returned phone call regarding appointment 09/03/17, left voice message.

## 2017-09-06 ENCOUNTER — Ambulatory Visit (HOSPITAL_COMMUNITY): Payer: Self-pay | Admitting: Psychiatry

## 2017-09-11 ENCOUNTER — Ambulatory Visit (HOSPITAL_COMMUNITY): Payer: PPO | Admitting: Psychiatry

## 2017-09-11 ENCOUNTER — Encounter (HOSPITAL_COMMUNITY): Payer: Self-pay | Admitting: Psychiatry

## 2017-09-11 VITALS — BP 126/77 | HR 78 | Ht 66.0 in | Wt 218.0 lb

## 2017-09-11 DIAGNOSIS — Z818 Family history of other mental and behavioral disorders: Secondary | ICD-10-CM

## 2017-09-11 DIAGNOSIS — Z810 Family history of intellectual disabilities: Secondary | ICD-10-CM

## 2017-09-11 DIAGNOSIS — M542 Cervicalgia: Secondary | ICD-10-CM | POA: Diagnosis not present

## 2017-09-11 DIAGNOSIS — Z811 Family history of alcohol abuse and dependence: Secondary | ICD-10-CM

## 2017-09-11 DIAGNOSIS — Z813 Family history of other psychoactive substance abuse and dependence: Secondary | ICD-10-CM

## 2017-09-11 DIAGNOSIS — Z560 Unemployment, unspecified: Secondary | ICD-10-CM

## 2017-09-11 DIAGNOSIS — F321 Major depressive disorder, single episode, moderate: Secondary | ICD-10-CM

## 2017-09-11 DIAGNOSIS — F419 Anxiety disorder, unspecified: Secondary | ICD-10-CM

## 2017-09-11 DIAGNOSIS — M2550 Pain in unspecified joint: Secondary | ICD-10-CM | POA: Diagnosis not present

## 2017-09-11 DIAGNOSIS — M549 Dorsalgia, unspecified: Secondary | ICD-10-CM

## 2017-09-11 DIAGNOSIS — G894 Chronic pain syndrome: Secondary | ICD-10-CM

## 2017-09-11 MED ORDER — ALPRAZOLAM 0.5 MG PO TABS: 0.5000 mg | ORAL_TABLET | Freq: Three times a day (TID) | ORAL | 2 refills | Status: DC

## 2017-09-11 MED ORDER — FLUOXETINE HCL 20 MG PO CAPS: 20.0000 mg | ORAL_CAPSULE | ORAL | 2 refills | Status: DC

## 2017-09-11 MED ORDER — LAMOTRIGINE 100 MG PO TABS: ORAL_TABLET | ORAL | 2 refills | Status: DC

## 2017-09-11 NOTE — Progress Notes (Signed)
BH MD/PA/NP OP Progress Note  09/11/2017 2:12 PM Stephen Porter  MRN:  [id number]  Chief Complaint:  Chief Complaint    Depression; Anxiety; Follow-up     HPI: This patient is a 61 year old married white male who lives with his wife in [place]. He has 2 grown daughters one son and 3 grandchildren. 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  The patient was referred by his primary physician, Dr. Brigitte Pulse, for further assessment and treatment of depression anxiety and chronic pain.  The patient states that as a younger person he never had any issues with depression. 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 In 2003 he had a neck injury and has had 3 surgeries on it and in 2005 had a back injury and had to have surgery on this as well. He stated that after the back surgery he was in a great deal of pain and dysfunction. He was definitely no longer able to work and he was very frustrated with this. He stated that the back surgeon gave him a high dosage of pain medication and he became addicted. He had to go to [facility] for detox and he claimed he was detoxed without any help with medication. A few months later he had to get on fentanyl patches for the pain.  [facility] because he was so depressed and felt like nothing was worth living for. His father was declining as well and died shortly after this. While there he was started on Cymbalta Lamictal and several other medications which he no longer takes. He was followed here for a time by Dr. walker and Maurice Small for counseling. Eventually however Dr. Manuella Ghazi took over his treatment.  The patient is now on a combination of Cymbalta, low-dose Xanax at bedtime Lamictal. He takes fentanyl patch every 3 days. He still is in a fair amount of pain but doesn't ever want to get  back on pain pills. He states that for the most part his mood is pretty good but he is having a great deal of difficulty sleeping. He states it's been like this ever since he got on narcotics. He's developed a trick of taking the patch off before he goes to sleep and then he's able to sleep better. Benadryl helps as well. He still under good amount of pain during the day but tries to keep moving, babysitting his grandchildren. He denies being depressed now or having any thoughts of suicide. His energy is the greatest but he thinks it's because of his poor sleep. He eats well and his appetite is good and he denies any suicidal ideation or psychotic symptoms. He denies significant anxiety and uses Xanax 0.5 mg twice a day with good relief   Returns after 3 months.  For the most part he is doing okay.  His mood is been good and he has been building things in his workshop.  He and his wife are getting along better.  He is sleeping well at night and only rarely uses trazodone.  His anxiety is under good control. Visit Diagnosis:    ICD-10-CM   1. Current moderate episode of major depressive disorder without prior episode (HCC) F32.1 ALPRAZolam (XANAX) 0.5 MG tablet    Past Psychiatric History: Prior hospitalization [facility] but primarily outpatient treatment  Past Medical History:  Past Medical History:  Diagnosis Date  . Bipolar disorder ([place])   . DDD (degenerative disc disease)   . Depression   . Hyperlipidemia   . Insomnia   . Jejunal ulcer   . Leukocytosis 04/12/2016  . Skin rash     Past Surgical History:  Procedure Laterality Date  . BACK SURGERY    . CERVICAL SPINE SURGERY     x3  . CHOLECYSTECTOMY    . COLONOSCOPY  2006  . COLONOSCOPY N/A 06/30/2014   Procedure: COLONOSCOPY;  Surgeon: Rogene Houston, MD;  Location[facility];  Service: Endoscopy;  Laterality: N/A;  830  . LUMBAR SPINE SURGERY    . UPPER GASTROINTESTINAL ENDOSCOPY      Family  Psychiatric History: See below  Family History:  Family History  Problem Relation Age of Onset  . Hypertension Sister   . Anxiety disorder Sister   . Hypertension Brother   . Hypertension Brother   . Hypertension Brother   . Dementia Mother   . Paranoid behavior Mother   . Dementia Father   . Alcohol abuse Paternal Uncle   . Drug abuse Paternal Uncle   . ADD / ADHD Neg Hx   . Depression Neg Hx   . OCD Neg Hx   . Schizophrenia Neg Hx   . Seizures Neg Hx   . Sexual abuse Neg Hx   . Physical abuse Neg Hx   . Colon cancer Neg Hx     Social History:  Social History   Socioeconomic History  . Marital status: Married    Spouse name: None  . Number of children: None  . Years of education: None  . Highest education level: None  Social Needs  . Financial resource strain: None  . Food insecurity - worry: None  . Food insecurity - inability: None  . Transportation needs - medical: None  . Transportation needs - non-medical: None  Occupational History  . Occupation: Scientist, research (life sciences): UNEMPLOYED  Tobacco Use  . Smoking status: Former Smoker    Packs/day: 1.00    Years: 40.00    Pack years: 40.00    Types: Cigarettes  . Smokeless tobacco: Current User  . Tobacco comment: 07-05-2016 Dip, 01-22-2017 per pt he stopped Sep 25, 2016  Substance and Sexual Activity  . Alcohol use: No    Comment: 07-05-2016 per pt no  . Drug use: No    Comment: 07-05-2016 per pt no and he stopped Marijuana 1 yr ago  . Sexual activity: None  Other Topics Concern  . None  Social History Narrative   Married with 3 children and lives locally   No regular exercise    Allergies:  Allergies  Allergen Reactions  . Elavil [Amitriptyline] Other (See Comments)    Shaky    Metabolic Disorder Labs: No results found for: HGBA1C, MPG No results found for: PROLACTIN Lab Results  Component Value Date   CHOL 272 (H) 07/04/2010   TRIG 706 (H) 07/04/2010   HDL 30 (L) 07/04/2010   CHOLHDL 9.1  Ratio 07/04/2010   VLDL NOT CALC mg/dL 07/04/2010   LDLCALC See Comment mg/dL 07/04/2010   LDLCALC  12/20/2009    UNABLE TO CALCULATE IF TRIGLYCERIDE OVER 400 mg/dL        Total Cholesterol/HDL:CHD Risk Coronary Heart Disease Risk Table                     Men  Women  1/2 Average Risk   3.4   3.3  Average Risk       5.0   4.4  2 X Average Risk   9.6   7.1  3 X Average Risk  23.4   11.0        Use the calculated Patient Ratio above and the CHD Risk Table to determine the patient's CHD Risk.        ATP III CLASSIFICATION (LDL):  <100     mg/dL   Optimal  100-129  mg/dL   Near or Above                    Optimal  130-159  mg/dL   Borderline  160-189  mg/dL   High  >190     mg/dL   Very High   Lab Results  Component Value Date   TSH 0.959 02/23/2012   TSH 1.237 07/04/2010    Therapeutic Level Labs: No results found for: LITHIUM No results found for: VALPROATE No components found for:  CBMZ  Current Medications: Current Outpatient Medications  Medication Sig Dispense Refill  . ALPRAZolam (XANAX) 0.5 MG tablet Take 1 tablet (0.5 mg total) by mouth 3 (three) times daily. 90 tablet 2  . amLODipine (NORVASC) 5 MG tablet Take 5 mg by mouth at bedtime.     . baclofen (LIORESAL) 10 MG tablet Take 10 mg by mouth daily.    Marland Kitchen ezetimibe (ZETIA) 10 MG tablet Take 1 tablet by mouth daily.    . fentaNYL (DURAGESIC - DOSED MCG/HR) 50 MCG/HR Apply 50 mcg/hr topically every 3 (three) days.    Marland Kitchen FLUoxetine (PROZAC) 20 MG capsule Take 1 capsule (20 mg total) by mouth every morning. 30 capsule 2  . ibuprofen (ADVIL,MOTRIN) 200 MG tablet Take 600 mg by mouth daily as needed for headache or moderate pain.     Marland Kitchen lamoTRIgine (LAMICTAL) 100 MG tablet Taking 0.5 tablets in AM 1 tablet at Night 60 tablet 2  . traZODone (DESYREL) 100 MG tablet Take 1 tablet (100 mg total) by mouth at bedtime. 30 tablet 2   No current facility-administered medications for this visit.       Musculoskeletal: Strength & Muscle Tone: within normal limits Gait & Station: normal Patient leans: N/A  Psychiatric Specialty Exam: Review of Systems  Musculoskeletal: Positive for back pain, joint pain and neck pain.  All other systems reviewed and are negative.   Blood pressure 126/77, pulse 78, height 5\' 6"  (1.676 m), weight 218 lb (98.9 kg), SpO2 93 %.Body mass index is 35.19 kg/m.  General Appearance: Casual and Fairly Groomed  Eye Contact:  Good  Speech:  Clear and Coherent  Volume:  Normal  Mood:  Euthymic  Affect:  Congruent  Thought Process:  Goal Directed  Orientation:  Full (Time, Place, and Person)  Thought Content: WDL   Suicidal Thoughts:  No  Homicidal Thoughts:  No  Memory:  Immediate;   Good Recent;   Good Remote;   Fair  Judgement:  Fair  Insight:  Fair  Psychomotor Activity:  Decreased  Concentration:  Concentration: Good and Attention Span: Good  Recall:  Good  Fund of Knowledge: Good  Language: Good  Akathisia:  No  Handed:  Right  AIMS (if indicated): not done  Assets:  Communication Skills Desire for Improvement Resilience Social Support Talents/Skills  ADL's:  Intact  Cognition: WNL  Sleep:  Good   Screenings:   Assessment and Plan: Patient  is a 61 year old male with a history of depression anxiety and chronic pain.  He is doing fairly well on this current regimen.  He will continue Lamictal 100 mg daily for mood stabilization, Prozac 20 mg every morning for depression and Xanax 0.5 mg 3 times daily for anxiety.  He continues on fentanyl patch from his primary doctor.  He will return to see me in 3 months   Levonne Spiller, MD 09/11/2017, 2:12 PM

## 2017-10-11 DIAGNOSIS — Z87891 Personal history of nicotine dependence: Secondary | ICD-10-CM | POA: Diagnosis not present

## 2017-10-11 DIAGNOSIS — I10 Essential (primary) hypertension: Secondary | ICD-10-CM | POA: Diagnosis not present

## 2017-10-11 DIAGNOSIS — G8929 Other chronic pain: Secondary | ICD-10-CM | POA: Diagnosis not present

## 2017-10-11 DIAGNOSIS — Z6830 Body mass index (BMI) 30.0-30.9, adult: Secondary | ICD-10-CM | POA: Diagnosis not present

## 2017-10-11 DIAGNOSIS — Z299 Encounter for prophylactic measures, unspecified: Secondary | ICD-10-CM | POA: Diagnosis not present

## 2017-11-15 DIAGNOSIS — K5190 Ulcerative colitis, unspecified, without complications: Secondary | ICD-10-CM | POA: Diagnosis not present

## 2017-11-15 DIAGNOSIS — F1120 Opioid dependence, uncomplicated: Secondary | ICD-10-CM | POA: Diagnosis not present

## 2017-11-15 DIAGNOSIS — I10 Essential (primary) hypertension: Secondary | ICD-10-CM | POA: Diagnosis not present

## 2017-11-15 DIAGNOSIS — M542 Cervicalgia: Secondary | ICD-10-CM | POA: Diagnosis not present

## 2017-11-15 DIAGNOSIS — Z299 Encounter for prophylactic measures, unspecified: Secondary | ICD-10-CM | POA: Diagnosis not present

## 2017-11-15 DIAGNOSIS — Z6830 Body mass index (BMI) 30.0-30.9, adult: Secondary | ICD-10-CM | POA: Diagnosis not present

## 2017-11-15 DIAGNOSIS — Z87891 Personal history of nicotine dependence: Secondary | ICD-10-CM | POA: Diagnosis not present

## 2017-12-10 ENCOUNTER — Ambulatory Visit (INDEPENDENT_AMBULATORY_CARE_PROVIDER_SITE_OTHER): Payer: PPO | Admitting: Psychiatry

## 2017-12-10 ENCOUNTER — Encounter (HOSPITAL_COMMUNITY): Payer: Self-pay | Admitting: Psychiatry

## 2017-12-10 DIAGNOSIS — F419 Anxiety disorder, unspecified: Secondary | ICD-10-CM | POA: Diagnosis not present

## 2017-12-10 DIAGNOSIS — M2550 Pain in unspecified joint: Secondary | ICD-10-CM

## 2017-12-10 DIAGNOSIS — M542 Cervicalgia: Secondary | ICD-10-CM | POA: Diagnosis not present

## 2017-12-10 DIAGNOSIS — Z560 Unemployment, unspecified: Secondary | ICD-10-CM | POA: Diagnosis not present

## 2017-12-10 DIAGNOSIS — Z87891 Personal history of nicotine dependence: Secondary | ICD-10-CM | POA: Diagnosis not present

## 2017-12-10 DIAGNOSIS — Z813 Family history of other psychoactive substance abuse and dependence: Secondary | ICD-10-CM

## 2017-12-10 DIAGNOSIS — Z811 Family history of alcohol abuse and dependence: Secondary | ICD-10-CM | POA: Diagnosis not present

## 2017-12-10 DIAGNOSIS — F321 Major depressive disorder, single episode, moderate: Secondary | ICD-10-CM | POA: Diagnosis not present

## 2017-12-10 DIAGNOSIS — M549 Dorsalgia, unspecified: Secondary | ICD-10-CM

## 2017-12-10 DIAGNOSIS — R450 Nervousness: Secondary | ICD-10-CM | POA: Diagnosis not present

## 2017-12-10 DIAGNOSIS — Z736 Limitation of activities due to disability: Secondary | ICD-10-CM

## 2017-12-10 DIAGNOSIS — Z810 Family history of intellectual disabilities: Secondary | ICD-10-CM | POA: Diagnosis not present

## 2017-12-10 MED ORDER — ALPRAZOLAM 0.5 MG PO TABS: 0.5000 mg | ORAL_TABLET | Freq: Three times a day (TID) | ORAL | 3 refills | Status: DC

## 2017-12-10 MED ORDER — FLUOXETINE HCL 20 MG PO CAPS: 20.0000 mg | ORAL_CAPSULE | ORAL | 3 refills | Status: DC

## 2017-12-10 MED ORDER — TRAZODONE HCL 100 MG PO TABS: 100.0000 mg | ORAL_TABLET | Freq: Every day | ORAL | 3 refills | Status: DC

## 2017-12-10 MED ORDER — LAMOTRIGINE 100 MG PO TABS: ORAL_TABLET | ORAL | 3 refills | Status: DC

## 2017-12-10 NOTE — Progress Notes (Signed)
[place] MD/PA/NP OP Progress Note  12/10/2017 1:40 PM Stephen Porter  MRN:  [id number]  Chief Complaint:  Chief Complaint    Depression; Anxiety; Follow-up     HPI: his patient is a 62 year old married white male who lives with his wife in [place]. He has 2 grown daughters one son and 3 grandchildren. 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  The patient was referred by his primary physician, Dr. Brigitte Pulse, for further assessment and treatment of depression anxiety and chronic pain.  The patient states that as a younger person he never had any issues with depression. 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 In 2003 he had a neck injury and has had 3 surgeries on it and in 2005 had a back injury and had to have surgery on this as well. He stated that after the back surgery he was in a great deal of pain and dysfunction. He was definitely no longer able to work and he was very frustrated with this. He stated that the back surgeon gave him a high dosage of pain medication and he became addicted. He had to go to [facility] for detox and he claimed he was detoxed without any help with medication. A few months later he had to get on fentanyl patches for the pain.  In 2013 he was hospitalized at [facility] because he was so depressed and felt like nothing was worth living for. His father was declining as well and died shortly after this. While there he was started on Cymbalta Lamictal and several other medications which he no longer takes. He was followed here for a time by Dr. walker and Maurice Small for counseling. Eventually however Dr. Manuella Ghazi took over his treatment.  The patient is now on a combination of Cymbalta, low-dose Xanax at bedtime Lamictal. He takes fentanyl patch every 3 days. He still is in a fair amount of pain but doesn't ever want to get  back on pain pills. He states that for the most part his mood is pretty good but he is having a great deal of difficulty sleeping. He states it's been like this ever since he got on narcotics. He's developed a trick of taking the patch off before he goes to sleep and then he's able to sleep better. Benadryl helps as well. He still under good amount of pain during the day but tries to keep moving, babysitting his grandchildren. He denies being depressed now or having any thoughts of suicide. His energy is the greatest but he thinks it's because of his poor sleep. He eats well and his appetite is good and he denies any suicidal ideation or psychotic symptoms. He denies significant anxiety and uses Xanax 0.5 mg twice a day with good relief   Patient returns after 3 months.  Generally he is doing fairly well.  He still has a lot of pain in his neck and back but he is going to pain management.  He still has some anxiety but we can only give him a limited amount of Xanax given that he is also on pain medication.  He is trying to stay busy helping care for his grandchildren and he also has been reading a lot of [place] history which she very much enjoys.  Most of the time he sleeps well and does not use the trazodone very  often.  He feels like his depression and anxiety symptoms are under good control and he denies suicidal ideation Visit Diagnosis:    ICD-10-CM   1. Current moderate episode of major depressive disorder without prior episode (HCC) F32.1 ALPRAZolam (XANAX) 0.5 MG tablet    Past Psychiatric History: Prior hospitalizations at [facility] but primarily outpatient treatment  Past Medical History:  Past Medical History:  Diagnosis Date  . Bipolar disorder ([place])   . DDD (degenerative disc disease)   . Depression   . Hyperlipidemia   . Insomnia   . Jejunal ulcer   . Leukocytosis 04/12/2016  . Skin rash     Past Surgical History:  Procedure Laterality Date   . BACK SURGERY    . CERVICAL SPINE SURGERY     x3  . CHOLECYSTECTOMY    . COLONOSCOPY  2006  . COLONOSCOPY N/A 06/30/2014   Procedure: COLONOSCOPY;  Surgeon: Rogene Houston, MD;  Location[facility];  Service: Endoscopy;  Laterality: N/A;  830  . LUMBAR SPINE SURGERY    . UPPER GASTROINTESTINAL ENDOSCOPY      Family Psychiatric History: See below  Family History:  Family History  Problem Relation Age of Onset  . Hypertension Sister   . Anxiety disorder Sister   . Hypertension Brother   . Hypertension Brother   . Hypertension Brother   . Dementia Mother   . Paranoid behavior Mother   . Dementia Father   . Alcohol abuse Paternal Uncle   . Drug abuse Paternal Uncle   . ADD / ADHD Neg Hx   . Depression Neg Hx   . OCD Neg Hx   . Schizophrenia Neg Hx   . Seizures Neg Hx   . Sexual abuse Neg Hx   . Physical abuse Neg Hx   . Colon cancer Neg Hx     Social History:  Social History   Socioeconomic History  . Marital status: Married    Spouse name: None  . Number of children: None  . Years of education: None  . Highest education level: None  Social Needs  . Financial resource strain: None  . Food insecurity - worry: None  . Food insecurity - inability: None  . Transportation needs - medical: None  . Transportation needs - non-medical: None  Occupational History  . Occupation: Scientist, research (life sciences): UNEMPLOYED  Tobacco Use  . Smoking status: Former Smoker    Packs/day: 1.00    Years: 40.00    Pack years: 40.00    Types: Cigarettes  . Smokeless tobacco: Current User  . Tobacco comment: 07-05-2016 Dip, 01-22-2017 per pt he stopped Sep 25, 2016  Substance and Sexual Activity  . Alcohol use: No    Comment: 07-05-2016 per pt no  . Drug use: No    Comment: 07-05-2016 per pt no and he stopped Marijuana 1 yr ago  . Sexual activity: None  Other Topics Concern  . None  Social History Narrative   Married with 3 children and lives locally   No regular exercise     Allergies:  Allergies  Allergen Reactions  . Elavil [Amitriptyline] Other (See Comments)    Shaky    Metabolic Disorder Labs: No results found for: HGBA1C, MPG No results found for: PROLACTIN Lab Results  Component Value Date   CHOL 272 (H) 07/04/2010   TRIG 706 (H) 07/04/2010   HDL 30 (L) 07/04/2010   CHOLHDL 9.1 Ratio 07/04/2010  VLDL NOT CALC mg/dL 07/04/2010   LDLCALC See Comment mg/dL 07/04/2010   LDLCALC  12/20/2009    UNABLE TO CALCULATE IF TRIGLYCERIDE OVER 400 mg/dL        Total Cholesterol/HDL:CHD Risk Coronary Heart Disease Risk Table                     Men   Women  1/2 Average Risk   3.4   3.3  Average Risk       5.0   4.4  2 X Average Risk   9.6   7.1  3 X Average Risk  23.4   11.0        Use the calculated Patient Ratio above and the CHD Risk Table to determine the patient's CHD Risk.        ATP III CLASSIFICATION (LDL):  <100     mg/dL   Optimal  100-129  mg/dL   Near or Above                    Optimal  130-159  mg/dL   Borderline  160-189  mg/dL   High  >190     mg/dL   Very High   Lab Results  Component Value Date   TSH 0.959 02/23/2012   TSH 1.237 07/04/2010    Therapeutic Level Labs: No results found for: LITHIUM No results found for: VALPROATE No components found for:  CBMZ  Current Medications: Current Outpatient Medications  Medication Sig Dispense Refill  . ALPRAZolam (XANAX) 0.5 MG tablet Take 1 tablet (0.5 mg total) by mouth 3 (three) times daily. 90 tablet 3  . amLODipine (NORVASC) 5 MG tablet Take 5 mg by mouth at bedtime.     . baclofen (LIORESAL) 10 MG tablet Take 10 mg by mouth daily.    Marland Kitchen ezetimibe (ZETIA) 10 MG tablet Take 1 tablet by mouth daily.    . fentaNYL (DURAGESIC - DOSED MCG/HR) 50 MCG/HR Apply 50 mcg/hr topically every 3 (three) days.    Marland Kitchen FLUoxetine (PROZAC) 20 MG capsule Take 1 capsule (20 mg total) by mouth every morning. 30 capsule 3  . ibuprofen (ADVIL,MOTRIN) 200 MG tablet Take 600 mg by mouth daily  as needed for headache or moderate pain.     Marland Kitchen lamoTRIgine (LAMICTAL) 100 MG tablet Taking 0.5 tablets in AM 1 tablet at Night 60 tablet 3  . traZODone (DESYREL) 100 MG tablet Take 1 tablet (100 mg total) by mouth at bedtime. 30 tablet 3   No current facility-administered medications for this visit.      Musculoskeletal: Strength & Muscle Tone: within normal limits Gait & Station: normal Patient leans: N/A  Psychiatric Specialty Exam: Review of Systems  Musculoskeletal: Positive for back pain, joint pain and neck pain.  Psychiatric/Behavioral: The patient is nervous/anxious.   All other systems reviewed and are negative.   Blood pressure 133/84, pulse 78, height 5\' 6"  (1.676 m), weight 216 lb (98 kg), SpO2 99 %.Body mass index is 34.86 kg/m.  General Appearance: Casual and Fairly Groomed  Eye Contact:  Good  Speech:  Clear and Coherent  Volume:  Decreased  Mood:  Euthymic  Affect:  Congruent  Thought Process:  Goal Directed  Orientation:  Full (Time, Place, and Person)  Thought Content: WDL   Suicidal Thoughts:  No  Homicidal Thoughts:  No  Memory:  Immediate;   Good Recent;   Good Remote;   Good  Judgement:  Good  Insight:  Good  Psychomotor Activity:  Decreased  Concentration:  Concentration: Good and Attention Span: Good  Recall:  Good  Fund of Knowledge: Good  Language: Good  Akathisia:  No  Handed:  Right  AIMS (if indicated): not done  Assets:  Communication Skills Desire for Improvement Resilience Social Support Talents/Skills  ADL's:  Intact  Cognition: WNL  Sleep:  Fair   Screenings:   Assessment and Plan: This patient is a 62 year old male with a history of depression and anxiety.  Much of this is related to history of having a accident with resultant chronic pain and inability to work.  However he is doing well on his current regimen.  He will continue trazodone 100 mg as needed for sleep at bedtime, Prozac 20 mg daily for depression, Lamictal 50 mg  every morning 100 mg at bedtime for mood stabilization and Xanax 0.5 mg 3 times a day for anxiety.  He will return to see me in 4 months   Levonne Spiller, MD 12/10/2017, 1:40 PM

## 2017-12-13 DIAGNOSIS — G8929 Other chronic pain: Secondary | ICD-10-CM | POA: Diagnosis not present

## 2017-12-13 DIAGNOSIS — K5190 Ulcerative colitis, unspecified, without complications: Secondary | ICD-10-CM | POA: Diagnosis not present

## 2017-12-13 DIAGNOSIS — M542 Cervicalgia: Secondary | ICD-10-CM | POA: Diagnosis not present

## 2017-12-13 DIAGNOSIS — Z6830 Body mass index (BMI) 30.0-30.9, adult: Secondary | ICD-10-CM | POA: Diagnosis not present

## 2017-12-13 DIAGNOSIS — F1120 Opioid dependence, uncomplicated: Secondary | ICD-10-CM | POA: Diagnosis not present

## 2017-12-13 DIAGNOSIS — Z299 Encounter for prophylactic measures, unspecified: Secondary | ICD-10-CM | POA: Diagnosis not present

## 2017-12-13 DIAGNOSIS — Z79899 Other long term (current) drug therapy: Secondary | ICD-10-CM | POA: Diagnosis not present

## 2018-01-09 DIAGNOSIS — F1120 Opioid dependence, uncomplicated: Secondary | ICD-10-CM | POA: Diagnosis not present

## 2018-01-09 DIAGNOSIS — Z299 Encounter for prophylactic measures, unspecified: Secondary | ICD-10-CM | POA: Diagnosis not present

## 2018-01-09 DIAGNOSIS — F17210 Nicotine dependence, cigarettes, uncomplicated: Secondary | ICD-10-CM | POA: Diagnosis not present

## 2018-01-09 DIAGNOSIS — M542 Cervicalgia: Secondary | ICD-10-CM | POA: Diagnosis not present

## 2018-01-09 DIAGNOSIS — G8929 Other chronic pain: Secondary | ICD-10-CM | POA: Diagnosis not present

## 2018-01-09 DIAGNOSIS — Z6831 Body mass index (BMI) 31.0-31.9, adult: Secondary | ICD-10-CM | POA: Diagnosis not present

## 2018-01-09 DIAGNOSIS — I10 Essential (primary) hypertension: Secondary | ICD-10-CM | POA: Diagnosis not present

## 2018-01-09 DIAGNOSIS — K5190 Ulcerative colitis, unspecified, without complications: Secondary | ICD-10-CM | POA: Diagnosis not present

## 2018-02-12 DIAGNOSIS — Z7189 Other specified counseling: Secondary | ICD-10-CM | POA: Diagnosis not present

## 2018-02-12 DIAGNOSIS — Z125 Encounter for screening for malignant neoplasm of prostate: Secondary | ICD-10-CM | POA: Diagnosis not present

## 2018-02-12 DIAGNOSIS — Z1339 Encounter for screening examination for other mental health and behavioral disorders: Secondary | ICD-10-CM | POA: Diagnosis not present

## 2018-02-12 DIAGNOSIS — Z299 Encounter for prophylactic measures, unspecified: Secondary | ICD-10-CM | POA: Diagnosis not present

## 2018-02-12 DIAGNOSIS — Z1331 Encounter for screening for depression: Secondary | ICD-10-CM | POA: Diagnosis not present

## 2018-02-12 DIAGNOSIS — Z0000 Encounter for general adult medical examination without abnormal findings: Secondary | ICD-10-CM | POA: Diagnosis not present

## 2018-02-12 DIAGNOSIS — E7800 Pure hypercholesterolemia, unspecified: Secondary | ICD-10-CM | POA: Diagnosis not present

## 2018-02-12 DIAGNOSIS — Z1211 Encounter for screening for malignant neoplasm of colon: Secondary | ICD-10-CM | POA: Diagnosis not present

## 2018-02-12 DIAGNOSIS — Z6831 Body mass index (BMI) 31.0-31.9, adult: Secondary | ICD-10-CM | POA: Diagnosis not present

## 2018-02-12 DIAGNOSIS — Z79899 Other long term (current) drug therapy: Secondary | ICD-10-CM | POA: Diagnosis not present

## 2018-02-12 DIAGNOSIS — K5190 Ulcerative colitis, unspecified, without complications: Secondary | ICD-10-CM | POA: Diagnosis not present

## 2018-02-12 DIAGNOSIS — I10 Essential (primary) hypertension: Secondary | ICD-10-CM | POA: Diagnosis not present

## 2018-02-12 DIAGNOSIS — R5383 Other fatigue: Secondary | ICD-10-CM | POA: Diagnosis not present

## 2018-03-14 DIAGNOSIS — Z299 Encounter for prophylactic measures, unspecified: Secondary | ICD-10-CM | POA: Diagnosis not present

## 2018-03-14 DIAGNOSIS — Z79899 Other long term (current) drug therapy: Secondary | ICD-10-CM | POA: Diagnosis not present

## 2018-03-14 DIAGNOSIS — F1120 Opioid dependence, uncomplicated: Secondary | ICD-10-CM | POA: Diagnosis not present

## 2018-03-14 DIAGNOSIS — G8929 Other chronic pain: Secondary | ICD-10-CM | POA: Diagnosis not present

## 2018-03-14 DIAGNOSIS — I10 Essential (primary) hypertension: Secondary | ICD-10-CM | POA: Diagnosis not present

## 2018-03-14 DIAGNOSIS — K5190 Ulcerative colitis, unspecified, without complications: Secondary | ICD-10-CM | POA: Diagnosis not present

## 2018-03-14 DIAGNOSIS — Z6831 Body mass index (BMI) 31.0-31.9, adult: Secondary | ICD-10-CM | POA: Diagnosis not present

## 2018-04-08 ENCOUNTER — Other Ambulatory Visit (HOSPITAL_COMMUNITY): Payer: Self-pay | Admitting: Psychiatry

## 2018-04-08 DIAGNOSIS — F321 Major depressive disorder, single episode, moderate: Secondary | ICD-10-CM

## 2018-04-14 ENCOUNTER — Encounter (HOSPITAL_COMMUNITY): Payer: Self-pay | Admitting: Psychiatry

## 2018-04-14 ENCOUNTER — Ambulatory Visit (INDEPENDENT_AMBULATORY_CARE_PROVIDER_SITE_OTHER): Payer: PPO | Admitting: Psychiatry

## 2018-04-14 DIAGNOSIS — Z299 Encounter for prophylactic measures, unspecified: Secondary | ICD-10-CM | POA: Diagnosis not present

## 2018-04-14 DIAGNOSIS — Z818 Family history of other mental and behavioral disorders: Secondary | ICD-10-CM

## 2018-04-14 DIAGNOSIS — Z713 Dietary counseling and surveillance: Secondary | ICD-10-CM | POA: Diagnosis not present

## 2018-04-14 DIAGNOSIS — Z811 Family history of alcohol abuse and dependence: Secondary | ICD-10-CM | POA: Diagnosis not present

## 2018-04-14 DIAGNOSIS — F17210 Nicotine dependence, cigarettes, uncomplicated: Secondary | ICD-10-CM | POA: Diagnosis not present

## 2018-04-14 DIAGNOSIS — F321 Major depressive disorder, single episode, moderate: Secondary | ICD-10-CM

## 2018-04-14 DIAGNOSIS — R450 Nervousness: Secondary | ICD-10-CM | POA: Diagnosis not present

## 2018-04-14 DIAGNOSIS — I10 Essential (primary) hypertension: Secondary | ICD-10-CM | POA: Diagnosis not present

## 2018-04-14 DIAGNOSIS — G8929 Other chronic pain: Secondary | ICD-10-CM | POA: Diagnosis not present

## 2018-04-14 DIAGNOSIS — Z6830 Body mass index (BMI) 30.0-30.9, adult: Secondary | ICD-10-CM | POA: Diagnosis not present

## 2018-04-14 DIAGNOSIS — Z813 Family history of other psychoactive substance abuse and dependence: Secondary | ICD-10-CM | POA: Diagnosis not present

## 2018-04-14 DIAGNOSIS — G4700 Insomnia, unspecified: Secondary | ICD-10-CM | POA: Diagnosis not present

## 2018-04-14 MED ORDER — LAMOTRIGINE 100 MG PO TABS: ORAL_TABLET | ORAL | 3 refills | Status: DC

## 2018-04-14 MED ORDER — TRAZODONE HCL 100 MG PO TABS: 100.0000 mg | ORAL_TABLET | Freq: Every day | ORAL | 3 refills | Status: DC

## 2018-04-14 MED ORDER — FLUOXETINE HCL 20 MG PO CAPS: 20.0000 mg | ORAL_CAPSULE | ORAL | 3 refills | Status: DC

## 2018-04-14 MED ORDER — ALPRAZOLAM 0.5 MG PO TABS: 0.5000 mg | ORAL_TABLET | Freq: Three times a day (TID) | ORAL | 2 refills | Status: DC

## 2018-04-14 NOTE — Progress Notes (Signed)
[facility] MD/PA/NP OP Progress Note  04/14/2018 1:32 PM [facility]  MRN:  [id number]  Chief Complaint:  Chief Complaint    Depression; Anxiety; Follow-up     HPI: This patient is a 62 year old married white male who lives with his wife in [place]. He has 2 grown daughters one son and 3 grandchildren. 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  The patient was referred by his primary physician, Dr. Brigitte Pulse, for further assessment and treatment of depression anxiety and chronic pain.  The patient states that as a younger person he never had any issues with depression. 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 In 2003 he had a neck injury and has had 3 surgeries on it and in 2005 had a back injury and had to have surgery on this as well. He stated that after the back surgery he was in a great deal of pain and dysfunction. He was definitely no longer able to work and he was very frustrated with this. He stated that the back surgeon gave him a high dosage of pain medication and he became addicted. He had to go to [facility] for detox and he claimed he was detoxed without any help with medication. A few months later he had to get on fentanyl patches for the pain.  In 2013 he was [facility] because he was so depressed and felt like nothing was worth living for. His father was declining as well and died shortly after this. While there he was started on Cymbalta Lamictal and several other medications which he no longer takes. He was followed here for a time by Dr. walker and Maurice Small for counseling. Eventually however Dr. Manuella Ghazi took over his treatment.  The patient is now on a combination of Cymbalta, low-dose Xanax at bedtime Lamictal. He takes fentanyl patch every 3 days. He still is in a fair amount of pain but doesn't ever want to get  back on pain pills. He states that for the most part his mood is pretty good but he is having a great deal of difficulty sleeping. He states it's been like this ever since he got on narcotics. He's developed a trick of taking the patch off before he goes to sleep and then he's able to sleep better. Benadryl helps as well. He still under good amount of pain during the day but tries to keep moving, babysitting his grandchildren. He denies being depressed now or having any thoughts of suicide. His energy is the greatest but he thinks it's because of his poor sleep. He eats well and his appetite is good and he denies any suicidal ideation or psychotic symptoms. He denies significant anxiety and uses Xanax 0.5 mg twice a day with good relief  The patient returns after 4 months.  He states that lately his neck pain has flared up very badly.  He is having a lot of pain in his neck head and down his shoulders.  He is on the fentanyl patch from his primary doctor.  It is really not doing enough to help the pain.  He does not want to go back on pain pills because he overuse them in the past and does not want to go through that again.  He does add ibuprofen but it is not helping much.  It sounds as if he  still could use a referral to pain management perhaps for some injections or other modalities and he is going to ask his primary doctor.  He is not sleeping well due to pain in his mood is a little bit low but he denies being suicidal and he still thinks the medications have been helpful.  He still enjoys being with his grandchildren.  His appetite is low and he has lost some weight  Visit Diagnosis:    ICD-10-CM   1. Current moderate episode of major depressive disorder without prior episode (HCC) F32.1 ALPRAZolam (XANAX) 0.5 MG tablet    Past Psychiatric History: Prior hospitalizations at [place] in the [facility] but primarily outpatient treatment  Past Medical History:  Past  Medical History:  Diagnosis Date  . Bipolar disorder ([place])   . DDD (degenerative disc disease)   . Depression   . Hyperlipidemia   . Insomnia   . Jejunal ulcer   . Leukocytosis 04/12/2016  . Skin rash     Past Surgical History:  Procedure Laterality Date  . BACK SURGERY    . CERVICAL SPINE SURGERY     x3  . CHOLECYSTECTOMY    . COLONOSCOPY  2006  . COLONOSCOPY N/A 06/30/2014   Procedure: COLONOSCOPY;  Surgeon: Rogene Houston, MD;  [facility];  Service: Endoscopy;  Laterality: N/A;  830  . LUMBAR SPINE SURGERY    . UPPER GASTROINTESTINAL ENDOSCOPY      Family Psychiatric History: See below  Family History:  Family History  Problem Relation Age of Onset  . Hypertension Sister   . Anxiety disorder Sister   . Hypertension Brother   . Hypertension Brother   . Hypertension Brother   . Dementia Mother   . Paranoid behavior Mother   . Dementia Father   . Alcohol abuse Paternal Uncle   . Drug abuse Paternal Uncle   . ADD / ADHD Neg Hx   . Depression Neg Hx   . OCD Neg Hx   . Schizophrenia Neg Hx   . Seizures Neg Hx   . Sexual abuse Neg Hx   . Physical abuse Neg Hx   . Colon cancer Neg Hx     Social History:  Social History   Socioeconomic History  . Marital status: Married    Spouse name: Not on file  . Number of children: Not on file  . Years of education: Not on file  . Highest education level: Not on file  Occupational History  . Occupation: Scientist, research (life sciences): UNEMPLOYED  Social Needs  . Financial resource strain: Not on file  . Food insecurity:    Worry: Not on file    Inability: Not on file  . Transportation needs:    Medical: Not on file    Non-medical: Not on file  Tobacco Use  . Smoking status: Former Smoker    Packs/day: 1.00    Years: 40.00    Pack years: 40.00    Types: Cigarettes  . Smokeless tobacco: Current User  . Tobacco comment: 07-05-2016 Dip, 01-22-2017 per pt he stopped Sep 25, 2016  Substance and Sexual Activity  .  Alcohol use: No    Comment: 07-05-2016 per pt no  . Drug use: No    Comment: 07-05-2016 per pt no and he stopped Marijuana 1 yr ago  . Sexual activity: Not on file  Lifestyle  . Physical activity:    Days per week: Not on file  Minutes per session: Not on file  . Stress: Not on file  Relationships  . Social connections:    Talks on phone: Not on file    Gets together: Not on file    Attends religious service: Not on file    Active member of club or organization: Not on file    Attends meetings of clubs or organizations: Not on file    Relationship status: Not on file  Other Topics Concern  . Not on file  Social History Narrative   Married with 3 children and lives locally   No regular exercise    Allergies:  Allergies  Allergen Reactions  . Elavil [Amitriptyline] Other (See Comments)    Shaky    Metabolic Disorder Labs: No results found for: HGBA1C, MPG No results found for: PROLACTIN Lab Results  Component Value Date   CHOL 272 (H) 07/04/2010   TRIG 706 (H) 07/04/2010   HDL 30 (L) 07/04/2010   CHOLHDL 9.1 Ratio 07/04/2010   VLDL NOT CALC mg/dL 07/04/2010   LDLCALC See Comment mg/dL 07/04/2010   LDLCALC  12/20/2009    UNABLE TO CALCULATE IF TRIGLYCERIDE OVER 400 mg/dL        Total Cholesterol/HDL:CHD Risk Coronary Heart Disease Risk Table                     Men   Women  1/2 Average Risk   3.4   3.3  Average Risk       5.0   4.4  2 X Average Risk   9.6   7.1  3 X Average Risk  23.4   11.0        Use the calculated Patient Ratio above and the CHD Risk Table to determine the patient's CHD Risk.        ATP III CLASSIFICATION (LDL):  <100     mg/dL   Optimal  100-129  mg/dL   Near or Above                    Optimal  130-159  mg/dL   Borderline  160-189  mg/dL   High  >190     mg/dL   Very High   Lab Results  Component Value Date   TSH 0.959 02/23/2012   TSH 1.237 07/04/2010    Therapeutic Level Labs: No results found for: LITHIUM No results  found for: VALPROATE No components found for:  CBMZ  Current Medications: Current Outpatient Medications  Medication Sig Dispense Refill  . ALPRAZolam (XANAX) 0.5 MG tablet Take 1 tablet (0.5 mg total) by mouth 3 (three) times daily. 90 tablet 2  . amLODipine (NORVASC) 5 MG tablet Take 5 mg by mouth at bedtime.     . baclofen (LIORESAL) 10 MG tablet Take 10 mg by mouth daily.    Marland Kitchen ezetimibe (ZETIA) 10 MG tablet Take 1 tablet by mouth daily.    . fentaNYL (DURAGESIC - DOSED MCG/HR) 50 MCG/HR Apply 50 mcg/hr topically every 3 (three) days.    Marland Kitchen FLUoxetine (PROZAC) 20 MG capsule Take 1 capsule (20 mg total) by mouth every morning. 30 capsule 3  . ibuprofen (ADVIL,MOTRIN) 200 MG tablet Take 600 mg by mouth daily as needed for headache or moderate pain.     Marland Kitchen lamoTRIgine (LAMICTAL) 100 MG tablet Taking 0.5 tablets in AM 1 tablet at Night 60 tablet 3  . traZODone (DESYREL) 100 MG tablet Take 1 tablet (100 mg total) by mouth at  bedtime. 30 tablet 3   No current facility-administered medications for this visit.      Musculoskeletal: Strength & Muscle Tone: within normal limits Gait & Station: normal Patient leans: N/A  Psychiatric Specialty Exam: Review of Systems  Musculoskeletal: Positive for back pain and neck pain.  Neurological: Positive for headaches.  Psychiatric/Behavioral: The patient is nervous/anxious and has insomnia.   All other systems reviewed and are negative.   Blood pressure (!) 159/85, pulse 84, height 5\' 6"  (1.676 m), weight 211 lb (95.7 kg), SpO2 98 %.Body mass index is 34.06 kg/m.  General Appearance: Casual and Fairly Groomed  Eye Contact:  Good  Speech:  Clear and Coherent  Volume:  Normal  Mood:  Anxious and Dysphoric  Affect:  Constricted  Thought Process:  Goal Directed  Orientation:  Full (Time, Place, and Person)  Thought Content: Rumination   Suicidal Thoughts:  No  Homicidal Thoughts:  No  Memory:  Immediate;   Good Recent;   Good Remote;   Good   Judgement:  Good  Insight:  Good  Psychomotor Activity:  Decreased  Concentration:  Concentration: Good and Attention Span: Good  Recall:  Good  Fund of Knowledge: Good  Language: Good  Akathisia:  No  Handed:  Right  AIMS (if indicated): not done  Assets:  Communication Skills Desire for Improvement Resilience Social Support Talents/Skills  ADL's:  Intact  Cognition: WNL  Sleep:  Poor   Screenings:   Assessment and Plan: This patient is a 62 year old male with a history of depression anxiety and chronic pain.  He is going to talk to his primary doctor to see what referrals can be made to help with the pain management.  He does think his psychiatric medications have been helpful.  He will continue Prozac 20 mg daily for depression, trazodone 100 mg at bedtime for sleep and Xanax 0.5 mg 3 times daily for anxiety as well as Lamictal 50 mg in the morning and 100 mg at bedtime for mood stabilization.  He will return to see me in 3 months   Levonne Spiller, MD 04/14/2018, 1:32 PM

## 2018-05-14 DIAGNOSIS — M791 Myalgia, unspecified site: Secondary | ICD-10-CM | POA: Diagnosis not present

## 2018-05-14 DIAGNOSIS — Z6830 Body mass index (BMI) 30.0-30.9, adult: Secondary | ICD-10-CM | POA: Diagnosis not present

## 2018-05-14 DIAGNOSIS — I10 Essential (primary) hypertension: Secondary | ICD-10-CM | POA: Diagnosis not present

## 2018-05-14 DIAGNOSIS — Z299 Encounter for prophylactic measures, unspecified: Secondary | ICD-10-CM | POA: Diagnosis not present

## 2018-05-14 DIAGNOSIS — K5190 Ulcerative colitis, unspecified, without complications: Secondary | ICD-10-CM | POA: Diagnosis not present

## 2018-06-16 DIAGNOSIS — I10 Essential (primary) hypertension: Secondary | ICD-10-CM | POA: Diagnosis not present

## 2018-06-16 DIAGNOSIS — Z299 Encounter for prophylactic measures, unspecified: Secondary | ICD-10-CM | POA: Diagnosis not present

## 2018-06-16 DIAGNOSIS — G8929 Other chronic pain: Secondary | ICD-10-CM | POA: Diagnosis not present

## 2018-06-16 DIAGNOSIS — Z23 Encounter for immunization: Secondary | ICD-10-CM | POA: Diagnosis not present

## 2018-06-16 DIAGNOSIS — Z6830 Body mass index (BMI) 30.0-30.9, adult: Secondary | ICD-10-CM | POA: Diagnosis not present

## 2018-06-16 DIAGNOSIS — F1120 Opioid dependence, uncomplicated: Secondary | ICD-10-CM | POA: Diagnosis not present

## 2018-07-01 ENCOUNTER — Other Ambulatory Visit (HOSPITAL_COMMUNITY): Payer: Self-pay | Admitting: Psychiatry

## 2018-07-01 MED ORDER — LAMOTRIGINE 100 MG PO TABS: ORAL_TABLET | ORAL | 3 refills | Status: DC

## 2018-07-15 ENCOUNTER — Ambulatory Visit (INDEPENDENT_AMBULATORY_CARE_PROVIDER_SITE_OTHER): Payer: PPO | Admitting: Psychiatry

## 2018-07-15 ENCOUNTER — Encounter (HOSPITAL_COMMUNITY): Payer: Self-pay | Admitting: Psychiatry

## 2018-07-15 DIAGNOSIS — F321 Major depressive disorder, single episode, moderate: Secondary | ICD-10-CM | POA: Diagnosis not present

## 2018-07-15 MED ORDER — LAMOTRIGINE 100 MG PO TABS: ORAL_TABLET | ORAL | 3 refills | Status: DC

## 2018-07-15 MED ORDER — ALPRAZOLAM 0.5 MG PO TABS: 0.5000 mg | ORAL_TABLET | Freq: Three times a day (TID) | ORAL | 3 refills | Status: DC

## 2018-07-15 MED ORDER — FLUOXETINE HCL 20 MG PO CAPS: 20.0000 mg | ORAL_CAPSULE | ORAL | 3 refills | Status: DC

## 2018-07-15 NOTE — Progress Notes (Signed)
**Stephen Stephen** Stephen Stephen  07/15/2018 1:32 PM ED[facility]  MRN:  [id number]  Chief Complaint:  Chief Complaint    Depression; Anxiety; Follow-up     HPI: This patient is a 62 year old married white male who lives with his wife in [place]. He has 2 grown daughters one son and 3 grandchildren. 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  The patient was referred by his primary physician, Dr. Brigitte Pulse, for further assessment and treatment of depression anxiety and chronic pain.  The patient states that as a younger person he never had any issues with depression. 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 In 2003 he had a neck injury and has had 3 surgeries on it and in 2005 had a back injury and had to have surgery on this as well. He stated that after the back surgery he was in a great deal of pain and dysfunction. He was definitely no longer able to work and he was very frustrated with this. He stated that the back surgeon gave him a high dosage of pain medication and he became addicted. He had to go to [facility] for detox and he claimed he was detoxed without any help with medication. A few months later he had to get on fentanyl patches for the pain.  In 2013 he was hospitalized at [facility] because he was so depressed and felt like nothing was worth living for. His father was declining as well and died shortly after this. While there he was started on Cymbalta Lamictal and several other medications which he no longer takes. He was followed here for a time by Dr. walker and Maurice Small for counseling. Eventually however Dr. Manuella Ghazi took over his treatment.  The patient is now on a combination of Cymbalta, low-dose Xanax at bedtime Lamictal. He takes fentanyl patch every 3 days. He still is in a fair amount of pain but doesn't ever want to get  back on pain pills. He states that for the most part his mood is pretty good but he is having a great deal of difficulty sleeping. He states it's been like this ever since he got on narcotics. He's developed a trick of taking the patch off before he goes to sleep and then he's able to sleep better. Benadryl helps as well. He still under good amount of pain during the day but tries to keep moving, babysitting his grandchildren. He denies being depressed now or having any thoughts of suicide. His energy is the greatest but he thinks it's because of his poor sleep. He eats well and his appetite is good and he denies any suicidal ideation or psychotic symptoms. He denies significant anxiety and uses Xanax 0.5 mg twice a day with good relief  The patient returns after 3 months.  He continues to do well.  He states recently his neck pain is gotten better because he has been using a combination of ice followed by heat as well as ibuprofen.  The fentanyl patch still helps.  He has been more active and doing things with his grandchildren.  Sometimes he does not sleep well but he no longer wants to take trazodone because it makes him feel too drowsy the next day.  His mood has been stable and he denies any significant symptoms of depression panic attacks or suicidal ideation. Visit  Diagnosis:    ICD-10-CM   1. Current moderate episode of major depressive disorder without prior episode (HCC) F32.1 ALPRAZolam (XANAX) 0.5 MG tablet    Past Psychiatric History: Prior hospitalizations at [place] in the [facility] [place]  Past Medical History:  Past Medical History:  Diagnosis Date  . Bipolar disorder ([place])   . DDD (degenerative disc disease)   . Depression   . Hyperlipidemia   . Insomnia   . Jejunal ulcer   . Leukocytosis 04/12/2016  . Skin rash     Past Surgical History:  Procedure Laterality Date  . BACK SURGERY    . CERVICAL SPINE SURGERY     x3  . CHOLECYSTECTOMY    .  COLONOSCOPY  2006  . COLONOSCOPY N/A 06/30/2014   Procedure: COLONOSCOPY;  Surgeon: Rogene Houston, MD;  Location[facility];  Service: Endoscopy;  Laterality: N/A;  830  . LUMBAR SPINE SURGERY    . UPPER GASTROINTESTINAL ENDOSCOPY      Family Psychiatric History: See below  Family History:  Family History  Problem Relation Age of Onset  . Hypertension Sister   . Anxiety disorder Sister   . Hypertension Brother   . Hypertension Brother   . Hypertension Brother   . Dementia Mother   . Paranoid behavior Mother   . Dementia Father   . Alcohol abuse Paternal Uncle   . Drug abuse Paternal Uncle   . ADD / ADHD Neg Hx   . Depression Neg Hx   . OCD Neg Hx   . Schizophrenia Neg Hx   . Seizures Neg Hx   . Sexual abuse Neg Hx   . Physical abuse Neg Hx   . Colon cancer Neg Hx     Social History:  Social History   Socioeconomic History  . Marital status: Married    Spouse name: Not on file  . Number of children: Not on file  . Years of education: Not on file  . Highest education level: Not on file  Occupational History  . Occupation: Scientist, research (life sciences): UNEMPLOYED  Social Needs  . Financial resource strain: Not on file  . Food insecurity:    Worry: Not on file    Inability: Not on file  . Transportation needs:    Medical: Not on file    Non-medical: Not on file  Tobacco Use  . Smoking status: Former Smoker    Packs/day: 1.00    Years: 40.00    Pack years: 40.00    Types: Cigarettes  . Smokeless tobacco: Current User  . Tobacco comment: 07-05-2016 Dip, 01-22-2017 per pt he stopped Sep 25, 2016  Substance and Sexual Activity  . Alcohol use: No    Comment: 07-05-2016 per pt no  . Drug use: No    Comment: 07-05-2016 per pt no and he stopped Marijuana 1 yr ago  . Sexual activity: Not on file  Lifestyle  . Physical activity:    Days per week: Not on file    Minutes per session: Not on file  . Stress: Not on file  Relationships  . Social connections:    Talks  on phone: Not on file    Gets together: Not on file    Attends religious service: Not on file    Active member of club or organization: Not on file    Attends meetings of clubs or organizations: Not on file    Relationship status: Not on file  Other Topics Concern  . Not on file  Social History Narrative   Married with 3 children and lives locally   No regular exercise    Allergies:  Allergies  Allergen Reactions  . Elavil [Amitriptyline] Other (See Comments)    Shaky    Metabolic Disorder Labs: No results found for: HGBA1C, MPG No results found for: PROLACTIN Lab Results  Component Value Date   CHOL 272 (H) 07/04/2010   TRIG 706 (H) 07/04/2010   HDL 30 (L) 07/04/2010   CHOLHDL 9.1 Ratio 07/04/2010   VLDL NOT CALC mg/dL 07/04/2010   LDLCALC See Comment mg/dL 07/04/2010   LDLCALC  12/20/2009    UNABLE TO CALCULATE IF TRIGLYCERIDE OVER 400 mg/dL        Total Cholesterol/HDL:CHD Risk Coronary Heart Disease Risk Table                     Men   Women  1/2 Average Risk   3.4   3.3  Average Risk       5.0   4.4  2 X Average Risk   9.6   7.1  3 X Average Risk  23.4   11.0        Use the calculated Patient Ratio above and the CHD Risk Table to determine the patient's CHD Risk.        ATP III CLASSIFICATION (LDL):  <100     mg/dL   Optimal  100-129  mg/dL   Near or Above                    Optimal  130-159  mg/dL   Borderline  160-189  mg/dL   High  >190     mg/dL   Very High   Lab Results  Component Value Date   TSH 0.959 02/23/2012   TSH 1.237 07/04/2010    Therapeutic Level Labs: No results found for: LITHIUM No results found for: VALPROATE No components found for:  CBMZ  Current Medications: Current Outpatient Medications  Medication Sig Dispense Refill  . ALPRAZolam (XANAX) 0.5 MG tablet Take 1 tablet (0.5 mg total) by mouth 3 (three) times daily. 90 tablet 3  . amLODipine (NORVASC) 5 MG tablet Take 5 mg by mouth at bedtime.     . baclofen (LIORESAL) 10  MG tablet Take 10 mg by mouth daily.    Marland Kitchen ezetimibe (ZETIA) 10 MG tablet Take 1 tablet by mouth daily.    . fentaNYL (DURAGESIC - DOSED MCG/HR) 50 MCG/HR Apply 50 mcg/hr topically every 3 (three) days.    Marland Kitchen FLUoxetine (PROZAC) 20 MG capsule Take 1 capsule (20 mg total) by mouth every morning. 30 capsule 3  . ibuprofen (ADVIL,MOTRIN) 200 MG tablet Take 600 mg by mouth daily as needed for headache or moderate pain.     Marland Kitchen lamoTRIgine (LAMICTAL) 100 MG tablet Taking 0.5 tablets in AM 1 tablet at Night 60 tablet 3   No current facility-administered medications for this visit.      Musculoskeletal: Strength & Muscle Tone: within normal limits Gait & Station: normal Patient leans: N/A  Psychiatric Specialty Exam: Review of Systems  Musculoskeletal: Positive for back pain, joint pain and neck pain.  All other systems reviewed and are negative.   Blood pressure 110/73, pulse 79, height 5\' 6"  (1.676 m), weight 214 lb (97.1 kg), SpO2 96 %.Body mass index is 34.54 kg/m.  General Appearance: Casual and Fairly Groomed  Eye Contact:  Good  Speech:  Clear and Coherent  Volume:  Normal  Mood:  Euthymic  Affect:  Congruent  Thought Process:  Goal Directed  Orientation:  Full (Time, Place, and Person)  Thought Content: WDL   Suicidal Thoughts:  No  Homicidal Thoughts:  No  Memory:  Immediate;   Good Recent;   Good Remote;   Good  Judgement:  Good  Insight:  Fair  Psychomotor Activity:  Decreased  Concentration:  Concentration: Good and Attention Span: Good  Recall:  Good  Fund of Knowledge: Good  Language: Good  Akathisia:  No  Handed:  Right  AIMS (if indicated): not done  Assets:  Communication Skills Desire for Improvement Resilience Social Support Talents/Skills Transportation  ADL's:  Intact  Cognition: WNL  Sleep:  Fair   Screenings:   Assessment and Plan: This patient is a 62 year old male with a history of chronic pain depression and anxiety.  He seems to be doing  well on his current regimen.  He will continue Prozac 20 mg daily for depression, Lamictal 50 mg every morning and 100 mg at bedtime for mood swings and Xanax 0.5 mg 3 times daily for anxiety.  He will return to see me in 4 months   Levonne Spiller, MD 07/15/2018, 1:32 PM

## 2018-07-16 DIAGNOSIS — I10 Essential (primary) hypertension: Secondary | ICD-10-CM | POA: Diagnosis not present

## 2018-07-16 DIAGNOSIS — M545 Low back pain: Secondary | ICD-10-CM | POA: Diagnosis not present

## 2018-07-16 DIAGNOSIS — Z6830 Body mass index (BMI) 30.0-30.9, adult: Secondary | ICD-10-CM | POA: Diagnosis not present

## 2018-07-16 DIAGNOSIS — K5190 Ulcerative colitis, unspecified, without complications: Secondary | ICD-10-CM | POA: Diagnosis not present

## 2018-07-16 DIAGNOSIS — F1120 Opioid dependence, uncomplicated: Secondary | ICD-10-CM | POA: Diagnosis not present

## 2018-07-16 DIAGNOSIS — Z299 Encounter for prophylactic measures, unspecified: Secondary | ICD-10-CM | POA: Diagnosis not present

## 2018-08-19 DIAGNOSIS — E7800 Pure hypercholesterolemia, unspecified: Secondary | ICD-10-CM | POA: Diagnosis not present

## 2018-08-19 DIAGNOSIS — Z299 Encounter for prophylactic measures, unspecified: Secondary | ICD-10-CM | POA: Diagnosis not present

## 2018-08-19 DIAGNOSIS — I10 Essential (primary) hypertension: Secondary | ICD-10-CM | POA: Diagnosis not present

## 2018-08-19 DIAGNOSIS — F329 Major depressive disorder, single episode, unspecified: Secondary | ICD-10-CM | POA: Diagnosis not present

## 2018-08-19 DIAGNOSIS — M791 Myalgia, unspecified site: Secondary | ICD-10-CM | POA: Diagnosis not present

## 2018-08-19 DIAGNOSIS — Z6831 Body mass index (BMI) 31.0-31.9, adult: Secondary | ICD-10-CM | POA: Diagnosis not present

## 2018-09-18 DIAGNOSIS — Z299 Encounter for prophylactic measures, unspecified: Secondary | ICD-10-CM | POA: Diagnosis not present

## 2018-09-18 DIAGNOSIS — Z6831 Body mass index (BMI) 31.0-31.9, adult: Secondary | ICD-10-CM | POA: Diagnosis not present

## 2018-09-18 DIAGNOSIS — R0981 Nasal congestion: Secondary | ICD-10-CM | POA: Diagnosis not present

## 2018-09-18 DIAGNOSIS — F1120 Opioid dependence, uncomplicated: Secondary | ICD-10-CM | POA: Diagnosis not present

## 2018-09-18 DIAGNOSIS — M791 Myalgia, unspecified site: Secondary | ICD-10-CM | POA: Diagnosis not present

## 2018-09-18 DIAGNOSIS — I10 Essential (primary) hypertension: Secondary | ICD-10-CM | POA: Diagnosis not present

## 2018-10-20 DIAGNOSIS — Z87891 Personal history of nicotine dependence: Secondary | ICD-10-CM | POA: Diagnosis not present

## 2018-10-20 DIAGNOSIS — G8929 Other chronic pain: Secondary | ICD-10-CM | POA: Diagnosis not present

## 2018-10-20 DIAGNOSIS — I10 Essential (primary) hypertension: Secondary | ICD-10-CM | POA: Diagnosis not present

## 2018-10-20 DIAGNOSIS — K5190 Ulcerative colitis, unspecified, without complications: Secondary | ICD-10-CM | POA: Diagnosis not present

## 2018-10-20 DIAGNOSIS — Z6831 Body mass index (BMI) 31.0-31.9, adult: Secondary | ICD-10-CM | POA: Diagnosis not present

## 2018-10-20 DIAGNOSIS — Z299 Encounter for prophylactic measures, unspecified: Secondary | ICD-10-CM | POA: Diagnosis not present

## 2018-11-13 ENCOUNTER — Ambulatory Visit (INDEPENDENT_AMBULATORY_CARE_PROVIDER_SITE_OTHER): Payer: PPO | Admitting: Psychiatry

## 2018-11-13 ENCOUNTER — Encounter (HOSPITAL_COMMUNITY): Payer: Self-pay | Admitting: Psychiatry

## 2018-11-13 DIAGNOSIS — F321 Major depressive disorder, single episode, moderate: Secondary | ICD-10-CM | POA: Diagnosis not present

## 2018-11-13 MED ORDER — ALPRAZOLAM 0.5 MG PO TABS: 0.5000 mg | ORAL_TABLET | Freq: Three times a day (TID) | ORAL | 3 refills | Status: DC

## 2018-11-13 MED ORDER — LAMOTRIGINE 100 MG PO TABS: ORAL_TABLET | ORAL | 3 refills | Status: DC

## 2018-11-13 MED ORDER — FLUOXETINE HCL 20 MG PO CAPS: 20.0000 mg | ORAL_CAPSULE | ORAL | 3 refills | Status: DC

## 2018-11-13 NOTE — Patient Instructions (Signed)
Try melatonin 5 to 10 mg at bedtime 

## 2018-11-13 NOTE — Progress Notes (Signed)
[facility] MD/PA/NP OP Progress Note  11/13/2018 1:19 PM Stephen Porter  MRN:  [id number]  Chief Complaint:  Chief Complaint    Anxiety; Depression; Follow-up     HPI:  This patient is a 63 year old married white male who lives with his wife in [place]. He has 2 grown daughters one son and 3 grandchildren. 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  The patient was referred by his primary physician, Dr. Brigitte Pulse, for further assessment and treatment of depression anxiety and chronic pain.  The patient states that as a younger person he never had any issues with depression. 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 In 2003 he had a neck injury and has had 3 surgeries on it and in 2005 had a back injury and had to have surgery on this as well. He stated that after the back surgery he was in a great deal of pain and dysfunction. He was definitely no longer able to work and he was very frustrated with this. He stated that the back surgeon gave him a high dosage of pain medication and he became addicted. He had to go to [facility] for detox and he claimed he was detoxed without any help with medication. A few months later he had to get on fentanyl patches for the pain.  In 2013 he was [facility] because he was so depressed and felt like nothing was worth living for. His father was declining as well and died shortly after this. While there he was started on Cymbalta Lamictal and several other medications which he no longer takes. He was followed here for a time by Dr. walker and Maurice Small for counseling. Eventually however Dr. Manuella Ghazi took over his treatment.  The patient returns after 3 months.  He states he is doing fairly well.  He still has chronic pain in his neck and back and shoulders but is having some relief with the fentanyl patch and  ibuprofen.  His mood is been stable and he has been enjoying his grandchildren as well as making wooden bowls in his shop.  He has a lot of trouble with sleeping but did not like trazodone because it made him too drowsy the next day.  I suggested he try melatonin but if it does not work to call us back.  He states that he is not been depressed and he feels like his moods have "leveled out" he denies suicidal ideation Visit Diagnosis:    ICD-10-CM   1. Current moderate episode of major depressive disorder without prior episode (HCC) F32.1 ALPRAZolam (XANAX) 0.5 MG tablet    Past Psychiatric History: Prior hospitalizations at [facility]  Past Medical History:  Past Medical History:  Diagnosis Date  . Bipolar disorder ([place])   . DDD (degenerative disc disease)   . Depression   . Hyperlipidemia   . Insomnia   . Jejunal ulcer   . Leukocytosis 04/12/2016  . Skin rash     Past Surgical History:  Procedure Laterality Date  . BACK SURGERY    . CERVICAL SPINE SURGERY     x3  . CHOLECYSTECTOMY    . COLONOSCOPY  2006  . COLONOSCOPY N/A 06/30/2014   Procedure: COLONOSCOPY;  Surgeon: Rogene Houston, MD;  Location: [facility];  Service: Endoscopy;  Laterality: N/A;  830  . LUMBAR SPINE SURGERY    . UPPER GASTROINTESTINAL ENDOSCOPY      Family Psychiatric History: see below  Family History:  Family History  Problem Relation Age of Onset  . Hypertension Sister   . Anxiety disorder Sister   . Hypertension Brother   . Hypertension Brother   . Hypertension Brother   . Dementia Mother   . Paranoid behavior Mother   . Dementia Father   . Alcohol abuse Paternal Uncle   . Drug abuse Paternal Uncle   . ADD / ADHD Neg Hx   . Depression Neg Hx   . OCD Neg Hx   . Schizophrenia Neg Hx   . Seizures Neg Hx   . Sexual abuse Neg Hx   . Physical abuse Neg Hx   . Colon cancer Neg Hx     Social History:  Social History   Socioeconomic History  .  Marital status: Married    Spouse name: Not on file  . Number of children: Not on file  . Years of education: Not on file  . Highest education level: Not on file  Occupational History  . Occupation: Scientist, research (life sciences): UNEMPLOYED  Social Needs  . Financial resource strain: Not on file  . Food insecurity:    Worry: Not on file    Inability: Not on file  . Transportation needs:    Medical: Not on file    Non-medical: Not on file  Tobacco Use  . Smoking status: Former Smoker    Packs/day: 1.00    Years: 40.00    Pack years: 40.00    Types: Cigarettes  . Smokeless tobacco: Current User  . Tobacco comment: 07-05-2016 Dip, 01-22-2017 per pt he stopped Sep 25, 2016  Substance and Sexual Activity  . Alcohol use: No    Comment: 07-05-2016 per pt no  . Drug use: No    Comment: 07-05-2016 per pt no and he stopped Marijuana 1 yr ago  . Sexual activity: Not on file  Lifestyle  . Physical activity:    Days per week: Not on file    Minutes per session: Not on file  . Stress: Not on file  Relationships  . Social connections:    Talks on phone: Not on file    Gets together: Not on file    Attends religious service: Not on file    Active member of club or organization: Not on file    Attends meetings of clubs or organizations: Not on file    Relationship status: Not on file  Other Topics Concern  . Not on file  Social History Narrative   Married with 3 children and lives locally   No regular exercise    Allergies:  Allergies  Allergen Reactions  . Elavil [Amitriptyline] Other (See Comments)    Shaky    Metabolic Disorder Labs: No results found for: HGBA1C, MPG No results found for: PROLACTIN Lab Results  Component Value Date   CHOL 272 (H) 07/04/2010   TRIG 706 (H) 07/04/2010   HDL 30 (L) 07/04/2010   CHOLHDL 9.1 Ratio 07/04/2010   VLDL NOT CALC mg/dL 07/04/2010   LDLCALC See Comment mg/dL 07/04/2010   LDLCALC  12/20/2009    UNABLE TO CALCULATE IF TRIGLYCERIDE OVER  400 mg/dL        Total Cholesterol/HDL:CHD Risk Coronary Heart Disease Risk Table  Men   Women  1/2 Average Risk   3.4   3.3  Average Risk       5.0   4.4  2 X Average Risk   9.6   7.1  3 X Average Risk  23.4   11.0        Use the calculated Patient Ratio above and the CHD Risk Table to determine the patient's CHD Risk.        ATP III CLASSIFICATION (LDL):  <100     mg/dL   Optimal  100-129  mg/dL   Near or Above                    Optimal  130-159  mg/dL   Borderline  160-189  mg/dL   High  >190     mg/dL   Very High   Lab Results  Component Value Date   TSH 0.959 02/23/2012   TSH 1.237 07/04/2010    Therapeutic Level Labs: No results found for: LITHIUM No results found for: VALPROATE No components found for:  CBMZ  Current Medications: Current Outpatient Medications  Medication Sig Dispense Refill  . ALPRAZolam (XANAX) 0.5 MG tablet Take 1 tablet (0.5 mg total) by mouth 3 (three) times daily. 90 tablet 3  . amLODipine (NORVASC) 5 MG tablet Take 5 mg by mouth at bedtime.     . baclofen (LIORESAL) 10 MG tablet Take 10 mg by mouth daily.    Marland Kitchen ezetimibe (ZETIA) 10 MG tablet Take 1 tablet by mouth daily.    . fentaNYL (DURAGESIC - DOSED MCG/HR) 50 MCG/HR Apply 50 mcg/hr topically every 3 (three) days.    Marland Kitchen FLUoxetine (PROZAC) 20 MG capsule Take 1 capsule (20 mg total) by mouth every morning. 30 capsule 3  . ibuprofen (ADVIL,MOTRIN) 200 MG tablet Take 600 mg by mouth daily as needed for headache or moderate pain.     Marland Kitchen lamoTRIgine (LAMICTAL) 100 MG tablet Taking 0.5 tablets in AM 1 tablet at Night 60 tablet 3   No current facility-administered medications for this visit.      Musculoskeletal: Strength & Muscle Tone: within normal limits Gait & Station: normal Patient leans: N/A  Psychiatric Specialty Exam: Review of Systems  Musculoskeletal: Positive for back pain and neck pain.  Psychiatric/Behavioral: The patient has insomnia.   All other  systems reviewed and are negative.   Blood pressure 122/76, pulse 75, height 5\' 6"  (1.676 m), weight 215 lb (97.5 kg), SpO2 97 %.Body mass index is 34.7 kg/m.  General Appearance: Casual, Neat and Well Groomed  Eye Contact:  Good  Speech:  Clear and Coherent  Volume:  Normal  Mood:  Euthymic  Affect:  Congruent  Thought Process:  Goal Directed  Orientation:  Full (Time, Place, and Person)  Thought Content: WDL   Suicidal Thoughts:  No  Homicidal Thoughts:  No  Memory:  Immediate;   Good Recent;   Good Remote;   Good  Judgement:  Good  Insight:  Fair  Psychomotor Activity:  Decreased  Concentration:  Concentration: Good and Attention Span: Good  Recall:  Good  Fund of Knowledge: Fair  Language: Good  Akathisia:  No  Handed:  Right  AIMS (if indicated): not done  Assets:  Communication Skills Desire for Improvement Resilience Social Support Talents/Skills  ADL's:  Intact  Cognition: WNL  Sleep:  Poor   Screenings:   Assessment and Plan: This patient is a 63 year old male with a history of depression anxiety  and mood swings.  He is doing well except for sleep.  He will continue Xanax 0.5 mg 3 times daily for anxiety, Prozac 20 mg daily for depression and Lamictal 50 mg in the morning and 100 mg at bedtime for mood stabilization.  He will try melatonin for sleep and let us know the result.  He will return to see me in 3 months   Levonne Spiller, MD 11/13/2018, 1:19 PM

## 2018-11-18 DIAGNOSIS — Z299 Encounter for prophylactic measures, unspecified: Secondary | ICD-10-CM | POA: Diagnosis not present

## 2018-11-18 DIAGNOSIS — J019 Acute sinusitis, unspecified: Secondary | ICD-10-CM | POA: Diagnosis not present

## 2018-11-18 DIAGNOSIS — Z6829 Body mass index (BMI) 29.0-29.9, adult: Secondary | ICD-10-CM | POA: Diagnosis not present

## 2018-11-18 DIAGNOSIS — F329 Major depressive disorder, single episode, unspecified: Secondary | ICD-10-CM | POA: Diagnosis not present

## 2018-11-18 DIAGNOSIS — Z87891 Personal history of nicotine dependence: Secondary | ICD-10-CM | POA: Diagnosis not present

## 2018-11-18 DIAGNOSIS — G8929 Other chronic pain: Secondary | ICD-10-CM | POA: Diagnosis not present

## 2018-11-18 DIAGNOSIS — I10 Essential (primary) hypertension: Secondary | ICD-10-CM | POA: Diagnosis not present

## 2018-12-09 ENCOUNTER — Other Ambulatory Visit: Payer: Self-pay

## 2018-12-09 ENCOUNTER — Observation Stay (HOSPITAL_COMMUNITY)
Admission: EM | Admit: 2018-12-09 | Discharge: 2018-12-10 | Disposition: A | Discharge status: Home / Self Care | Payer: PPO | Attending: Internal Medicine | Admitting: Internal Medicine

## 2018-12-09 ENCOUNTER — Emergency Department (HOSPITAL_COMMUNITY): Payer: PPO

## 2018-12-09 ENCOUNTER — Encounter (HOSPITAL_COMMUNITY): Payer: Self-pay | Admitting: Emergency Medicine

## 2018-12-09 DIAGNOSIS — R29898 Other symptoms and signs involving the musculoskeletal system: Secondary | ICD-10-CM | POA: Diagnosis not present

## 2018-12-09 DIAGNOSIS — R531 Weakness: Secondary | ICD-10-CM | POA: Diagnosis not present

## 2018-12-09 DIAGNOSIS — E785 Hyperlipidemia, unspecified: Secondary | ICD-10-CM | POA: Diagnosis not present

## 2018-12-09 DIAGNOSIS — R270 Ataxia, unspecified: Secondary | ICD-10-CM | POA: Diagnosis not present

## 2018-12-09 DIAGNOSIS — I639 Cerebral infarction, unspecified: Secondary | ICD-10-CM | POA: Diagnosis not present

## 2018-12-09 DIAGNOSIS — I10 Essential (primary) hypertension: Secondary | ICD-10-CM | POA: Insufficient documentation

## 2018-12-09 DIAGNOSIS — G894 Chronic pain syndrome: Secondary | ICD-10-CM

## 2018-12-09 DIAGNOSIS — F419 Anxiety disorder, unspecified: Secondary | ICD-10-CM | POA: Insufficient documentation

## 2018-12-09 DIAGNOSIS — F319 Bipolar disorder, unspecified: Secondary | ICD-10-CM | POA: Diagnosis not present

## 2018-12-09 DIAGNOSIS — G459 Transient cerebral ischemic attack, unspecified: Secondary | ICD-10-CM | POA: Diagnosis not present

## 2018-12-09 DIAGNOSIS — Z720 Tobacco use: Secondary | ICD-10-CM

## 2018-12-09 DIAGNOSIS — Z87891 Personal history of nicotine dependence: Secondary | ICD-10-CM

## 2018-12-09 DIAGNOSIS — Z79899 Other long term (current) drug therapy: Secondary | ICD-10-CM | POA: Insufficient documentation

## 2018-12-09 DIAGNOSIS — Z9049 Acquired absence of other specified parts of digestive tract: Secondary | ICD-10-CM | POA: Insufficient documentation

## 2018-12-09 DIAGNOSIS — F17290 Nicotine dependence, other tobacco product, uncomplicated: Secondary | ICD-10-CM | POA: Insufficient documentation

## 2018-12-09 LAB — COMPREHENSIVE METABOLIC PANEL
ALT: 34 U/L (ref 0–44)
AST: 26 U/L (ref 15–41)
Albumin: 4 g/dL (ref 3.5–5.0)
Alkaline Phosphatase: 83 U/L (ref 38–126)
Anion gap: 8 (ref 5–15)
BUN: 10 mg/dL (ref 8–23)
CHLORIDE: 104 mmol/L (ref 98–111)
CO2: 24 mmol/L (ref 22–32)
CREATININE: 1.01 mg/dL (ref 0.61–1.24)
Calcium: 9.1 mg/dL (ref 8.9–10.3)
GFR calc Af Amer: >60 mL/min (ref >60)
GFR calc non Af Amer: >60 mL/min (ref >60)
Glucose, Bld: 112 mg/dL — ABNORMAL HIGH (ref 70–99)
Potassium: 3.7 mmol/L (ref 3.5–5.1)
Sodium: 136 mmol/L (ref 135–145)
Total Bilirubin: 0.6 mg/dL (ref 0.3–1.2)
Total Protein: 7.2 g/dL (ref 6.5–8.1)

## 2018-12-09 LAB — DIFFERENTIAL
Abs Immature Granulocytes: 0.06 10*3/uL (ref 0.00–0.07)
Basophils Absolute: 0.1 10*3/uL (ref 0.0–0.1)
Basophils Relative: 0 %
Eosinophils Absolute: 0.2 10*3/uL (ref 0.0–0.5)
Eosinophils Relative: 2 %
Immature Granulocytes: 1 %
Lymphocytes Relative: 14 %
Lymphs Abs: 1.5 10*3/uL (ref 0.7–4.0)
Monocytes Absolute: 0.9 10*3/uL (ref 0.1–1.0)
Monocytes Relative: 8 %
NEUTROS PCT: 75 %
Neutro Abs: 8.6 10*3/uL — ABNORMAL HIGH (ref 1.7–7.7)

## 2018-12-09 LAB — CBC
HCT: 47 % (ref 39.0–52.0)
Hemoglobin: 15.5 g/dL (ref 13.0–17.0)
MCH: 29 pg (ref 26.0–34.0)
MCHC: 33 g/dL (ref 30.0–36.0)
MCV: 88 fL (ref 80.0–100.0)
Platelets: 236 10*3/uL (ref 150–400)
RBC: 5.34 MIL/uL (ref 4.22–5.81)
RDW: 13.4 % (ref 11.5–15.5)
WBC: 11.3 10*3/uL — ABNORMAL HIGH (ref 4.0–10.5)
nRBC: 0 % (ref 0.0–0.2)

## 2018-12-09 LAB — URINALYSIS, ROUTINE W REFLEX MICROSCOPIC
BILIRUBIN URINE: NEGATIVE
Glucose, UA: NEGATIVE mg/dL
Hgb urine dipstick: NEGATIVE
KETONES UR: NEGATIVE mg/dL
Leukocytes,Ua: NEGATIVE
NITRITE: NEGATIVE
Protein, ur: NEGATIVE mg/dL
Specific Gravity, Urine: 1.018 (ref 1.005–1.030)
pH: 7 (ref 5.0–8.0)

## 2018-12-09 LAB — RAPID URINE DRUG SCREEN, HOSP PERFORMED
Amphetamines: NOT DETECTED
Barbiturates: NOT DETECTED
Benzodiazepines: POSITIVE — AB
Cocaine: NOT DETECTED
Opiates: NOT DETECTED
Tetrahydrocannabinol: POSITIVE — AB

## 2018-12-09 LAB — CBG MONITORING, ED
GLUCOSE-CAPILLARY: 118 mg/dL — AB (ref 70–99)
Glucose-Capillary: 104 mg/dL — ABNORMAL HIGH (ref 70–99)

## 2018-12-09 LAB — ETHANOL: Alcohol, Ethyl (B): <10 mg/dL (ref <10)

## 2018-12-09 LAB — PROTIME-INR
INR: 1 (ref 0.8–1.2)
Prothrombin Time: 13.1 seconds (ref 11.4–15.2)

## 2018-12-09 LAB — APTT: APTT: 32 s (ref 24–36)

## 2018-12-09 LAB — I-STAT CREATININE, ED: Creatinine, Ser: 1.1 mg/dL (ref 0.61–1.24)

## 2018-12-09 MED ORDER — ATORVASTATIN CALCIUM 40 MG PO TABS: 40.0000 mg | ORAL_TABLET | Freq: Every day | ORAL | Status: DC

## 2018-12-09 MED ORDER — ACETAMINOPHEN 650 MG RE SUPP: 650.0000 mg | Freq: Four times a day (QID) | RECTAL | Status: DC | PRN

## 2018-12-09 MED ORDER — NICOTINE 14 MG/24HR TD PT24
14.0000 mg | MEDICATED_PATCH | Freq: Every day | TRANSDERMAL | Status: DC
  Administered 2018-12-09 – 2018-12-10 (×2): 14 mg via TRANSDERMAL
  Filled 2018-12-09 (×2): qty 1

## 2018-12-09 MED ORDER — POLYETHYLENE GLYCOL 3350 17 G PO PACK: 17.0000 g | PACK | Freq: Every day | ORAL | Status: DC | PRN

## 2018-12-09 MED ORDER — LAMOTRIGINE 100 MG PO TABS
100.0000 mg | ORAL_TABLET | Freq: Every day | ORAL | Status: DC
  Administered 2018-12-09: 100 mg via ORAL
  Filled 2018-12-09: qty 1

## 2018-12-09 MED ORDER — ALPRAZOLAM 0.5 MG PO TABS
0.5000 mg | ORAL_TABLET | Freq: Three times a day (TID) | ORAL | Status: DC | PRN
  Administered 2018-12-09 – 2018-12-10 (×3): 0.5 mg via ORAL
  Filled 2018-12-09 (×3): qty 1

## 2018-12-09 MED ORDER — LAMOTRIGINE 100 MG PO TABS
50.0000 mg | ORAL_TABLET | Freq: Every day | ORAL | Status: DC
  Administered 2018-12-10: 50 mg via ORAL
  Filled 2018-12-09: qty 1

## 2018-12-09 MED ORDER — FENTANYL 50 MCG/HR TD PT72: 1.0000 | MEDICATED_PATCH | TRANSDERMAL | Status: DC

## 2018-12-09 MED ORDER — ENOXAPARIN SODIUM 40 MG/0.4ML ~~LOC~~ SOLN
40.0000 mg | SUBCUTANEOUS | Status: DC
  Administered 2018-12-09: 40 mg via SUBCUTANEOUS
  Filled 2018-12-09: qty 0.4

## 2018-12-09 MED ORDER — LAMOTRIGINE 100 MG PO TABS: 50.0000 mg | ORAL_TABLET | Freq: Two times a day (BID) | ORAL | Status: DC

## 2018-12-09 MED ORDER — NICOTINE POLACRILEX 2 MG MT GUM: 2.0000 mg | CHEWING_GUM | OROMUCOSAL | Status: DC | PRN

## 2018-12-09 MED ORDER — ACETAMINOPHEN 325 MG PO TABS
650.0000 mg | ORAL_TABLET | Freq: Four times a day (QID) | ORAL | Status: DC | PRN
  Administered 2018-12-10: 650 mg via ORAL
  Filled 2018-12-09: qty 2

## 2018-12-09 MED ORDER — ASPIRIN 325 MG PO TABS
325.0000 mg | ORAL_TABLET | Freq: Once | ORAL | Status: AC
  Administered 2018-12-09: 325 mg via ORAL
  Filled 2018-12-09: qty 1

## 2018-12-09 MED ORDER — AMLODIPINE BESYLATE 5 MG PO TABS
5.0000 mg | ORAL_TABLET | Freq: Every day | ORAL | Status: DC
  Administered 2018-12-09: 5 mg via ORAL
  Filled 2018-12-09: qty 1

## 2018-12-09 MED ORDER — ASPIRIN EC 81 MG PO TBEC
81.0000 mg | DELAYED_RELEASE_TABLET | Freq: Every day | ORAL | Status: DC
  Administered 2018-12-10: 81 mg via ORAL
  Filled 2018-12-09: qty 1

## 2018-12-09 MED ORDER — ONDANSETRON HCL 4 MG PO TABS: 4.0000 mg | ORAL_TABLET | Freq: Four times a day (QID) | ORAL | Status: DC | PRN

## 2018-12-09 MED ORDER — FLUOXETINE HCL 20 MG PO CAPS
20.0000 mg | ORAL_CAPSULE | Freq: Every day | ORAL | Status: DC
  Administered 2018-12-10: 20 mg via ORAL
  Filled 2018-12-09: qty 1

## 2018-12-09 MED ORDER — IOPAMIDOL (ISOVUE-370) INJECTION 76%
150.0000 mL | Freq: Once | INTRAVENOUS | Status: AC | PRN
  Administered 2018-12-09: 150 mL via INTRAVENOUS

## 2018-12-09 MED ORDER — ONDANSETRON HCL 4 MG/2ML IJ SOLN: 4.0000 mg | Freq: Four times a day (QID) | INTRAMUSCULAR | Status: DC | PRN

## 2018-12-09 NOTE — Consult Note (Addendum)
Addendum: No new or acute large vessel occlusion on head and neck CTA, not a candidate for mechanical thrombectomy.    ///////////////   [facility] Tele[facility]rology Consult Services   Date of Service:   12/09/2018 14:44:52  Impression:     .  Right Hemispheric Infarct  Comments/Sign-Out: patient with history of Hyperlipidemia/Hypercholesterolemia and hypertension presents after a near syncopal event and reportedly right facial droop and walking difficulty. Head CT: No acute Intracranial abnormality. NIHSS 1 symptoms too mild and not disabling, the risks of IV tPA would outweigh the potential benefits. will need stroke work up  Mechanism of Stroke: Possible Thromboembolic Possible Cardioembolic Small Vessel Disease  Metrics: Last Known Well: 12/09/2018 12:30:00 [facility] Notification Time: 12/09/2018 14:44:52 Arrival Time: 12/09/2018 14:30:00 Stamp Time: 12/09/2018 14:44:52 Time First Login Attempt: 12/09/2018 14:50:07 Video Start Time: 12/09/2018 14:50:07  Symptoms: near syncopal event. NIHSS Start Assessment Time: 12/09/2018 15:04:02 Patient is not a candidate for tPA. Patient was not deemed candidate for tPA thrombolytics because of symptoms too mild and not disabling, the risks of IV tPA would outweigh the potential benefits.. Weight Noted by Staff: 97.5 kg Video End Time: 12/09/2018 15:11:58  CT head showed no acute hemorrhage or acute core infarct. CT head was reviewed.  Lower Likelihood of Large Vessel Occlusion but Following Stat Studies are Recommended  CTA Head and Neck.   Radiologist was not called back for review of advanced imaging because symptoms not suggestive of Large Vessel Occlusion. ED Physician notified of diagnostic impression and management plan on 12/09/2018 15:13:10  Our recommendations are outlined below.  Recommendations:     .  Activate Stroke Protocol Admission/Order Set     .  Stroke/Telemetry Floor     .   Neuro Checks     .  Bedside Swallow Eval     .  DVT Prophylaxis     .  IV Fluids, Normal Saline     .  Head of Bed Below 30 Degrees     .  Euglycemia and Avoid Hyperthermia (PRN Acetaminophen)     .  Antiplatelet Therapy Recommended     .  Stroke work up with: Noncontrast Brain MRI, head and neck MRA (or CTA) if not done in the ER, lipid panel, HbA1c (if not done in the past 3 months), Transthoracic ECHO (evaluate for Ejection fraction, wall motion abnormalities, vegetations, thrombus (left ventricle or left atrial appendage), possible PFO (if there is a PFO then venous doppler of all extremities and pelvic veins))  Routine Consultation with [facility] for Follow up Care  Sign Out:     .  Discussed with Emergency Department Provider    ------------------------------------------------------------------------------  History of Present Illness: Patient is a 63 year old Male.  Patient was brought by private transportation with symptoms of near syncopal event.  Patient with history of Hyperlipidemia/Hypercholesterolemia , hypertension last known well per patient:12:30 had a near syncopal episodes after which has been having right sided facial droop and difficulty walking.  CT head showed no acute hemorrhage or acute core infarct. CT head was reviewed.  Last seen normal was within 4.5 hours. There is no history of hemorrhagic complications or intracranial hemorrhage. There is no history of Recent Anticoagulants. There is no history of recent major surgery. There is no history of recent stroke.  Examination: 1A: Level of Consciousness - Alert; keenly responsive + 0 1B: Ask Month and Age - Both Questions Right + 0 1C: Blink Eyes & Squeeze Hands - Performs Both Tasks +  0 2: Test Horizontal Extraocular Movements - Normal + 0 3: Test Visual Fields - No Visual Loss + 0 4: Test Facial Palsy (Use Grimace if Obtunded) - Normal symmetry + 0 5A: Test Left Arm Motor Drift - No Drift for  10 Seconds + 0 5B: Test Right Arm Motor Drift - No Drift for 10 Seconds + 0 6A: Test Left Leg Motor Drift - No Drift for 5 Seconds + 0 6B: Test Right Leg Motor Drift - No Drift for 5 Seconds + 0 7: Test Limb Ataxia (FNF/Heel-Shin) - No Ataxia + 0 8: Test Sensation - Mild-Moderate Loss: Less Sharp/More Dull + 1 9: Test Language/Aphasia - Normal; No aphasia + 0 10: Test Dysarthria - Normal + 0 11: Test Extinction/Inattention - No abnormality + 0  NIHSS Score: 1  Patient was informed the Neurology Consult would happen via TeleHealth consult by way of interactive audio and video telecommunications and consented to receiving care in this manner.  Due to the immediate potential for life-threatening deterioration due to underlying acute neurologic illness, I spent 35 minutes providing critical care. This time includes time for face to face visit via telemedicine, review of medical records, imaging studies and discussion of findings with providers, the patient and/or family.   Dr Elenor Quinones   [facility] [phone]   Case [id number]

## 2018-12-09 NOTE — Progress Notes (Signed)
Beeper time = 14:38 Exam Started 14:46 Exam finished [id number] Images sent to [facility] 1447 Exam completed in [place] called [id number]

## 2018-12-09 NOTE — ED Provider Notes (Signed)
[facility] Provider Note   CSN: [id number] Arrival date & time: 12/09/18  1430    History   Chief Complaint Chief Complaint  Patient presents with  . Weakness    HPI Stephen Porter is a 63 y.o. male.  He is a history of high cholesterol.  He is presenting here after feeling acutely weak and unsteady on his feet after raking leaves.  Does not sound like he was working especially hard.  Symptom onset was at 1230.  He looked like he might pass out was listing to the left.  He was having difficulty walking.  His daughter who is a nurse thought he was having a little bit of difficulty word finding.  This is never happened before.  He denies any headache or chest pain.  No blurry vision.  Chronic pain issues and is on a Duragesic patch.  He had prior neck surgeries.     The history is provided by the patient, the spouse and a relative.  Cerebrovascular Accident  This is a new problem. Episode onset: 2.5 hours. The problem occurs constantly. The problem has not changed since onset.Pertinent negatives include no chest pain, no abdominal pain, no headaches and no shortness of breath. The symptoms are aggravated by walking. Nothing relieves the symptoms. He has tried nothing for the symptoms. The treatment provided no relief.    Past Medical History:  Diagnosis Date  . Bipolar disorder ([place])   . DDD (degenerative disc disease)   . Depression   . Hyperlipidemia   . Insomnia   . Jejunal ulcer   . Leukocytosis 04/12/2016  . Skin rash     Patient Active Problem List   Diagnosis Date Noted  . Major depression in full remission ([place]) 07/05/2016  . Leukocytosis 04/12/2016  . Insomnia due to mental disorder 09/03/2012  . Psychoactive substance-induced organic mood disorder ([place]) 02/25/2012  . Other depression due to general medical condition 02/24/2012  . HYPERLIPIDEMIA 07/05/2010  . ANXIETY 02/08/2010  . DEPRESSION 02/08/2010  . DEGENERATIVE DISC DISEASE 02/08/2010   . CROHN'S DISEASE, HX OF 02/08/2010    Past Surgical History:  Procedure Laterality Date  . BACK SURGERY    . CERVICAL SPINE SURGERY     x3  . CHOLECYSTECTOMY    . COLONOSCOPY  2006  . COLONOSCOPY N/A 06/30/2014   Procedure: COLONOSCOPY;  Surgeon: Rogene Houston, MD;  Location: [facility];  Service: Endoscopy;  Laterality: N/A;  830  . LUMBAR SPINE SURGERY    . UPPER GASTROINTESTINAL ENDOSCOPY          Home Medications    Prior to Admission medications   Medication Sig Start Date End Date Taking? Authorizing Provider  ALPRAZolam Duanne Moron) 0.5 MG tablet Take 1 tablet (0.5 mg total) by mouth 3 (three) times daily. 11/13/18   Cloria Spring, MD  amLODipine (NORVASC) 5 MG tablet Take 5 mg by mouth at bedtime.     [provider]  baclofen (LIORESAL) 10 MG tablet Take 10 mg by mouth daily. 06/11/16   [provider]  ezetimibe (ZETIA) 10 MG tablet Take 1 tablet by mouth daily. 12/01/15   [provider]  fentaNYL (DURAGESIC - DOSED MCG/HR) 50 MCG/HR Apply 50 mcg/hr topically every 3 (three) days. 08/03/12   [provider]  FLUoxetine (PROZAC) 20 MG capsule Take 1 capsule (20 mg total) by mouth every morning. 11/13/18 11/13/19  Cloria Spring, MD  ibuprofen (ADVIL,MOTRIN) 200 MG tablet Take 600  mg by mouth daily as needed for headache or moderate pain.     [provider]  lamoTRIgine (LAMICTAL) 100 MG tablet Taking 0.5 tablets in AM 1 tablet at Night 11/13/18   Cloria Spring, MD    Family History Family History  Problem Relation Age of Onset  . Hypertension Sister   . Anxiety disorder Sister   . Hypertension Brother   . Hypertension Brother   . Hypertension Brother   . Dementia Mother   . Paranoid behavior Mother   . Dementia Father   . Alcohol abuse Paternal Uncle   . Drug abuse Paternal Uncle   . ADD / ADHD Neg Hx   . Depression Neg Hx   . OCD Neg Hx   . Schizophrenia Neg Hx   . Seizures Neg Hx   . Sexual abuse Neg Hx   .  Physical abuse Neg Hx   . Colon cancer Neg Hx     Social History Social History   Tobacco Use  . Smoking status: Former Smoker    Packs/day: 1.00    Years: 40.00    Pack years: 40.00    Types: Cigarettes  . Smokeless tobacco: Current User  . Tobacco comment: 07-05-2016 Dip, 01-22-2017 per pt he stopped Sep 25, 2016  Substance Use Topics  . Alcohol use: No    Comment: 07-05-2016 per pt no  . Drug use: No    Comment: 07-05-2016 per pt no and he stopped Marijuana 1 yr ago     Allergies   Elavil [amitriptyline]   Review of Systems Review of Systems  Constitutional: Negative for fever.  HENT: Negative for sore throat.   Eyes: Negative for visual disturbance.  Respiratory: Negative for shortness of breath.   Cardiovascular: Negative for chest pain.  Gastrointestinal: Negative for abdominal pain.  Genitourinary: Negative for dysuria.  Musculoskeletal: Negative for neck pain (chronic).  Skin: Negative for rash.  Neurological: Negative for headaches.     Physical Exam Updated Vital Signs BP 117/73 (BP Location: Right Arm)   Pulse (!) 58   Temp 98.2 F (36.8 C)   Resp 17   Ht 5\' 8"  (1.727 m)   Wt 97.5 kg   SpO2 96%   BMI 32.69 kg/m   Physical Exam Vitals signs and nursing note reviewed.  Constitutional:      Appearance: He is well-developed.  HENT:     Head: Normocephalic and atraumatic.  Eyes:     Conjunctiva/sclera: Conjunctivae normal.  Neck:     Musculoskeletal: Neck supple.  Cardiovascular:     Rate and Rhythm: Normal rate and regular rhythm.     Heart sounds: No murmur.  Pulmonary:     Effort: Pulmonary effort is normal. No respiratory distress.     Breath sounds: Normal breath sounds.  Abdominal:     Palpations: Abdomen is soft.     Tenderness: There is no abdominal tenderness.  Musculoskeletal: Normal range of motion.     Right lower leg: No edema.     Left lower leg: No edema.  Skin:    General: Skin is warm and dry.     Capillary Refill:  Capillary refill takes less than 2 seconds.  Neurological:     Mental Status: He is alert.     Comments: She is awake alert following commands.  No slurred speech but seems to have a slow thought process.  Subtle right facial droop.  No pronator drift.  Very slow and unsure finger-to-nose bilaterally.  Both lower extremities slightly weak and shaky on motor exam.  Gait not tested but was unsteady on transferring from wheelchair to bed and needed assistance.      ED Treatments / Results  Labs (all labs ordered are listed, but only abnormal results are displayed) Labs Reviewed  CBC - Abnormal; Notable for the following components:      Result Value   WBC 11.3 (*)    All other components within normal limits  DIFFERENTIAL - Abnormal; Notable for the following components:   Neutro Abs 8.6 (*)    All other components within normal limits  COMPREHENSIVE METABOLIC PANEL - Abnormal; Notable for the following components:   Glucose, Bld 112 (*)    All other components within normal limits  RAPID URINE DRUG SCREEN, HOSP PERFORMED - Abnormal; Notable for the following components:   Benzodiazepines POSITIVE (*)    Tetrahydrocannabinol POSITIVE (*)    All other components within normal limits  URINALYSIS, ROUTINE W REFLEX MICROSCOPIC - Abnormal; Notable for the following components:   Color, Urine STRAW (*)    All other components within normal limits  LIPID PANEL - Abnormal; Notable for the following components:   Cholesterol 212 (*)    Triglycerides 347 (*)    HDL 34 (*)    VLDL 69 (*)    LDL Cholesterol 109 (*)    All other components within normal limits  CBG MONITORING, ED - Abnormal; Notable for the following components:   Glucose-Capillary 104 (*)    All other components within normal limits  CBG MONITORING, ED - Abnormal; Notable for the following components:   Glucose-Capillary 118 (*)    All other components within normal limits  ETHANOL  PROTIME-INR  APTT  HIV ANTIBODY  (ROUTINE TESTING W REFLEX)  HEMOGLOBIN A1C  I-STAT CREATININE, ED    EKG EKG Interpretation  Date/Time:  Tuesday December 09 2018 15:16:45 EDT Ventricular Rate:  65 PR Interval:    QRS Duration: 92 QT Interval:  423 QTC Calculation: 440 R Axis:   -59 Text Interpretation:  Sinus rhythm LAD, consider left anterior fascicular block Low voltage, precordial leads Abnormal R-wave progression, late transition similar to prior 5/16 Confirmed by Aletta Edouard [phone]) on 12/09/2018 3:25:17 PM   Radiology Ct Angio Head W Or Wo Contrast  Result Date: 12/09/2018 CLINICAL DATA:  Initial evaluation EXAM: CT ANGIOGRAPHY HEAD AND NECK TECHNIQUE: Multidetector CT imaging of the head and neck was performed using the standard protocol during bolus administration of intravenous contrast. Multiplanar CT image reconstructions and MIPs were obtained to evaluate the vascular anatomy. Carotid stenosis measurements (when applicable) are obtained utilizing NASCET criteria, using the distal internal carotid diameter as the denominator. CONTRAST:  124mL ISOVUE-370 IOPAMIDOL (ISOVUE-370) INJECTION 76% COMPARISON:  Prior CT from earlier the same day. FINDINGS: CTA NECK FINDINGS Aortic arch: Visualized aortic arch of normal caliber with normal 3 vessel morphology. Mild atheromatous plaque within the aortic arch. No flow-limiting stenosis about the origin of the great vessels. Visualized subclavian arteries widely patent. Right carotid system: Right common carotid artery widely patent from its origin to the bifurcation without stenosis. Mixed plaque about the right bifurcation/proximal right ICA widely patent distally without stenosis, dissection, or occlusion. Left carotid system: Left common carotid artery patent from its origin to the bifurcation without stenosis. Multifocal plaque about the origin of the left ICA with stenoses of up to 40% by NASCET criteria. Left ICA otherwise widely patent without stenosis, dissection, or  occlusion. Vertebral arteries: Both of  the vertebral arteries arise from the subclavian arteries. Atheromatous narrowing of up to approximately 50% at the origin of the left vertebral artery. Vertebral arteries otherwise widely patent within the neck without stenosis, dissection, or occlusion. Skeleton: No acute osseous finding. No discrete lytic or blastic osseous lesions. Prior cervical fusion at C5-C7, with anterior plate screw fixation in place at C5-6. Adjacent segment disease at C4-5 noted. Other neck: No other acute soft tissue abnormality within the neck. 14 mm heterogeneous left thyroid nodule noted, of doubtful significance. Upper chest: Visualized upper chest demonstrates no acute finding. Moderate to advanced emphysematous changes noted within the visualized lungs. Review of the MIP images confirms the above findings CTA HEAD FINDINGS Anterior circulation: Petrous segments patent bilaterally. Scattered atheromatous plaque within the cavernous and supraclinoid ICAs with mild to moderate multifocal narrowing (no more than 30-50%). ICA termini widely patent. A1 segments well perfused. Normal anterior communicating artery. Anterior cerebral arteries widely patent to their distal aspects. M1 segments widely patent bilaterally. Normal MCA bifurcations. Distal MCA branches well perfused and symmetric. Posterior circulation: Vertebral arteries widely patent to the vertebrobasilar junction. Left vertebral artery slightly dominant. Posterior inferior cerebral arteries patent bilaterally. Basilar widely patent to its distal aspect. Superior cerebral arteries patent bilaterally. Both of the posterior cerebral arteries primarily supplied via the basilar and are widely patent to their distal aspects. Venous sinuses: Patent. Anatomic variants: None significant. Delayed phase: No abnormal enhancement. Review of the MIP images confirms the above findings IMPRESSION: 1. Negative CTA for EVLO. 2. No hemodynamically  significant or high-grade/correctable stenosis. 3. Atheromatous stenoses of approximately 40% about the carotid bifurcations bilaterally. 4. 50% stenosis at the origin of the left vertebral artery. Otherwise widely patent vertebrobasilar system. 5. Emphysema. Critical Value/emergent results were called by telephone at the time of interpretation on 12/09/2018 at 3:50 pm to Dr. Aletta Edouard , who verbally acknowledged these results. Electronically Signed   By: Jeannine Boga M.D.   On: 12/09/2018 15:53   Ct Angio Neck W Or Wo Contrast  Result Date: 12/09/2018 CLINICAL DATA:  Initial evaluation EXAM: CT ANGIOGRAPHY HEAD AND NECK TECHNIQUE: Multidetector CT imaging of the head and neck was performed using the standard protocol during bolus administration of intravenous contrast. Multiplanar CT image reconstructions and MIPs were obtained to evaluate the vascular anatomy. Carotid stenosis measurements (when applicable) are obtained utilizing NASCET criteria, using the distal internal carotid diameter as the denominator. CONTRAST:  148mL ISOVUE-370 IOPAMIDOL (ISOVUE-370) INJECTION 76% COMPARISON:  Prior CT from earlier the same day. FINDINGS: CTA NECK FINDINGS Aortic arch: Visualized aortic arch of normal caliber with normal 3 vessel morphology. Mild atheromatous plaque within the aortic arch. No flow-limiting stenosis about the origin of the great vessels. Visualized subclavian arteries widely patent. Right carotid system: Right common carotid artery widely patent from its origin to the bifurcation without stenosis. Mixed plaque about the right bifurcation/proximal right ICA widely patent distally without stenosis, dissection, or occlusion. Left carotid system: Left common carotid artery patent from its origin to the bifurcation without stenosis. Multifocal plaque about the origin of the left ICA with stenoses of up to 40% by NASCET criteria. Left ICA otherwise widely patent without stenosis, dissection, or  occlusion. Vertebral arteries: Both of the vertebral arteries arise from the subclavian arteries. Atheromatous narrowing of up to approximately 50% at the origin of the left vertebral artery. Vertebral arteries otherwise widely patent within the neck without stenosis, dissection, or occlusion. Skeleton: No acute osseous finding. No discrete lytic or blastic osseous  lesions. Prior cervical fusion at C5-C7, with anterior plate screw fixation in place at C5-6. Adjacent segment disease at C4-5 noted. Other neck: No other acute soft tissue abnormality within the neck. 14 mm heterogeneous left thyroid nodule noted, of doubtful significance. Upper chest: Visualized upper chest demonstrates no acute finding. Moderate to advanced emphysematous changes noted within the visualized lungs. Review of the MIP images confirms the above findings CTA HEAD FINDINGS Anterior circulation: Petrous segments patent bilaterally. Scattered atheromatous plaque within the cavernous and supraclinoid ICAs with mild to moderate multifocal narrowing (no more than 30-50%). ICA termini widely patent. A1 segments well perfused. Normal anterior communicating artery. Anterior cerebral arteries widely patent to their distal aspects. M1 segments widely patent bilaterally. Normal MCA bifurcations. Distal MCA branches well perfused and symmetric. Posterior circulation: Vertebral arteries widely patent to the vertebrobasilar junction. Left vertebral artery slightly dominant. Posterior inferior cerebral arteries patent bilaterally. Basilar widely patent to its distal aspect. Superior cerebral arteries patent bilaterally. Both of the posterior cerebral arteries primarily supplied via the basilar and are widely patent to their distal aspects. Venous sinuses: Patent. Anatomic variants: None significant. Delayed phase: No abnormal enhancement. Review of the MIP images confirms the above findings IMPRESSION: 1. Negative CTA for EVLO. 2. No hemodynamically  significant or high-grade/correctable stenosis. 3. Atheromatous stenoses of approximately 40% about the carotid bifurcations bilaterally. 4. 50% stenosis at the origin of the left vertebral artery. Otherwise widely patent vertebrobasilar system. 5. Emphysema. Critical Value/emergent results were called by telephone at the time of interpretation on 12/09/2018 at 3:50 pm to Dr. Aletta Edouard , who verbally acknowledged these results. Electronically Signed   By: Jeannine Boga M.D.   On: 12/09/2018 15:53   Mr Brain Wo Contrast  Result Date: 12/09/2018 CLINICAL DATA:  Initial evaluation for acute stroke. Right-sided weakness. EXAM: MRI HEAD WITHOUT CONTRAST TECHNIQUE: Multiplanar, multiecho pulse sequences of the brain and surrounding structures were obtained without intravenous contrast. COMPARISON:  Prior CT and CTA from earlier same day. FINDINGS: Brain: Generalized age-related cerebral atrophy. Minimal T2/FLAIR hyperintensity within the periventricular and subcortical white matter both cerebral hemispheres, nonspecific, but felt to be within normal limits for age. No abnormal foci of restricted diffusion to suggest acute or subacute ischemia. Gray-white matter differentiation maintained. No encephalomalacia to suggest chronic cortical infarction. No evidence for acute or chronic intracranial hemorrhage. No mass lesion, midline shift or mass effect. No hydrocephalus. No extra-axial fluid collection. Pituitary gland suprasellar region normal. Midline structures intact. Vascular: Major intracranial vascular flow voids are maintained. Skull and upper cervical spine: Craniocervical junction normal. Upper cervical spine within normal limits. Bone marrow signal intensity normal. No scalp soft tissue abnormality. Sinuses/Orbits: Globes normal soft tissues demonstrate no acute finding. Remote posttraumatic defect noted at the right lamina papyracea. Paranasal sinuses are clear. Left mastoid effusion with fluid  density within the left middle ear cavity. Inner ear structures grossly normal. Other: None. IMPRESSION: 1. Negative brain MRI. No acute intracranial infarct or other abnormality identified. 2. Left mastoid and middle ear effusion, indeterminate. Clinical correlation for possible otomastoiditis recommended. Electronically Signed   By: Jeannine Boga M.D.   On: 12/09/2018 16:07   Ct Head Code Stroke Wo Contrast  Result Date: 12/09/2018 CLINICAL DATA:  Code stroke.  Ataxia.  Rule out stroke. EXAM: CT HEAD WITHOUT CONTRAST TECHNIQUE: Contiguous axial images were obtained from the base of the skull through the vertex without intravenous contrast. COMPARISON:  MRI head 12/20/2009 FINDINGS: Brain: Mild atrophy. Negative for hydrocephalus. Negative for acute  infarct. Negative for acute hemorrhage or mass. Vascular: Negative for hyperdense vessel Skull: Negative Sinuses/Orbits: Negative Other: None ASPECTS ([place] Stroke Program Early CT Score) - Ganglionic level infarction (caudate, lentiform nuclei, internal capsule, insula, M1-M3 cortex): 7 - Supraganglionic infarction (M4-M6 cortex): 3 Total score (0-10 with 10 being normal): 10 IMPRESSION: 1. No acute abnormality 2. ASPECTS is 10 3. These results were called by telephone at the time of interpretation on 12/09/2018 at 2:57 pm to Dr. Aletta Edouard , who verbally acknowledged these results. Electronically Signed   By: Franchot Gallo M.D.   On: 12/09/2018 14:57    Procedures .Critical Care Performed by: Hayden Rasmussen, MD Authorized by: Hayden Rasmussen, MD   Critical care provider statement:    Critical care time (minutes):  45   Critical care time was exclusive of:  Separately billable procedures and treating other patients   Critical care was necessary to treat or prevent imminent or life-threatening deterioration of the following conditions:  CNS failure or compromise   Critical care was time spent personally by me on the following  activities:  Discussions with consultants, evaluation of patient's response to treatment, examination of patient, ordering and performing treatments and interventions, ordering and review of laboratory studies, ordering and review of radiographic studies, pulse oximetry, re-evaluation of patient's condition, obtaining history from patient or surrogate, review of old charts and development of treatment plan with patient or surrogate   I assumed direction of critical care for this patient from another provider in my specialty: no     (including critical care time)  Medications Ordered in ED Medications - No data to display   Initial Impression / Assessment and Plan / ED Course  I have reviewed the triage vital signs and the nursing notes.  Pertinent labs & imaging results that were available during my care of the patient were reviewed by me and considered in my medical decision making (see chart for details).  Clinical Course as of Dec 10 951  Tue Dec 09, 2018  1447 Patient with acute onset of weakness shakiness and unsteadiness at 12:30 PM today.  Possible stroke and so activated code stroke.  Not on anticoagulation.   [MB]  [id number] Tele-neurology evaluated the patient and they do feel he has had a small stroke with some right-sided facial droop and some arm weakness and numbness.  They are not recommending TPA for him.  They do recommend an aspirin and admission for further medical work-up   [MB]  1536 Differential includes CVA, hemorrhagic bleed, hypoglycemia, myocardial ischemia, arrhythmia, metabolic derangement.  I reviewed all patient's imaging independently that has resulted during my time caring for him, along with the ecg and labs.    [MB]  4503 CTA head and neck along with MRI negative.  Discussed with medicine hospitalist who will evaluate the patient for admission and further work-up.   [MB]    Clinical Course User Index [MB] Hayden Rasmussen, MD       Final Clinical  Impressions(s) / ED Diagnoses   Final diagnoses:  Acute CVA (cerebrovascular accident) [facility])    ED Discharge Orders    None       Hayden Rasmussen, MD 12/10/18 [phone]

## 2018-12-09 NOTE — ED Notes (Signed)
Unable to obtain VS or neuro checks at this time d/t pt being in MRI.

## 2018-12-09 NOTE — ED Notes (Signed)
Pt has 50 mcg Fentanyl patch to RUQ.

## 2018-12-09 NOTE — ED Notes (Signed)
Teleoneuro placed at bedside.

## 2018-12-09 NOTE — ED Triage Notes (Signed)
Pt had episode while outside today at1230  Felt like he was leaning over to the left side and became weak.  Pt was given orange juice by wife.  Pt had some left leg weakness per family and was not acting his normal self.

## 2018-12-09 NOTE — ED Notes (Signed)
No TPA decision at this time.

## 2018-12-09 NOTE — ED Notes (Signed)
Pt in MRI at this time 

## 2018-12-09 NOTE — ED Notes (Addendum)
ED TO INPATIENT HANDOFF REPORT  ED Nurse Name and Phone #: Judson Roch [phone]  S Name/Age/Gender Stephen Porter 63 y.o. male Room/Bed: APA09/APA09  Code Status   Code Status: Not on file  Home/SNF/Other Home Patient oriented to: self, place, time and situation Is this baseline? Yes   Triage Complete: Triage complete  Chief Complaint POSS STROKE  Triage Note Pt had episode while outside today at1230  Felt like he was leaning over to the left side and became weak.  Pt was given orange juice by wife.  Pt had some left leg weakness per family and was not acting his normal self.     Allergies Allergies  Allergen Reactions  . Elavil [Amitriptyline] Other (See Comments)    Shaky    Level of Care/Admitting Diagnosis ED Disposition    ED Disposition Condition [place]: [facility] [[id number]]  Level of Care: Telemetry [5]  Diagnosis: Left-sided weakness [275170]  Admitting Physician: Bethena Roys [[id number]]  Attending Physician: Bethena Roys [phone]  PT Class (Do Not Modify): Observation [104]  PT Acc Code (Do Not Modify): Observation [10022]       B Medical/Surgery History Past Medical History:  Diagnosis Date  . Bipolar disorder ([place])   . DDD (degenerative disc disease)   . Depression   . Hyperlipidemia   . Insomnia   . Jejunal ulcer   . Leukocytosis 04/12/2016  . Skin rash    Past Surgical History:  Procedure Laterality Date  . BACK SURGERY    . CERVICAL SPINE SURGERY     x3  . CHOLECYSTECTOMY    . COLONOSCOPY  2006  . COLONOSCOPY N/A 06/30/2014   Procedure: COLONOSCOPY;  Surgeon: Rogene Houston, MD;  Location: [facility];  Service: Endoscopy;  Laterality: N/A;  830  . LUMBAR SPINE SURGERY    . UPPER GASTROINTESTINAL ENDOSCOPY       A IV Location/Drains/Wounds Patient Lines/Drains/Airways Status   Active Line/Drains/Airways    Name:   Placement date:   Placement time:   Site:   Days:   Peripheral IV 12/09/18  Left Antecubital   12/09/18    1435    Antecubital   less than 1          Intake/Output Last 24 hours No intake or output data in the 24 hours ending 12/09/18 1606  Labs/Imaging Results for orders placed or performed during the hospital encounter of 12/09/18 (from the past 48 hour(s))  CBG monitoring, ED     Status: Abnormal   Collection Time: 12/09/18  2:39 PM  Result Value Ref Range   Glucose-Capillary 104 (H) 70 - 99 mg/dL  Ethanol     Status: None   Collection Time: 12/09/18  2:40 PM  Result Value Ref Range   Alcohol, Ethyl (B) <10 <10 mg/dL    Comment: (NOTE) Lowest detectable limit for serum alcohol is 10 mg/dL. For medical purposes only. Performed at [facility], [address], [place], [place] [address]   Protime-INR     Status: None   Collection Time: 12/09/18  2:40 PM  Result Value Ref Range   Prothrombin Time 13.1 11.4 - 15.2 seconds   INR 1.0 0.8 - 1.2    Comment: (NOTE) INR goal varies based on device and disease states. Performed at [facility], [address] [place], [place] [address]   APTT     Status: None   Collection Time: 12/09/18  2:40 PM  Result  Value Ref Range   aPTT 32 24 - 36 seconds    Comment: Performed at [facility], [address] [place], [place] [address]  CBC     Status: Abnormal   Collection Time: 12/09/18  2:40 PM  Result Value Ref Range   WBC 11.3 (H) 4.0 - 10.5 K/uL   RBC 5.34 4.22 - 5.81 MIL/uL   Hemoglobin 15.5 13.0 - 17.0 g/dL   HCT 47.0 39.0 - 52.0 %   MCV 88.0 80.0 - 100.0 fL   MCH 29.0 26.0 - 34.0 pg   MCHC 33.0 30.0 - 36.0 g/dL   RDW 13.4 11.5 - 15.5 %   Platelets 236 150 - 400 K/uL   nRBC 0.0 0.0 - 0.2 %    Comment: Performed at [facility], [address], [place], [place] [address]  Differential     Status: Abnormal   Collection Time: 12/09/18  2:40 PM  Result Value Ref Range   Neutrophils Relative % 75 %   Neutro Abs 8.6 (H) 1.7 - 7.7 K/uL   Lymphocytes Relative 14 %   Lymphs Abs 1.5 0.7 - 4.0 K/uL    Monocytes Relative 8 %   Monocytes Absolute 0.9 0.1 - 1.0 K/uL   Eosinophils Relative 2 %   Eosinophils Absolute 0.2 0.0 - 0.5 K/uL   Basophils Relative 0 %   Basophils Absolute 0.1 0.0 - 0.1 K/uL   Immature Granulocytes 1 %   Abs Immature Granulocytes 0.06 0.00 - 0.07 K/uL    Comment: Performed at [facility], [address] [place], [place] [address]  Comprehensive metabolic panel     Status: Abnormal   Collection Time: 12/09/18  2:40 PM  Result Value Ref Range   Sodium 136 135 - 145 mmol/L   Potassium 3.7 3.5 - 5.1 mmol/L   Chloride 104 98 - 111 mmol/L   CO2 24 22 - 32 mmol/L   Glucose, Bld 112 (H) 70 - 99 mg/dL   BUN 10 8 - 23 mg/dL   Creatinine, Ser 1.01 0.61 - 1.24 mg/dL   Calcium 9.1 8.9 - 10.3 mg/dL   Total Protein 7.2 6.5 - 8.1 g/dL   Albumin 4.0 3.5 - 5.0 g/dL   AST 26 15 - 41 U/L   ALT 34 0 - 44 U/L   Alkaline Phosphatase 83 38 - 126 U/L   Total Bilirubin 0.6 0.3 - 1.2 mg/dL   GFR calc non Af Amer >60 >60 mL/min   GFR calc Af Amer >60 >60 mL/min   Anion gap 8 5 - 15    Comment: Performed at [facility], [address], [place], [place] [address]  CBG monitoring, ED     Status: Abnormal   Collection Time: 12/09/18  2:46 PM  Result Value Ref Range   Glucose-Capillary 118 (H) 70 - 99 mg/dL  I-stat Creatinine, ED     Status: None   Collection Time: 12/09/18  2:53 PM  Result Value Ref Range   Creatinine, Ser 1.10 0.61 - 1.24 mg/dL  Urine rapid drug screen (hosp performed)     Status: Abnormal   Collection Time: 12/09/18  3:20 PM  Result Value Ref Range   Opiates NONE DETECTED NONE DETECTED   Cocaine NONE DETECTED NONE DETECTED   Benzodiazepines POSITIVE (A) NONE DETECTED   Amphetamines NONE DETECTED NONE DETECTED   Tetrahydrocannabinol POSITIVE (A) NONE DETECTED   Barbiturates NONE DETECTED NONE DETECTED    Comment: (NOTE) DRUG SCREEN FOR MEDICAL PURPOSES ONLY.  IF CONFIRMATION IS NEEDED FOR ANY PURPOSE, NOTIFY LAB WITHIN 5 DAYS. LOWEST DETECTABLE  LIMITS FOR URINE DRUG SCREEN Drug Class                     Cutoff (ng/mL) Amphetamine and metabolites    1000 Barbiturate and metabolites    200 Benzodiazepine                 765 Tricyclics and metabolites     300 Opiates and metabolites        300 Cocaine and metabolites        300 THC                            50 Performed at [facility], [address], [place], [place] [address]   Urinalysis, Routine w reflex microscopic     Status: Abnormal   Collection Time: 12/09/18  3:20 PM  Result Value Ref Range   Color, Urine STRAW (A) YELLOW   APPearance CLEAR CLEAR   Specific Gravity, Urine 1.018 1.005 - 1.030   pH 7.0 5.0 - 8.0   Glucose, UA NEGATIVE NEGATIVE mg/dL   Hgb urine dipstick NEGATIVE NEGATIVE   Bilirubin Urine NEGATIVE NEGATIVE   Ketones, ur NEGATIVE NEGATIVE mg/dL   Protein, ur NEGATIVE NEGATIVE mg/dL   Nitrite NEGATIVE NEGATIVE   Leukocytes,Ua NEGATIVE NEGATIVE    Comment: Performed at [facility], [address] [place], [place] [address]   Ct Angio Head W Or Wo Contrast  Result Date: 12/09/2018 CLINICAL DATA:  Initial evaluation EXAM: CT ANGIOGRAPHY HEAD AND NECK TECHNIQUE: Multidetector CT imaging of the head and neck was performed using the standard protocol during bolus administration of intravenous contrast. Multiplanar CT image reconstructions and MIPs were obtained to evaluate the vascular anatomy. Carotid stenosis measurements (when applicable) are obtained utilizing NASCET criteria, using the distal internal carotid diameter as the denominator. CONTRAST:  131mL ISOVUE-370 IOPAMIDOL (ISOVUE-370) INJECTION 76% COMPARISON:  Prior CT from earlier the same day. FINDINGS: CTA NECK FINDINGS Aortic arch: Visualized aortic arch of normal caliber with normal 3 vessel morphology. Mild atheromatous plaque within the aortic arch. No flow-limiting stenosis about the origin of the great vessels. Visualized subclavian arteries widely patent. Right carotid system: Right common  carotid artery widely patent from its origin to the bifurcation without stenosis. Mixed plaque about the right bifurcation/proximal right ICA widely patent distally without stenosis, dissection, or occlusion. Left carotid system: Left common carotid artery patent from its origin to the bifurcation without stenosis. Multifocal plaque about the origin of the left ICA with stenoses of up to 40% by NASCET criteria. Left ICA otherwise widely patent without stenosis, dissection, or occlusion. Vertebral arteries: Both of the vertebral arteries arise from the subclavian arteries. Atheromatous narrowing of up to approximately 50% at the origin of the left vertebral artery. Vertebral arteries otherwise widely patent within the neck without stenosis, dissection, or occlusion. Skeleton: No acute osseous finding. No discrete lytic or blastic osseous lesions. Prior cervical fusion at C5-C7, with anterior plate screw fixation in place at C5-6. Adjacent segment disease at C4-5 noted. Other neck: No other acute soft tissue abnormality within the neck. 14 mm heterogeneous left thyroid nodule noted, of doubtful significance. Upper chest: Visualized upper chest demonstrates no acute finding. Moderate to advanced emphysematous changes noted within the visualized lungs. Review of the MIP images confirms the above findings CTA HEAD FINDINGS Anterior circulation: Petrous segments patent bilaterally.  Scattered atheromatous plaque within the cavernous and supraclinoid ICAs with mild to moderate multifocal narrowing (no more than 30-50%). ICA termini widely patent. A1 segments well perfused. Normal anterior communicating artery. Anterior cerebral arteries widely patent to their distal aspects. M1 segments widely patent bilaterally. Normal MCA bifurcations. Distal MCA branches well perfused and symmetric. Posterior circulation: Vertebral arteries widely patent to the vertebrobasilar junction. Left vertebral artery slightly dominant. Posterior  inferior cerebral arteries patent bilaterally. Basilar widely patent to its distal aspect. Superior cerebral arteries patent bilaterally. Both of the posterior cerebral arteries primarily supplied via the basilar and are widely patent to their distal aspects. Venous sinuses: Patent. Anatomic variants: None significant. Delayed phase: No abnormal enhancement. Review of the MIP images confirms the above findings IMPRESSION: 1. Negative CTA for EVLO. 2. No hemodynamically significant or high-grade/correctable stenosis. 3. Atheromatous stenoses of approximately 40% about the carotid bifurcations bilaterally. 4. 50% stenosis at the origin of the left vertebral artery. Otherwise widely patent vertebrobasilar system. 5. Emphysema. Critical Value/emergent results were called by telephone at the time of interpretation on 12/09/2018 at 3:50 pm to Dr. Aletta Edouard , who verbally acknowledged these results. Electronically Signed   By: Jeannine Boga M.D.   On: 12/09/2018 15:53   Ct Angio Neck W Or Wo Contrast  Result Date: 12/09/2018 CLINICAL DATA:  Initial evaluation EXAM: CT ANGIOGRAPHY HEAD AND NECK TECHNIQUE: Multidetector CT imaging of the head and neck was performed using the standard protocol during bolus administration of intravenous contrast. Multiplanar CT image reconstructions and MIPs were obtained to evaluate the vascular anatomy. Carotid stenosis measurements (when applicable) are obtained utilizing NASCET criteria, using the distal internal carotid diameter as the denominator. CONTRAST:  126mL ISOVUE-370 IOPAMIDOL (ISOVUE-370) INJECTION 76% COMPARISON:  Prior CT from earlier the same day. FINDINGS: CTA NECK FINDINGS Aortic arch: Visualized aortic arch of normal caliber with normal 3 vessel morphology. Mild atheromatous plaque within the aortic arch. No flow-limiting stenosis about the origin of the great vessels. Visualized subclavian arteries widely patent. Right carotid system: Right common carotid  artery widely patent from its origin to the bifurcation without stenosis. Mixed plaque about the right bifurcation/proximal right ICA widely patent distally without stenosis, dissection, or occlusion. Left carotid system: Left common carotid artery patent from its origin to the bifurcation without stenosis. Multifocal plaque about the origin of the left ICA with stenoses of up to 40% by NASCET criteria. Left ICA otherwise widely patent without stenosis, dissection, or occlusion. Vertebral arteries: Both of the vertebral arteries arise from the subclavian arteries. Atheromatous narrowing of up to approximately 50% at the origin of the left vertebral artery. Vertebral arteries otherwise widely patent within the neck without stenosis, dissection, or occlusion. Skeleton: No acute osseous finding. No discrete lytic or blastic osseous lesions. Prior cervical fusion at C5-C7, with anterior plate screw fixation in place at C5-6. Adjacent segment disease at C4-5 noted. Other neck: No other acute soft tissue abnormality within the neck. 14 mm heterogeneous left thyroid nodule noted, of doubtful significance. Upper chest: Visualized upper chest demonstrates no acute finding. Moderate to advanced emphysematous changes noted within the visualized lungs. Review of the MIP images confirms the above findings CTA HEAD FINDINGS Anterior circulation: Petrous segments patent bilaterally. Scattered atheromatous plaque within the cavernous and supraclinoid ICAs with mild to moderate multifocal narrowing (no more than 30-50%). ICA termini widely patent. A1 segments well perfused. Normal anterior communicating artery. Anterior cerebral arteries widely patent to their distal aspects. M1 segments widely patent bilaterally. Normal MCA  bifurcations. Distal MCA branches well perfused and symmetric. Posterior circulation: Vertebral arteries widely patent to the vertebrobasilar junction. Left vertebral artery slightly dominant. Posterior inferior  cerebral arteries patent bilaterally. Basilar widely patent to its distal aspect. Superior cerebral arteries patent bilaterally. Both of the posterior cerebral arteries primarily supplied via the basilar and are widely patent to their distal aspects. Venous sinuses: Patent. Anatomic variants: None significant. Delayed phase: No abnormal enhancement. Review of the MIP images confirms the above findings IMPRESSION: 1. Negative CTA for EVLO. 2. No hemodynamically significant or high-grade/correctable stenosis. 3. Atheromatous stenoses of approximately 40% about the carotid bifurcations bilaterally. 4. 50% stenosis at the origin of the left vertebral artery. Otherwise widely patent vertebrobasilar system. 5. Emphysema. Critical Value/emergent results were called by telephone at the time of interpretation on 12/09/2018 at 3:50 pm to Dr. Aletta Edouard , who verbally acknowledged these results. Electronically Signed   By: Jeannine Boga M.D.   On: 12/09/2018 15:53   Ct Head Code Stroke Wo Contrast  Result Date: 12/09/2018 CLINICAL DATA:  Code stroke.  Ataxia.  Rule out stroke. EXAM: CT HEAD WITHOUT CONTRAST TECHNIQUE: Contiguous axial images were obtained from the base of the skull through the vertex without intravenous contrast. COMPARISON:  MRI head 12/20/2009 FINDINGS: Brain: Mild atrophy. Negative for hydrocephalus. Negative for acute infarct. Negative for acute hemorrhage or mass. Vascular: Negative for hyperdense vessel Skull: Negative Sinuses/Orbits: Negative Other: None ASPECTS ([place] Stroke Program Early CT Score) - Ganglionic level infarction (caudate, lentiform nuclei, internal capsule, insula, M1-M3 cortex): 7 - Supraganglionic infarction (M4-M6 cortex): 3 Total score (0-10 with 10 being normal): 10 IMPRESSION: 1. No acute abnormality 2. ASPECTS is 10 3. These results were called by telephone at the time of interpretation on 12/09/2018 at 2:57 pm to Dr. Aletta Edouard , who verbally acknowledged  these results. Electronically Signed   By: Franchot Gallo M.D.   On: 12/09/2018 14:57    Pending Labs [employer] (From admission, onward)    Start     Ordered   Signed and Held  HIV antibody (Routine Testing)  Once,   R     Signed and Held   Signed and Held  Lipid panel  Tomorrow morning,   R     Signed and Held   Signed and Held  Hemoglobin A1c  Tomorrow morning,   R     Signed and Held          Vitals/Pain Today's Vitals   12/09/18 1437 12/09/18 1500 12/09/18 1515 12/09/18 1555  BP:  135/76 (!) 137/94 (!) 149/95  Pulse:  78 67 67  Resp:  12 11 12   SpO2:  97% 100% 100%  Weight: 97.5 kg     Height: 5\' 8"  (1.727 m)     PainSc: 0-No pain       Isolation Precautions No active isolations  Medications Medications  iopamidol (ISOVUE-370) 76 % injection 150 mL (150 mLs Intravenous Contrast Given 12/09/18 1449)  aspirin tablet 325 mg (325 mg Oral Given 12/09/18 1558)    Mobility walks Low fall risk   Focused Assessments Neuro Assessment Handoff:  Swallow screen pass? Yes    NIH Stroke Scale ( + Modified Stroke Scale Criteria)  Interval: Initial Level of Consciousness (1a.)   : Alert, keenly responsive LOC Questions (1b. )   +: Answers both questions correctly LOC Commands (1c. )   + : Performs both tasks correctly Best Gaze (2. )  +: Normal Visual (3. )  +:  No visual loss Facial Palsy (4. )    : Minor paralysis(R side) Motor Arm, Left (5a. )   +: No drift Motor Arm, Right (5b. )   +: No drift Motor Leg, Left (6a. )   +: No drift Motor Leg, Right (6b. )   +: No drift Limb Ataxia (7. ): Absent Sensory (8. )   +: Mild-to-moderate sensory loss, patient feels pinprick is less sharp or is dull on the affected side, or there is a loss of superficial pain with pinprick, but patient is aware of being touched Best Language (9. )   +: No aphasia Dysarthria (10. ): Normal Extinction/Inattention (11.)   +: No Abnormality Modified SS Total  +: 1 Complete NIHSS TOTAL:  2 Last date known well: 12/09/18 Last time known well: 1230 Neuro Assessment:   Neuro Checks:   Initial (12/09/18 1435)  Last Documented NIHSS Modified Score: 1 (12/09/18 1554) Has TPA been given? No If patient is a Neuro Trauma and patient is going to OR before floor call report to [place] nurse: [phone] or [phone]     R Recommendations: See Admitting Provider Note  Report given to: 300 RN  Additional Notes:

## 2018-12-09 NOTE — H&P (Addendum)
History and Physical    Stephen Porter:[id number] DOB: August 18, 1956 DOA: 12/09/2018  PCP: Monico Blitz, MD   Patient coming from: Home  I have personally briefly reviewed patient's old medical records in [place]  Chief Complaint: Left leg weakness  HPI: Stephen Porter is a 63 y.o. male with medical history significant for depression, anxiety, bipolar disorder, who presented to the ED with reports of weakness of his left leg.  Patient was doing well on 11/05/1928 today when he was outside suddenly he became lightheaded, like he would pass out, he noted some heaviness/weakness in his left lower extremity.  Family noted he was leaning to the left with difficulty walking.  Also noted was some slight right facial droop.  No vision change, no voice change.  No slurring of speech, but daughter reports patient was having some mild delay in responding to her questions. At the time of my evaluation dizziness/lightheadedness has improved but not completely resolved.  He has persistent weakness of his left lower extremity that has slightly improved. He has chronic neck and back pain,  reqiuring surgeries both of which are unchanged.  He does not take daily aspirin.  He has had bad reaction with myalgias to statins-Simvastatin in the past. Daughter is an ER nurse here at [facility], she reports patient has abnormal cholesterol, with especially high triglycerides, supposed to be on Zetia but he has not been taking this medication.  Patient about 3 weeks ago was treated for left ear infection with Augmentin, ports ongoing ringing and fullness in left ear.  He is supposed to follow-up with ENT in a few weeks.  ED Course: Systolic blood pressure 332R to 150s.  Remarkable CMP, mild leukocytosis 11.3, unremarkable blood alcohol level.  Positive for benzos and cannabinoids.  CT negative for acute abnormality.  Telemetry neuro consulted-TPA not recommended as symptoms are mild, risk outweighs benefit,  CTA head and neck brain MRI and full stroke work-up with neurology consult inpatient recommended.  Aspirin 325 given in the ED. Hospitalist to admit for stroke work-up.  Review of Systems: As per HPI all other systems reviewed and negative.  Past Medical History:  Diagnosis Date  . Bipolar disorder ([place])   . DDD (degenerative disc disease)   . Depression   . Hyperlipidemia   . Insomnia   . Jejunal ulcer   . Leukocytosis 04/12/2016  . Skin rash     Past Surgical History:  Procedure Laterality Date  . BACK SURGERY    . CERVICAL SPINE SURGERY     x3  . CHOLECYSTECTOMY    . COLONOSCOPY  2006  . COLONOSCOPY N/A 06/30/2014   Procedure: COLONOSCOPY;  Surgeon: Rogene Houston, MD;  Location: [facility];  Service: Endoscopy;  Laterality: N/A;  830  . LUMBAR SPINE SURGERY    . UPPER GASTROINTESTINAL ENDOSCOPY       reports that he has quit smoking. His smoking use included cigarettes. He has a 40.00 pack-year smoking history. He uses smokeless tobacco. He reports that he does not drink alcohol or use drugs.  Allergies  Allergen Reactions  . Elavil [Amitriptyline] Other (See Comments)    Shaky    Family History  Problem Relation Age of Onset  . Hypertension Sister   . Anxiety disorder Sister   . Hypertension Brother   . Hypertension Brother   . Hypertension Brother   . Dementia Mother   . Paranoid behavior Mother   . Dementia Father   .  Alcohol abuse Paternal Uncle   . Drug abuse Paternal Uncle   . ADD / ADHD Neg Hx   . Depression Neg Hx   . OCD Neg Hx   . Schizophrenia Neg Hx   . Seizures Neg Hx   . Sexual abuse Neg Hx   . Physical abuse Neg Hx   . Colon cancer Neg Hx     Prior to Admission medications   Medication Sig Start Date End Date Taking? Authorizing Provider  ALPRAZolam Duanne Moron) 0.5 MG tablet Take 1 tablet (0.5 mg total) by mouth 3 (three) times daily. 11/13/18   Cloria Spring, MD  amLODipine (NORVASC) 5 MG tablet Take 5 mg by mouth at bedtime.      [provider]  baclofen (LIORESAL) 10 MG tablet Take 10 mg by mouth daily. 06/11/16   [provider]  ezetimibe (ZETIA) 10 MG tablet Take 1 tablet by mouth daily. 12/01/15   [provider]  fentaNYL (DURAGESIC - DOSED MCG/HR) 50 MCG/HR Apply 50 mcg/hr topically every 3 (three) days. 08/03/12   [provider]  FLUoxetine (PROZAC) 20 MG capsule Take 1 capsule (20 mg total) by mouth every morning. 11/13/18 11/13/19  Cloria Spring, MD  ibuprofen (ADVIL,MOTRIN) 200 MG tablet Take 600 mg by mouth daily as needed for headache or moderate pain.     [provider]  lamoTRIgine (LAMICTAL) 100 MG tablet Taking 0.5 tablets in AM 1 tablet at Night 11/13/18   Cloria Spring, MD    Physical Exam: Vitals:   12/09/18 1437 12/09/18 1500 12/09/18 1515  BP:  135/76 (!) 137/94  Pulse:  78 67  Resp:  12 11  SpO2:  97% 100%  Weight: 97.5 kg    Height: 5\' 8"  (1.727 m)      Constitutional: NAD, calm, comfortable Vitals:   12/09/18 1437 12/09/18 1500 12/09/18 1515  BP:  135/76 (!) 137/94  Pulse:  78 67  Resp:  12 11  SpO2:  97% 100%  Weight: 97.5 kg    Height: 5\' 8"  (1.727 m)     Eyes: PERRL, lids and conjunctivae normal ENMT: Mucous membranes are moist. Posterior pharynx clear of any exudate or lesions. Neck: normal, supple, no masses, no thyromegaly Respiratory: clear to auscultation bilaterally, no wheezing, no crackles. Normal respiratory effort. No accessory muscle use.  Cardiovascular: Regular rate and rhythm, no murmurs / rubs / gallops. No extremity edema. 2+ pedal pulses.   Abdomen: no tenderness, no masses palpated. No hepatosplenomegaly. Bowel sounds positive.  Musculoskeletal: no clubbing / cyanosis. No joint deformity upper and lower extremities. Good ROM, no contractures. Normal muscle tone.  Skin: no rashes, lesions, ulcers. No induration Neurologic: No facial droop, CN 2-12 grossly intact. Sensation intact-, upper and lower extremities,  finger-to-nose test  slow but normal co-ordination in both upper extremities, more heel-to-shin test bilateral lower extremities.  Strength 3-4 /5-left lower extremity, can lift against gravity  But cannot sustain movement against gravity, otherwise 5/5 in all extremities. Psychiatric: Normal judgment and insight. Alert and oriented x 3. Normal mood.   Labs on Admission: I have personally reviewed following labs and imaging studies  CBC: Recent Labs  Lab 12/09/18 1440  WBC 11.3*  NEUTROABS 8.6*  HGB 15.5  HCT 47.0  MCV 88.0  PLT 350   Basic Metabolic Panel: Recent Labs  Lab 12/09/18 1440 12/09/18 1453  NA 136  --   K 3.7  --   CL 104  --   CO2  24  --   GLUCOSE 112*  --   BUN 10  --   CREATININE 1.01 1.10  CALCIUM 9.1  --    Liver Function Tests: Recent Labs  Lab 12/09/18 1440  AST 26  ALT 34  ALKPHOS 83  BILITOT 0.6  PROT 7.2  ALBUMIN 4.0   Coagulation Profile: Recent Labs  Lab 12/09/18 1440  INR 1.0   CBG: Recent Labs  Lab 12/09/18 1439 12/09/18 1446  GLUCAP 104* 118*   Urine analysis:    Component Value Date/Time   COLORURINE STRAW (A) 12/09/2018 1520   APPEARANCEUR CLEAR 12/09/2018 1520   LABSPEC 1.018 12/09/2018 1520   PHURINE 7.0 12/09/2018 1520   GLUCOSEU NEGATIVE 12/09/2018 1520   HGBUR NEGATIVE 12/09/2018 1520   BILIRUBINUR NEGATIVE 12/09/2018 1520   KETONESUR NEGATIVE 12/09/2018 1520   PROTEINUR NEGATIVE 12/09/2018 1520   UROBILINOGEN 0.2 01/26/2015 1200   NITRITE NEGATIVE 12/09/2018 1520   LEUKOCYTESUR NEGATIVE 12/09/2018 1520    Radiological Exams on Admission: Ct Head Code Stroke Wo Contrast  Result Date: 12/09/2018 CLINICAL DATA:  Code stroke.  Ataxia.  Rule out stroke. EXAM: CT HEAD WITHOUT CONTRAST TECHNIQUE: Contiguous axial images were obtained from the base of the skull through the vertex without intravenous contrast. COMPARISON:  MRI head 12/20/2009 FINDINGS: Brain: Mild atrophy. Negative for hydrocephalus. Negative for  acute infarct. Negative for acute hemorrhage or mass. Vascular: Negative for hyperdense vessel Skull: Negative Sinuses/Orbits: Negative Other: None ASPECTS ([place] Stroke Program Early CT Score) - Ganglionic level infarction (caudate, lentiform nuclei, internal capsule, insula, M1-M3 cortex): 7 - Supraganglionic infarction (M4-M6 cortex): 3 Total score (0-10 with 10 being normal): 10 IMPRESSION: 1. No acute abnormality 2. ASPECTS is 10 3. These results were called by telephone at the time of interpretation on 12/09/2018 at 2:57 pm to Dr. Aletta Edouard , who verbally acknowledged these results. Electronically Signed   By: Franchot Gallo M.D.   On: 12/09/2018 14:57    EKG: Independently reviewed.  Sinus rhythm, QTC 440, rate 65, EKG unchanged from prior.  Assessment/Plan Principal Problem:   Weakness of left lower extremity  Left lower extremity weakness- on exam 3-4/5 weakness, cannot sustain movement against gravity but can lift against gravity, No other focal deficits. Reported right facial droop resolved.  Chronic neck and back problems.  Initial concern for CVA with persistence of symptoms.  Head CT negative for acute abnormality.  - Brain MRI done in ED-negative for intracranial infarct or abnormality. -CTA head and neck-no hemodynamically significant or high-grade correctable stenosis. Atheromatous stenosis ~40% at the carotid bifurcations bilaterally, 50% stenosis at the origin of the left vertebral artery. -Lipid panel, hemoglobin A1c a.m -ECHO -Deferred carotid Dopplers and CTA neck done. - PT eval -Aspirin 325 given, continue 81 mg daily -Hold off on statins, poor intolerance in the past with myalgias with simvastatin -With negative brain MRI and history of chronic neck and back pain requiring surgeries, will obtain MRI lumbar spine  Dizziness-in the setting of acute left lower extremity weakness. Now Improved.  Likely peripheral etiology, middle/inner ear pathology, with fullness and  ringing.  Completed course of Augmentin for ear infection.  No history to suggest volume loss. -Orthostatic vitals -Echocardiogram -Follow-up with ENT outpatient - PT evaluation  Depression, bipolar disorder-stable. -Continue home lamotrigine, fluoxetine, home Xanax  Dyslipidemia- has not taking ezetimbe in ~1 year.  Intolerant to statins. - Lipid panel in a.m  Chronic back and neck pain- unchanged but with new left lower extremity weakness -Continue  home fentanyl patch -Lumbar MRI  HTN-stable -Continue home Norvasc 5 mg daily  DVT prophylaxis:  Lovenox Code Status: Full Family Communication:  Spouse and daughter at bedside. Daughter in an ER nurse here at [place].  Disposition Plan: 1-2 days Consults called: Neurology Admission status:    Bethena Roys MD [facility]  12/09/2018, 6:44 PM

## 2018-12-10 ENCOUNTER — Observation Stay (HOSPITAL_BASED_OUTPATIENT_CLINIC_OR_DEPARTMENT_OTHER): Payer: PPO

## 2018-12-10 DIAGNOSIS — I639 Cerebral infarction, unspecified: Secondary | ICD-10-CM

## 2018-12-10 DIAGNOSIS — E785 Hyperlipidemia, unspecified: Secondary | ICD-10-CM | POA: Diagnosis not present

## 2018-12-10 DIAGNOSIS — G894 Chronic pain syndrome: Secondary | ICD-10-CM

## 2018-12-10 DIAGNOSIS — G459 Transient cerebral ischemic attack, unspecified: Principal | ICD-10-CM

## 2018-12-10 DIAGNOSIS — Z720 Tobacco use: Secondary | ICD-10-CM | POA: Diagnosis not present

## 2018-12-10 DIAGNOSIS — R29898 Other symptoms and signs involving the musculoskeletal system: Secondary | ICD-10-CM | POA: Diagnosis not present

## 2018-12-10 LAB — LIPID PANEL
Cholesterol: 212 mg/dL — ABNORMAL HIGH (ref 0–200)
HDL: 34 mg/dL — AB (ref >40)
LDL Cholesterol: 109 mg/dL — ABNORMAL HIGH (ref 0–99)
Total CHOL/HDL Ratio: 6.2 RATIO
Triglycerides: 347 mg/dL — ABNORMAL HIGH (ref <150)
VLDL: 69 mg/dL — ABNORMAL HIGH (ref 0–40)

## 2018-12-10 LAB — HEMOGLOBIN A1C
Hgb A1c MFr Bld: 5.4 % (ref 4.8–5.6)
Mean Plasma Glucose: 108.28 mg/dL

## 2018-12-10 LAB — ECHOCARDIOGRAM COMPLETE
Height: 68 in
Weight: 3440 oz

## 2018-12-10 LAB — HIV ANTIBODY (ROUTINE TESTING W REFLEX): HIV Screen 4th Generation wRfx: NONREACTIVE

## 2018-12-10 MED ORDER — NICOTINE 14 MG/24HR TD PT24: 14.0000 mg | MEDICATED_PATCH | Freq: Every day | TRANSDERMAL | 0 refills | Status: DC

## 2018-12-10 MED ORDER — EZETIMIBE 10 MG PO TABS: 10.0000 mg | ORAL_TABLET | Freq: Every day | ORAL | 3 refills | Status: DC

## 2018-12-10 MED ORDER — OMEGA-3-ACID ETHYL ESTERS 1 G PO CAPS: 1.0000 g | ORAL_CAPSULE | Freq: Two times a day (BID) | ORAL | 1 refills | Status: DC

## 2018-12-10 MED ORDER — OMEGA-3-ACID ETHYL ESTERS 1 G PO CAPS
1.0000 g | ORAL_CAPSULE | Freq: Two times a day (BID) | ORAL | Status: DC
  Administered 2018-12-10: 1 g via ORAL
  Filled 2018-12-10: qty 1

## 2018-12-10 MED ORDER — ASPIRIN EC 325 MG PO TBEC: 325.0000 mg | DELAYED_RELEASE_TABLET | Freq: Every day | ORAL | 3 refills | Status: DC

## 2018-12-10 NOTE — Progress Notes (Signed)
*  PRELIMINARY RESULTS* Echocardiogram 2D Echocardiogram has been performed.  Stephen Porter 12/10/2018, 10:16 AM

## 2018-12-10 NOTE — Discharge Instructions (Signed)
Stroke Prevention °Some medical conditions and behaviors are associated with a higher chance of having a stroke. You can help prevent a stroke by making nutrition, lifestyle, and other changes, including managing any medical conditions you may have. °What nutrition changes can be made? ° °· Eat healthy foods. You can do this by: °? Choosing foods high in fiber, such as fresh fruits and vegetables and whole grains. °? Eating at least 5 or more servings of fruits and vegetables a day. Try to fill half of your plate at each meal with fruits and vegetables. °? Choosing lean protein foods, such as lean cuts of meat, poultry without skin, fish, tofu, beans, and nuts. °? Eating low-fat dairy products. °? Avoiding foods that are high in salt (sodium). This can help lower blood pressure. °? Avoiding foods that have saturated fat, trans fat, and cholesterol. This can help prevent high cholesterol. °? Avoiding processed and premade foods. °· Follow your health care provider's specific guidelines for losing weight, controlling high blood pressure (hypertension), lowering high cholesterol, and managing diabetes. These may include: °? Reducing your daily calorie intake. °? Limiting your daily sodium intake to 1,500 milligrams (mg). °? Using only healthy fats for cooking, such as olive oil, canola oil, or sunflower oil. °? Counting your daily carbohydrate intake. °What lifestyle changes can be made? °· Maintain a healthy weight. Talk to your health care provider about your ideal weight. °· Get at least 30 minutes of moderate physical activity at least 5 days a week. Moderate activity includes brisk walking, biking, and swimming. °· Do not use any products that contain nicotine or tobacco, such as cigarettes and e-cigarettes. If you need help quitting, ask your health care provider. It may also be helpful to avoid exposure to secondhand smoke. °· Limit alcohol intake to no more than 1 drink a day for nonpregnant women and 2 drinks  a day for men. One drink equals 12 oz of beer, 5 oz of wine, or 1½ oz of hard liquor. °· Stop any illegal drug use. °· Avoid taking birth control pills. Talk to your health care provider about the risks of taking birth control pills if: °? You are over 35 years old. °? You smoke. °? You get migraines. °? You have ever had a blood clot. °What other changes can be made? °· Manage your cholesterol levels. °? Eating a healthy diet is important for preventing high cholesterol. If cholesterol cannot be managed through diet alone, you may also need to take medicines. °? Take any prescribed medicines to control your cholesterol as told by your health care provider. °· Manage your diabetes. °? Eating a healthy diet and exercising regularly are important parts of managing your blood sugar. If your blood sugar cannot be managed through diet and exercise, you may need to take medicines. °? Take any prescribed medicines to control your diabetes as told by your health care provider. °· Control your hypertension. °? To reduce your risk of stroke, try to keep your blood pressure below 130/80. °? Eating a healthy diet and exercising regularly are an important part of controlling your blood pressure. If your blood pressure cannot be managed through diet and exercise, you may need to take medicines. °? Take any prescribed medicines to control hypertension as told by your health care provider. °? Ask your health care provider if you should monitor your blood pressure at home. °? Have your blood pressure checked every year, even if your blood pressure is normal. Blood pressure increases   with age and some medical conditions.  Get evaluated for sleep disorders (sleep apnea). Talk to your health care provider about getting a sleep evaluation if you snore a lot or have excessive sleepiness.  Take over-the-counter and prescription medicines only as told by your health care provider. Aspirin or blood thinners (antiplatelets or  anticoagulants) may be recommended to reduce your risk of forming blood clots that can lead to stroke.  Make sure that any other medical conditions you have, such as atrial fibrillation or atherosclerosis, are managed. What are the warning signs of a stroke? The warning signs of a stroke can be easily remembered as BEFAST.  B is for balance. Signs include: ? Dizziness. ? Loss of balance or coordination. ? Sudden trouble walking.  E is for eyes. Signs include: ? A sudden change in vision. ? Trouble seeing.  F is for face. Signs include: ? Sudden weakness or numbness of the face. ? The face or eyelid drooping to one side.  A is for arms. Signs include: ? Sudden weakness or numbness of the arm, usually on one side of the body.  S is for speech. Signs include: ? Trouble speaking (aphasia). ? Trouble understanding.  T is for time. ? These symptoms may represent a serious problem that is an emergency. Do not wait to see if the symptoms will go away. Get medical help right away. Call your local emergency services (911 in the [place] Do not drive yourself to the hospital.  Other signs of stroke may include: ? A sudden, severe headache with no known cause. ? Nausea or vomiting. ? Seizure. Where to find more information For more information, visit:  American Stroke Association: www.strokeassociation.org  National Stroke Association: www.stroke.org Summary  You can prevent a stroke by eating healthy, exercising, not smoking, limiting alcohol intake, and managing any medical conditions you may have.  Do not use any products that contain nicotine or tobacco, such as cigarettes and e-cigarettes. If you need help quitting, ask your health care provider. It may also be helpful to avoid exposure to secondhand smoke.  Remember BEFAST for warning signs of stroke. Get help right away if you or a loved one has any of these signs. This information is not intended to replace advice given to you  by your health care provider. Make sure you discuss any questions you have with your health care provider. Document Released: 10/18/2004 Document Revised: 10/16/2016 Document Reviewed: 10/16/2016 Elsevier Interactive Patient Education  2019 [employer].   Ischemic Stroke  An ischemic stroke is the sudden death of brain tissue. Blood carries oxygen to all areas of the body. This type of stroke happens when your blood does not flow to your brain like normal. Your brain cannot get the oxygen it needs. This is an emergency. It must be treated right away. Symptoms of a stroke usually happen all of a sudden. You may notice them when you wake up. They can include:  Weakness or loss of feeling in your face, arm, or leg. This often happens on one side of the body.  Trouble walking.  Trouble moving your arms or legs.  Loss of balance or coordination.  Feeling confused.  Trouble talking or understanding what people are saying.  Slurred speech.  Trouble seeing.  Seeing two of one object (double vision).  Feeling dizzy.  Feeling sick to your stomach (nauseous) and throwing up (vomiting).  A very bad headache for no reason. Get help as soon as any of these problems start.  This is important. Some treatments work better if they are given right away. These include:  Aspirin.  Medicines to control blood pressure.  A shot (injection) of medicine to break up the blood clot.  Treatments given in the blood vessel (artery) to take out the clot or break it up. Other treatments may include:  Oxygen.  Fluids given through an IV tube.  Medicines to thin out your blood.  Procedures to help your blood flow better. What increases the risk? Certain things may make you more likely to have a stroke. Some of these are things that you can change, such as:  Being very overweight (obesity).  Smoking.  Taking birth control pills.  Not being active.  Drinking too much alcohol.  Using  drugs. Other risk factors include:  High blood pressure.  High cholesterol.  Diabetes.  Heart disease.  Being [place] American, Native American, Hispanic, or [place] Native.  Being over age 83.  Family history of stroke.  Having had blood clots, stroke, or warning stroke (transient ischemic attack, TIA) in the past.  Sickle cell disease.  Being a woman with a history of high blood pressure in pregnancy (preeclampsia).  Migraine headache.  Sleep apnea.  Having an irregular heartbeat (atrial fibrillation).  Long-term (chronic) diseases that cause soreness and swelling (inflammation).  Disorders that affect how your blood clots. Follow these instructions at home: Medicines  Take over-the-counter and prescription medicines only as told by your doctor.  If you were told to take aspirin or another medicine to thin your blood, take it exactly as told by your doctor. ? Taking too much of the medicine can cause bleeding. ? If you do not take enough, it may not work as well.  Know the side effects of your medicines. If you are taking a blood thinner, make sure you: ? Hold pressure over any cuts for longer than usual. ? Tell your dentist and other doctors that you take this medicine. ? Avoid activities that may cause damage or injury to your body. Eating and drinking  Follow instructions from your doctor about what you cannot eat or drink.  Eat healthy foods.  If you have trouble with swallowing, do these things to avoid choking: ? Take small bites when eating. ? Eat foods that are soft or pureed. Safety  Follow instructions from your health care team about physical activity.  Use a walker or cane as told by your doctor.  Keep your home safe so you do not fall. This may include: ? Having experts look at your home to make sure it is safe. ? Putting grab bars in the bedroom and bathroom. ? Using raised toilets. ? Putting a seat in the shower. General  instructions  Do not use any tobacco products. ? Examples of these are cigarettes, chewing tobacco, and e-cigarettes. ? If you need help quitting, ask your doctor.  Limit how much alcohol you drink. This means no more than 1 drink a day for nonpregnant women and 2 drinks a day for men. One drink equals 12 oz of beer, 5 oz of wine, or 1 oz of hard liquor.  If you need help to stop using drugs or alcohol, ask your doctor to refer you to a program or specialist.  Stay active. Exercise as told by your doctor.  Keep all follow-up visits as told by your doctor. This is important. Get help right away if:   You have any signs of a stroke. "BE FAST" is an easy way  to remember the main warning signs: ? B - Balance. Signs are dizziness, sudden trouble walking, or loss of balance. ? E - Eyes. Signs are trouble seeing or a change in how you see. ? F - Face. Signs are sudden weakness or loss of feeling of the face, or the face or eyelid drooping on one side. ? A - Arms. Signs are weakness or loss of feeling in an arm. This happens suddenly and usually on one side of the body. ? S - Speech. Signs are sudden trouble speaking, slurred speech, or trouble understanding what people say. ? T - Time. Time to call emergency services. Write down what time symptoms started.  You have other signs of a stroke, such as: ? A sudden, very bad headache with no known cause. ? Feeling sick to your stomach (nausea). ? Throwing up (vomiting). ? Jerky movements you cannot control (seizure). These symptoms may be an emergency. Do not wait to see if the symptoms will go away. Get medical help right away. Call your local emergency services (911 in the [place] Do not drive yourself to the hospital. Summary  An ischemic stroke is the sudden death of brain tissue.  Symptoms of a stroke usually happen all of a sudden. You may notice them when you wake up.  Get help if you have any warning signs of a stroke. This is  important. Some treatments work better if they are given right away. This information is not intended to replace advice given to you by your health care provider. Make sure you discuss any questions you have with your health care provider. Document Released: 08/30/2011 Document Revised: 02/19/2018 Document Reviewed: 12/07/2015 Elsevier Interactive Patient Education  2019 [place] for Ischemic Stroke  Alteplase is a medicine that can dissolve blood clots. An ischemic stroke is caused by clots that block blood flow to the brain. Alteplase can help treat a stroke if it is given very shortly after stroke symptoms begin. Before giving this medicine, your doctor will decide if the medicine may work for you based on:  Your age.  Your condition.  Other factors. Tell your doctor about:  Any allergies you have.  All medicines you are taking.  Any blood disorders you have.  Any medical conditions you have.  Any bleeding in the last month, including stomach or vaginal bleeding.  Any surgeries or procedures you have had.  Whether you are pregnant or may be pregnant.  When your symptoms started. What are the risks? Generally, this is a safe treatment. However, problems may happen, including:  Bleeding.  Allergic reactions to the medicine. What happens before the procedure?  Your doctor will do an exam.  Your doctor will check your: ? Blood pressure. ? Heart rate. ? Breathing.  Medicines may be given to adjust your blood pressure, if needed.  You may have tests, such as: ? Blood tests. ? A head scan (CT scan). What happens during the procedure?  An IV tube will be put into one of your veins.  Alteplase will be given to you through the IV tube.  In some cases, this medicine may be given directly to the affected area through a thin tube (catheter). This is usually put in at the top of your leg.  Your health care team will closely watch  your: ? Blood pressure. ? Heart rate. ? Breathing.  Your doctor will check often to see how well the medicine is working.  If the medicine causes bleeding,  it will be stopped. Another treatment will be started. What happens after the procedure?  You will be watched closely in the ICU or the stroke unit.  You will be assessed by several specialists.  If you had a catheter, you may have: ? Bruising. ? Soreness. ? Swelling.  It may take days, weeks, or months to fully see how well your body responded to the treatment. Follow these instructions in the hospital: Medicines  Take over-the-counter and prescription medicines only as told by your doctor. Activity  Do not get out of bed without help. This is for your safety.  Limit activity after your treatment.  Do stroke rehab programs as told by your doctor. General instructions  Tell a nurse or doctor right away if you have any bleeding, bruising or injuries.  Use a soft-bristled toothbrush. Brush your teeth gently. Get help right away if you have:  Blood in your vomit, stool, or urine.  A serious fall or accident, or you hit your head.  Symptoms of an allergic reaction such as rash or difficulty breathing.  Any signs of a stroke. "BE FAST" is an easy way to remember the main warning signs. ? B - Balance. Signs are dizziness, sudden trouble walking, or loss of balance. ? E - Eyes. Signs are trouble seeing or a sudden change in how you see. ? F - Face. Signs are sudden weakness or loss of feeling in the face, or the face or eyelid drooping on one side. ? A - Arms. Signs are weakness or loss of feeling in an arm. This happens suddenly and usually on one side of the body. ? S - Speech. Signs are sudden trouble speaking, slurred speech, or trouble understanding what people say. ? T - Time. Time to call emergency services. Write down what time symptoms started.  Other signs of a stroke. This may include: ? A sudden, very bad  headache with no known cause. ? Feeling sick to your stomach (nausea). ? Throwing up. ? Uncontrolled, jerky movements (seizure). These symptoms may represent a serious problem that is an emergency. Do not wait to see if the symptoms will go away. Get medical help right away.  Summary  Alteplase is a medicine that can break up blood clots. Blood clots can cause a stroke.  This medicine may help if you get it as soon as possible after your stroke symptoms start.  Get help right away if you are showing signs of increased bleeding or are having symptoms of stroke. This information is not intended to replace advice given to you by your health care provider. Make sure you discuss any questions you have with your health care provider. Document Released: 12/26/2010 Document Revised: 10/09/2017 Document Reviewed: 10/09/2017 Elsevier Interactive Patient Education  2019 [employer].   Ischemic Stroke  An ischemic stroke is the sudden death of brain tissue. Blood carries oxygen to all areas of the body. This type of stroke happens when your blood does not flow to your brain like normal. Your brain cannot get the oxygen it needs. This is an emergency. It must be treated right away. Symptoms of a stroke usually happen all of a sudden. You may notice them when you wake up. They can include:  Weakness or loss of feeling in your face, arm, or leg. This often happens on one side of the body.  Trouble walking.  Trouble moving your arms or legs.  Loss of balance or coordination.  Feeling confused.  Trouble talking or  understanding what people are saying.  Slurred speech.  Trouble seeing.  Seeing two of one object (double vision).  Feeling dizzy.  Feeling sick to your stomach (nauseous) and throwing up (vomiting).  A very bad headache for no reason. Get help as soon as any of these problems start. This is important. Some treatments work better if they are given right away. These  include:  Aspirin.  Medicines to control blood pressure.  A shot (injection) of medicine to break up the blood clot.  Treatments given in the blood vessel (artery) to take out the clot or break it up. Other treatments may include:  Oxygen.  Fluids given through an IV tube.  Medicines to thin out your blood.  Procedures to help your blood flow better. What increases the risk? Certain things may make you more likely to have a stroke. Some of these are things that you can change, such as:  Being very overweight (obesity).  Smoking.  Taking birth control pills.  Not being active.  Drinking too much alcohol.  Using drugs. Other risk factors include:  High blood pressure.  High cholesterol.  Diabetes.  Heart disease.  Being [place] American, Native American, Hispanic, or [place] Native.  Being over age 24.  Family history of stroke.  Having had blood clots, stroke, or warning stroke (transient ischemic attack, TIA) in the past.  Sickle cell disease.  Being a woman with a history of high blood pressure in pregnancy (preeclampsia).  Migraine headache.  Sleep apnea.  Having an irregular heartbeat (atrial fibrillation).  Long-term (chronic) diseases that cause soreness and swelling (inflammation).  Disorders that affect how your blood clots. Follow these instructions at home: Medicines  Take over-the-counter and prescription medicines only as told by your doctor.  If you were told to take aspirin or another medicine to thin your blood, take it exactly as told by your doctor. ? Taking too much of the medicine can cause bleeding. ? If you do not take enough, it may not work as well.  Know the side effects of your medicines. If you are taking a blood thinner, make sure you: ? Hold pressure over any cuts for longer than usual. ? Tell your dentist and other doctors that you take this medicine. ? Avoid activities that may cause damage or injury to your  body. Eating and drinking  Follow instructions from your doctor about what you cannot eat or drink.  Eat healthy foods.  If you have trouble with swallowing, do these things to avoid choking: ? Take small bites when eating. ? Eat foods that are soft or pureed. Safety  Follow instructions from your health care team about physical activity.  Use a walker or cane as told by your doctor.  Keep your home safe so you do not fall. This may include: ? Having experts look at your home to make sure it is safe. ? Putting grab bars in the bedroom and bathroom. ? Using raised toilets. ? Putting a seat in the shower. General instructions  Do not use any tobacco products. ? Examples of these are cigarettes, chewing tobacco, and e-cigarettes. ? If you need help quitting, ask your doctor.  Limit how much alcohol you drink. This means no more than 1 drink a day for nonpregnant women and 2 drinks a day for men. One drink equals 12 oz of beer, 5 oz of wine, or 1 oz of hard liquor.  If you need help to stop using drugs or alcohol, ask your  doctor to refer you to a program or specialist.  Stay active. Exercise as told by your doctor.  Keep all follow-up visits as told by your doctor. This is important. Get help right away if:   You have any signs of a stroke. "BE FAST" is an easy way to remember the main warning signs: ? B - Balance. Signs are dizziness, sudden trouble walking, or loss of balance. ? E - Eyes. Signs are trouble seeing or a change in how you see. ? F - Face. Signs are sudden weakness or loss of feeling of the face, or the face or eyelid drooping on one side. ? A - Arms. Signs are weakness or loss of feeling in an arm. This happens suddenly and usually on one side of the body. ? S - Speech. Signs are sudden trouble speaking, slurred speech, or trouble understanding what people say. ? T - Time. Time to call emergency services. Write down what time symptoms started.  You have  other signs of a stroke, such as: ? A sudden, very bad headache with no known cause. ? Feeling sick to your stomach (nausea). ? Throwing up (vomiting). ? Jerky movements you cannot control (seizure). These symptoms may be an emergency. Do not wait to see if the symptoms will go away. Get medical help right away. Call your local emergency services (911 in the [place] Do not drive yourself to the hospital. Summary  An ischemic stroke is the sudden death of brain tissue.  Symptoms of a stroke usually happen all of a sudden. You may notice them when you wake up.  Get help if you have any warning signs of a stroke. This is important. Some treatments work better if they are given right away. This information is not intended to replace advice given to you by your health care provider. Make sure you discuss any questions you have with your health care provider. Document Released: 08/30/2011 Document Revised: 02/19/2018 Document Reviewed: 12/07/2015 Elsevier Interactive Patient Education  2019 [employer].

## 2018-12-10 NOTE — Discharge Summary (Signed)
Physician Discharge Summary  Stephen Porter ZOX:[id number] DOB: Mar 04, 1956 DOA: 12/09/2018  PCP: Monico Blitz, MD  Admit date: 12/09/2018 Discharge date: 12/10/2018  Time spent: 35 minutes  Recommendations for Outpatient Follow-up:  1. Repeat lipid panel in 6 weeks 2. Repeat basic metabolic panel to follow electrolytes and renal function 3. Make sure patient has follow-up with ENT to further address left ear abnormal sensations. 4. Continue assisting patient in his smoking cessation journey.   Discharge Diagnoses:  Principal Problem:   Weakness of left lower extremity Active Problems:   Tobacco abuse   TIA (transient ischemic attack)   Chronic pain syndrome   Dyslipidemia   Discharge Condition: Stable and improved.  Patient discharged home with instruction to follow-up with PCP in 2 weeks and with neurology (Dr. Merlene Laughter) in 6 weeks.  Diet recommendation: Heart healthy diet.  Filed Weights   12/09/18 1437  Weight: 97.5 kg    History of present illness:  As per H&P written by Dr. Denton Brick on 12/09/2018 63 y.o. male with medical history significant for depression, anxiety, bipolar disorder, who presented to the ED with reports of weakness of his left leg.  Patient was doing well on 11/05/1928 today when he was outside suddenly he became lightheaded, like he would pass out, he noted some heaviness/weakness in his left lower extremity.  Family noted he was leaning to the left with difficulty walking.  Also noted was some slight right facial droop.  No vision change, no voice change.  No slurring of speech, but daughter reports patient was having some mild delay in responding to her questions. At the time of my evaluation dizziness/lightheadedness has improved but not completely resolved.  He has persistent weakness of his left lower extremity that has slightly improved. He has chronic neck and back pain,  reqiuring surgeries both of which are unchanged.  He does not take daily  aspirin.  He has had bad reaction with myalgias to statins-Simvastatin in the past. Daughter is an ER nurse here at [facility], she reports patient has abnormal cholesterol, with especially high triglycerides, supposed to be on Zetia but he has not been taking this medication.  Patient about 3 weeks ago was treated for left ear infection with Augmentin, ports ongoing ringing and fullness in left ear.  He is supposed to follow-up with ENT in a few weeks.  ED Course: Systolic blood pressure 811B to 150s.  Remarkable CMP, mild leukocytosis 11.3, unremarkable blood alcohol level.  Positive for benzos and cannabinoids.  CT negative for acute abnormality.  Telemetry neuro consulted-TPA not recommended as symptoms are mild, risk outweighs benefit, CTA head and neck brain MRI and full stroke work-up with neurology consult inpatient recommended.  Aspirin 325 given in the ED. Hospitalist to admit for stroke work-up.  Hospital Course:  1-TIA -Work-up demonstrated no acute stroke -Symptoms resolved at time of discharge -Lipid profile demonstrated uncontrolled dyslipidemia (see below for treatment and recommendations). -A1c 5.4 -Normal 2D echo and no significant abnormalities appreciated on CT angio of his neck. -Patient has been discharged on full dose aspirin for secondary prevention in the setting of TIA -Continue risk factors modification -Advised to quit smoking. -Outpatient follow-up with neurology in 6 weeks.  2-essential hypertension -Is stable and well-controlled -Continue Norvasc.  3-tobacco abuse -Cessation counseling provided -Discharge with prescription for nicotine patch.  4-dyslipidemia -Elevated triglycerides and low HDL appreciated on lipid panel. -Patient discharged on Zetia and Lovaza -Heart healthy diet has been recommended. -Repeat fasting lipid profile in 6  weeks.  5-chronic pain -Continue home analgesic regimen and outpatient follow-up.  6-depression/anxiety -Stable  mood -No suicidal ideation or hallucination -Continue current antidepressant/anxiolytic regimen.  7-left ear ringing and full Sensation -Has been recently treated for otitis -We will recommend outpatient follow-up with ENT.  Procedures: See below for x-ray reports -CT Angio of the neck: Demonstrating 50% stenosis, no signs of hemodynamically unstability in his vessels. -2D echo: Preserved ejection fraction, no wall motion and normalities, no significant valvular disease.  No source of emboli appreciated.  Consultations:  Neurology (Dr. Merlene Laughter).  Discharge Exam: Vitals:   12/10/18 1245 12/10/18 1645  BP: 129/71 109/71  Pulse: 61 (!) 55  Resp: 18 20  Temp: 98.3 F (36.8 C) 97.6 F (36.4 C)  SpO2:      General: Afebrile, no chest pain, no shortness of breath, no nausea, no vomiting.  Patient reports complete resolution of left-sided weakness/numbness and did not represent any neurologic residual deficit. Cardiovascular: S1 and S2, no rubs, no gallops, no murmurs.  No JVD on exam. Respiratory: Clear to auscultation bilaterally Abdomen: Soft, nontender, nondistended, positive bowel sounds Extremities: No cyanosis, no clubbing; no pronator drift.  Discharge Instructions   Discharge Instructions    Diet - low sodium heart healthy   Complete by:  As directed    Discharge instructions   Complete by:  As directed    Take medications as prescribed Follow heart healthy diet Arrange follow-up with PCP in 2 weeks Follow-up with neurology (Dr. Merlene Laughter) in 6 weeks. Stop the use of NSAIDs (especially high dose of ibuprofen). Keep yourself well-hydrated.     Allergies as of 12/10/2018      Reactions   Elavil [amitriptyline] Other (See Comments)   Shaky      Medication List    STOP taking these medications   ibuprofen 200 MG tablet Commonly known as:  ADVIL,MOTRIN     TAKE these medications   ALPRAZolam 0.5 MG tablet Commonly known as:  XANAX Take 1 tablet (0.5 mg  total) by mouth 3 (three) times daily.   amLODipine 5 MG tablet Commonly known as:  NORVASC Take 5 mg by mouth at bedtime.   aspirin EC 325 MG tablet Take 1 tablet (325 mg total) by mouth daily. Start taking on:  December 11, 2018   baclofen 10 MG tablet Commonly known as:  LIORESAL Take 10 mg by mouth daily.   ezetimibe 10 MG tablet Commonly known as:  ZETIA Take 1 tablet (10 mg total) by mouth daily.   fentaNYL 50 MCG/HR Commonly known as:  DURAGESIC Apply 50 mcg/hr topically every 3 (three) days.   FLUoxetine 20 MG capsule Commonly known as:  PROZAC Take 1 capsule (20 mg total) by mouth every morning.   lamoTRIgine 100 MG tablet Commonly known as:  LAMICTAL Taking 0.5 tablets in AM 1 tablet at Night What changed:    how much to take  how to take this  when to take this  additional instructions   nicotine 14 mg/24hr patch Commonly known as:  NICODERM CQ - dosed in mg/24 hours Place 1 patch (14 mg total) onto the skin daily. Start taking on:  December 11, 2018   omega-3 acid ethyl esters 1 g capsule Commonly known as:  LOVAZA Take 1 capsule (1 g total) by mouth 2 (two) times daily.      Allergies  Allergen Reactions  . Elavil [Amitriptyline] Other (See Comments)    Shaky   Follow-up Information    Monico Blitz,  MD. Schedule an appointment as soon as possible for a visit in 2 week(s).   Specialty:  Internal Medicine Contact information: [address]  [place] [address] [phone]        Phillips Odor, MD. Schedule an appointment as soon as possible for a visit in 6 week(s).   Specialty:  Neurology Contact information: [address] [place] [address] [phone]            The results of significant diagnostics from this hospitalization (including imaging, microbiology, ancillary and laboratory) are listed below for reference.    Significant Diagnostic Studies: Ct Angio Head W Or Wo Contrast  Result Date: 12/09/2018 CLINICAL  DATA:  Initial evaluation EXAM: CT ANGIOGRAPHY HEAD AND NECK TECHNIQUE: Multidetector CT imaging of the head and neck was performed using the standard protocol during bolus administration of intravenous contrast. Multiplanar CT image reconstructions and MIPs were obtained to evaluate the vascular anatomy. Carotid stenosis measurements (when applicable) are obtained utilizing NASCET criteria, using the distal internal carotid diameter as the denominator. CONTRAST:  136mL ISOVUE-370 IOPAMIDOL (ISOVUE-370) INJECTION 76% COMPARISON:  Prior CT from earlier the same day. FINDINGS: CTA NECK FINDINGS Aortic arch: Visualized aortic arch of normal caliber with normal 3 vessel morphology. Mild atheromatous plaque within the aortic arch. No flow-limiting stenosis about the origin of the great vessels. Visualized subclavian arteries widely patent. Right carotid system: Right common carotid artery widely patent from its origin to the bifurcation without stenosis. Mixed plaque about the right bifurcation/proximal right ICA widely patent distally without stenosis, dissection, or occlusion. Left carotid system: Left common carotid artery patent from its origin to the bifurcation without stenosis. Multifocal plaque about the origin of the left ICA with stenoses of up to 40% by NASCET criteria. Left ICA otherwise widely patent without stenosis, dissection, or occlusion. Vertebral arteries: Both of the vertebral arteries arise from the subclavian arteries. Atheromatous narrowing of up to approximately 50% at the origin of the left vertebral artery. Vertebral arteries otherwise widely patent within the neck without stenosis, dissection, or occlusion. Skeleton: No acute osseous finding. No discrete lytic or blastic osseous lesions. Prior cervical fusion at C5-C7, with anterior plate screw fixation in place at C5-6. Adjacent segment disease at C4-5 noted. Other neck: No other acute soft tissue abnormality within the neck. 14 mm  heterogeneous left thyroid nodule noted, of doubtful significance. Upper chest: Visualized upper chest demonstrates no acute finding. Moderate to advanced emphysematous changes noted within the visualized lungs. Review of the MIP images confirms the above findings CTA HEAD FINDINGS Anterior circulation: Petrous segments patent bilaterally. Scattered atheromatous plaque within the cavernous and supraclinoid ICAs with mild to moderate multifocal narrowing (no more than 30-50%). ICA termini widely patent. A1 segments well perfused. Normal anterior communicating artery. Anterior cerebral arteries widely patent to their distal aspects. M1 segments widely patent bilaterally. Normal MCA bifurcations. Distal MCA branches well perfused and symmetric. Posterior circulation: Vertebral arteries widely patent to the vertebrobasilar junction. Left vertebral artery slightly dominant. Posterior inferior cerebral arteries patent bilaterally. Basilar widely patent to its distal aspect. Superior cerebral arteries patent bilaterally. Both of the posterior cerebral arteries primarily supplied via the basilar and are widely patent to their distal aspects. Venous sinuses: Patent. Anatomic variants: None significant. Delayed phase: No abnormal enhancement. Review of the MIP images confirms the above findings IMPRESSION: 1. Negative CTA for EVLO. 2. No hemodynamically significant or high-grade/correctable stenosis. 3. Atheromatous stenoses of approximately 40% about the carotid bifurcations bilaterally. 4. 50% stenosis at  the origin of the left vertebral artery. Otherwise widely patent vertebrobasilar system. 5. Emphysema. Critical Value/emergent results were called by telephone at the time of interpretation on 12/09/2018 at 3:50 pm to Dr. Aletta Edouard , who verbally acknowledged these results. Electronically Signed   By: Jeannine Boga M.D.   On: 12/09/2018 15:53   Ct Angio Neck W Or Wo Contrast  Result Date: 12/09/2018 CLINICAL  DATA:  Initial evaluation EXAM: CT ANGIOGRAPHY HEAD AND NECK TECHNIQUE: Multidetector CT imaging of the head and neck was performed using the standard protocol during bolus administration of intravenous contrast. Multiplanar CT image reconstructions and MIPs were obtained to evaluate the vascular anatomy. Carotid stenosis measurements (when applicable) are obtained utilizing NASCET criteria, using the distal internal carotid diameter as the denominator. CONTRAST:  165mL ISOVUE-370 IOPAMIDOL (ISOVUE-370) INJECTION 76% COMPARISON:  Prior CT from earlier the same day. FINDINGS: CTA NECK FINDINGS Aortic arch: Visualized aortic arch of normal caliber with normal 3 vessel morphology. Mild atheromatous plaque within the aortic arch. No flow-limiting stenosis about the origin of the great vessels. Visualized subclavian arteries widely patent. Right carotid system: Right common carotid artery widely patent from its origin to the bifurcation without stenosis. Mixed plaque about the right bifurcation/proximal right ICA widely patent distally without stenosis, dissection, or occlusion. Left carotid system: Left common carotid artery patent from its origin to the bifurcation without stenosis. Multifocal plaque about the origin of the left ICA with stenoses of up to 40% by NASCET criteria. Left ICA otherwise widely patent without stenosis, dissection, or occlusion. Vertebral arteries: Both of the vertebral arteries arise from the subclavian arteries. Atheromatous narrowing of up to approximately 50% at the origin of the left vertebral artery. Vertebral arteries otherwise widely patent within the neck without stenosis, dissection, or occlusion. Skeleton: No acute osseous finding. No discrete lytic or blastic osseous lesions. Prior cervical fusion at C5-C7, with anterior plate screw fixation in place at C5-6. Adjacent segment disease at C4-5 noted. Other neck: No other acute soft tissue abnormality within the neck. 14 mm  heterogeneous left thyroid nodule noted, of doubtful significance. Upper chest: Visualized upper chest demonstrates no acute finding. Moderate to advanced emphysematous changes noted within the visualized lungs. Review of the MIP images confirms the above findings CTA HEAD FINDINGS Anterior circulation: Petrous segments patent bilaterally. Scattered atheromatous plaque within the cavernous and supraclinoid ICAs with mild to moderate multifocal narrowing (no more than 30-50%). ICA termini widely patent. A1 segments well perfused. Normal anterior communicating artery. Anterior cerebral arteries widely patent to their distal aspects. M1 segments widely patent bilaterally. Normal MCA bifurcations. Distal MCA branches well perfused and symmetric. Posterior circulation: Vertebral arteries widely patent to the vertebrobasilar junction. Left vertebral artery slightly dominant. Posterior inferior cerebral arteries patent bilaterally. Basilar widely patent to its distal aspect. Superior cerebral arteries patent bilaterally. Both of the posterior cerebral arteries primarily supplied via the basilar and are widely patent to their distal aspects. Venous sinuses: Patent. Anatomic variants: None significant. Delayed phase: No abnormal enhancement. Review of the MIP images confirms the above findings IMPRESSION: 1. Negative CTA for EVLO. 2. No hemodynamically significant or high-grade/correctable stenosis. 3. Atheromatous stenoses of approximately 40% about the carotid bifurcations bilaterally. 4. 50% stenosis at the origin of the left vertebral artery. Otherwise widely patent vertebrobasilar system. 5. Emphysema. Critical Value/emergent results were called by telephone at the time of interpretation on 12/09/2018 at 3:50 pm to Dr. Aletta Edouard , who verbally acknowledged these results. Electronically Signed   By: Marland Kitchen  Jeannine Boga M.D.   On: 12/09/2018 15:53   Mr Brain Wo Contrast  Result Date: 12/09/2018 CLINICAL DATA:   Initial evaluation for acute stroke. Right-sided weakness. EXAM: MRI HEAD WITHOUT CONTRAST TECHNIQUE: Multiplanar, multiecho pulse sequences of the brain and surrounding structures were obtained without intravenous contrast. COMPARISON:  Prior CT and CTA from earlier same day. FINDINGS: Brain: Generalized age-related cerebral atrophy. Minimal T2/FLAIR hyperintensity within the periventricular and subcortical white matter both cerebral hemispheres, nonspecific, but felt to be within normal limits for age. No abnormal foci of restricted diffusion to suggest acute or subacute ischemia. Gray-white matter differentiation maintained. No encephalomalacia to suggest chronic cortical infarction. No evidence for acute or chronic intracranial hemorrhage. No mass lesion, midline shift or mass effect. No hydrocephalus. No extra-axial fluid collection. Pituitary gland suprasellar region normal. Midline structures intact. Vascular: Major intracranial vascular flow voids are maintained. Skull and upper cervical spine: Craniocervical junction normal. Upper cervical spine within normal limits. Bone marrow signal intensity normal. No scalp soft tissue abnormality. Sinuses/Orbits: Globes normal soft tissues demonstrate no acute finding. Remote posttraumatic defect noted at the right lamina papyracea. Paranasal sinuses are clear. Left mastoid effusion with fluid density within the left middle ear cavity. Inner ear structures grossly normal. Other: None. IMPRESSION: 1. Negative brain MRI. No acute intracranial infarct or other abnormality identified. 2. Left mastoid and middle ear effusion, indeterminate. Clinical correlation for possible otomastoiditis recommended. Electronically Signed   By: Jeannine Boga M.D.   On: 12/09/2018 16:07   Ct Head Code Stroke Wo Contrast  Result Date: 12/09/2018 CLINICAL DATA:  Code stroke.  Ataxia.  Rule out stroke. EXAM: CT HEAD WITHOUT CONTRAST TECHNIQUE: Contiguous axial images were obtained  from the base of the skull through the vertex without intravenous contrast. COMPARISON:  MRI head 12/20/2009 FINDINGS: Brain: Mild atrophy. Negative for hydrocephalus. Negative for acute infarct. Negative for acute hemorrhage or mass. Vascular: Negative for hyperdense vessel Skull: Negative Sinuses/Orbits: Negative Other: None ASPECTS ([place] Stroke Program Early CT Score) - Ganglionic level infarction (caudate, lentiform nuclei, internal capsule, insula, M1-M3 cortex): 7 - Supraganglionic infarction (M4-M6 cortex): 3 Total score (0-10 with 10 being normal): 10 IMPRESSION: 1. No acute abnormality 2. ASPECTS is 10 3. These results were called by telephone at the time of interpretation on 12/09/2018 at 2:57 pm to Dr. Aletta Edouard , who verbally acknowledged these results. Electronically Signed   By: Franchot Gallo M.D.   On: 12/09/2018 14:57   Microbiology: No results found for this or any previous visit (from the past 240 hour(s)).   Labs: Basic Metabolic Panel: Recent Labs  Lab 12/09/18 1440 12/09/18 1453  NA 136  --   K 3.7  --   CL 104  --   CO2 24  --   GLUCOSE 112*  --   BUN 10  --   CREATININE 1.01 1.10  CALCIUM 9.1  --    Liver Function Tests: Recent Labs  Lab 12/09/18 1440  AST 26  ALT 34  ALKPHOS 83  BILITOT 0.6  PROT 7.2  ALBUMIN 4.0   CBC: Recent Labs  Lab 12/09/18 1440  WBC 11.3*  NEUTROABS 8.6*  HGB 15.5  HCT 47.0  MCV 88.0  PLT 236   CBG: Recent Labs  Lab 12/09/18 1439 12/09/18 1446  GLUCAP 104* 118*    Signed:  Barton Dubois MD.  [facility] 12/10/2018, 5:27 PM

## 2018-12-10 NOTE — Care Management Obs Status (Signed)
[place] NOTIFICATION   Patient Details  Name: Stephen Porter MRN: [id number] Date of Birth: [birth year]   Medicare Observation Status Notification Given:  Yes    [place], LCSW 12/10/2018, 4:35 PM

## 2018-12-10 NOTE — Evaluation (Signed)
Physical Therapy Evaluation Patient Details Name: Stephen Porter MRN: [id number] DOB: 01-01-1956 Today's Date: 12/10/2018   History of Present Illness  Stephen Porter is a 63 y.o. male with medical history significant for depression, anxiety, bipolar disorder, who presented to the ED with reports of weakness of his left leg.  Patient was doing well on 11/05/1928 today when he was outside suddenly he became lightheaded, like he would pass out, he noted some heaviness/weakness in his left lower extremity.  Family noted he was leaning to the left with difficulty walking.  Also noted was some slight right facial droop.  No vision change, no voice change.  No slurring of speech, but daughter reports patient was having some mild delay in responding to her questions.    Clinical Impression  Patient functioning near baseline for functional mobility and gait and c/o mostly of mild headache, no significant change in BP when taking orthostatics, able to ambulate in hallway without loss of balance with slightly labored slower than normal cadence which is mostly baseline per patient.  Patient encouraged to ambulate ad lib for length of stay in hospital.  Plan:  Patient discharged from physical therapy to care of nursing for ambulation daily as tolerated for length of stay.    Follow Up Recommendations No PT follow up    Equipment Recommendations  None recommended by PT    Recommendations for Other Services       Precautions / Restrictions Precautions Precautions: None Restrictions Weight Bearing Restrictions: No      Mobility  Bed Mobility Overal bed mobility: Modified Independent             General bed mobility comments: increased time  Transfers Overall transfer level: Modified independent               General transfer comment: increased time  Ambulation/Gait Ambulation/Gait assistance: Supervision Gait Distance (Feet): 150 Feet Assistive device: None Gait  Pattern/deviations: WFL(Within Functional Limits);Decreased step length - right;Decreased step length - left;Decreased stride length Gait velocity: decreased   General Gait Details: slightly labored cadence without loss of balance  Stairs            Wheelchair Mobility    Modified Rankin (Stroke Patients Only)       Balance Overall balance assessment: No apparent balance deficits (not formally assessed)                                           Pertinent Vitals/Pain Pain Assessment: 0-10 Pain Score: 4  Pain Location: headache Pain Descriptors / Indicators: Headache Pain Intervention(s): Limited activity within patient's tolerance;Monitored during session;Premedicated before session    Home Living Family/patient expects to be discharged to:: Private residence Living Arrangements: Spouse/significant other Available Help at Discharge: Family;Available PRN/intermittently Type of Home: House Home Access: Level entry     Home Layout: One level Home Equipment: Cane - single point;Bedside commode;Shower seat      Prior Function Level of Independence: Independent with assistive device(s)         Comments: household and short distanced community ambulator with SPC PRN, drives     Hand Dominance        Extremity/Trunk Assessment   Upper Extremity Assessment Upper Extremity Assessment: Overall WFL for tasks assessed    Lower Extremity Assessment Lower Extremity Assessment: Generalized weakness;RLE deficits/detail;LLE deficits/detail RLE Deficits / Details: grossly 5/5 LLE Deficits /  Details: grossly 4/5    Cervical / Trunk Assessment Cervical / Trunk Assessment: Normal  Communication   Communication: No difficulties  Cognition Arousal/Alertness: Awake/alert Behavior During Therapy: WFL for tasks assessed/performed Overall Cognitive Status: Within Functional Limits for tasks assessed                                         General Comments      Exercises     Assessment/Plan    PT Assessment Patent does not need any further PT services  PT Problem List         PT Treatment Interventions      PT Goals (Current goals can be found in the Care Plan section)  Acute Rehab PT Goals Patient Stated Goal: return home with family to assist PT Goal Formulation: With patient/family Time For Goal Achievement: 12/10/18 Potential to Achieve Goals: Good    Frequency     Barriers to discharge        Co-evaluation               AM-PAC PT "6 Clicks" Mobility  Outcome Measure Help needed turning from your back to your side while in a flat bed without using bedrails?: None Help needed moving from lying on your back to sitting on the side of a flat bed without using bedrails?: None Help needed moving to and from a bed to a chair (including a wheelchair)?: None Help needed standing up from a chair using your arms (e.g., wheelchair or bedside chair)?: None Help needed to walk in hospital room?: None Help needed climbing 3-5 steps with a railing? : A Little 6 Click Score: 23    End of Session   Activity Tolerance: Patient tolerated treatment well Patient left: in bed;with call bell/phone within reach;with family/visitor present Nurse Communication: Mobility status PT Visit Diagnosis: Unsteadiness on feet (R26.81);Other abnormalities of gait and mobility (R26.89);Muscle weakness (generalized) (M62.81)    Time: 9201-0071 PT Time Calculation (min) (ACUTE ONLY): 26 min   Charges:   PT Evaluation $PT Eval Moderate Complexity: 1 Mod PT Treatments $Therapeutic Activity: 23-37 mins        12:33 PM, 12/10/18 Lonell Grandchild, MPT Physical Therapist with [facility] [phone] office [phone] mobile phone

## 2018-12-19 DIAGNOSIS — Z6829 Body mass index (BMI) 29.0-29.9, adult: Secondary | ICD-10-CM | POA: Diagnosis not present

## 2018-12-19 DIAGNOSIS — I10 Essential (primary) hypertension: Secondary | ICD-10-CM | POA: Diagnosis not present

## 2018-12-19 DIAGNOSIS — Z299 Encounter for prophylactic measures, unspecified: Secondary | ICD-10-CM | POA: Diagnosis not present

## 2018-12-19 DIAGNOSIS — G459 Transient cerebral ischemic attack, unspecified: Secondary | ICD-10-CM | POA: Diagnosis not present

## 2018-12-19 DIAGNOSIS — R42 Dizziness and giddiness: Secondary | ICD-10-CM | POA: Diagnosis not present

## 2018-12-19 DIAGNOSIS — K5190 Ulcerative colitis, unspecified, without complications: Secondary | ICD-10-CM | POA: Diagnosis not present

## 2019-01-02 DIAGNOSIS — H938X2 Other specified disorders of left ear: Secondary | ICD-10-CM | POA: Diagnosis not present

## 2019-01-02 DIAGNOSIS — H9312 Tinnitus, left ear: Secondary | ICD-10-CM | POA: Diagnosis not present

## 2019-01-02 DIAGNOSIS — R0789 Other chest pain: Secondary | ICD-10-CM | POA: Diagnosis not present

## 2019-01-02 DIAGNOSIS — H903 Sensorineural hearing loss, bilateral: Secondary | ICD-10-CM | POA: Diagnosis not present

## 2019-01-02 DIAGNOSIS — R42 Dizziness and giddiness: Secondary | ICD-10-CM | POA: Diagnosis not present

## 2019-01-02 DIAGNOSIS — R519 Headache, unspecified: Secondary | ICD-10-CM | POA: Diagnosis not present

## 2019-01-20 DIAGNOSIS — K5190 Ulcerative colitis, unspecified, without complications: Secondary | ICD-10-CM | POA: Diagnosis not present

## 2019-01-20 DIAGNOSIS — I10 Essential (primary) hypertension: Secondary | ICD-10-CM | POA: Diagnosis not present

## 2019-01-20 DIAGNOSIS — Z6829 Body mass index (BMI) 29.0-29.9, adult: Secondary | ICD-10-CM | POA: Diagnosis not present

## 2019-01-20 DIAGNOSIS — Z299 Encounter for prophylactic measures, unspecified: Secondary | ICD-10-CM | POA: Diagnosis not present

## 2019-01-20 DIAGNOSIS — G8929 Other chronic pain: Secondary | ICD-10-CM | POA: Diagnosis not present

## 2019-01-20 DIAGNOSIS — F1120 Opioid dependence, uncomplicated: Secondary | ICD-10-CM | POA: Diagnosis not present

## 2019-02-11 ENCOUNTER — Encounter (HOSPITAL_COMMUNITY): Payer: Self-pay | Admitting: Psychiatry

## 2019-02-11 ENCOUNTER — Ambulatory Visit (INDEPENDENT_AMBULATORY_CARE_PROVIDER_SITE_OTHER): Payer: PPO | Admitting: Psychiatry

## 2019-02-11 ENCOUNTER — Other Ambulatory Visit: Payer: Self-pay

## 2019-02-11 DIAGNOSIS — F321 Major depressive disorder, single episode, moderate: Secondary | ICD-10-CM

## 2019-02-11 MED ORDER — LAMOTRIGINE 100 MG PO TABS: ORAL_TABLET | ORAL | 3 refills | Status: DC

## 2019-02-11 MED ORDER — ALPRAZOLAM 0.5 MG PO TABS: 0.5000 mg | ORAL_TABLET | Freq: Three times a day (TID) | ORAL | 3 refills | Status: DC

## 2019-02-11 MED ORDER — FLUOXETINE HCL 20 MG PO CAPS: 20.0000 mg | ORAL_CAPSULE | ORAL | 3 refills | Status: DC

## 2019-02-11 NOTE — Progress Notes (Signed)
Virtual Visit via Video Note  I connected with Stephen Porter on 02/11/19 at  1:20 PM EDT by a video enabled telemedicine application and verified that I am speaking with the correct person using two identifiers.   I discussed the limitations of evaluation and management by telemedicine and the availability of in person appointments. The patient expressed understanding and agreed to proceed.     I discussed the assessment and treatment plan with the patient. The patient was provided an opportunity to ask questions and all were answered. The patient agreed with the plan and demonstrated an understanding of the instructions.   The patient was advised to call back or seek an in-person evaluation if the symptoms worsen or if the condition fails to improve as anticipated.  I provided 15 minutes of non-face-to-face time during this encounter.   Levonne Spiller, MD  [facility] MD/PA/NP OP Progress Note  02/11/2019 1:50 PM Stephen Porter  MRN:  [id number]  Chief Complaint:  Chief Complaint    Depression; Anxiety; Follow-up     HPI: This patient is a 63 year old married white male who lives with his wife in [place]. He has 2 grown daughters one son and 3 grandchildren. 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  The patient was referred by his primary physician, Dr. Brigitte Pulse, for further assessment and treatment of depression anxiety and chronic pain.  The patient states that as a younger person he never had any issues with depression. 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 In 2003 he had a neck injury and has had 3 surgeries on it and in 2005 had a back injury and had to have surgery on this as well. He stated that after the back surgery he was in a great deal of pain and dysfunction. He was definitely no longer able to work and he was very frustrated with this. He stated  that the back surgeon gave him a high dosage of pain medication and he became addicted. He had to go to [facility] for detox and he claimed he was detoxed without any help with medication. A few months later he had to get on fentanyl patches for the pain.  In 2013 he was hospitalized [facility] because he was so depressed and felt like nothing was worth living for. His father was declining as well and died shortly after this. While there he was started on Cymbalta Lamictal and several other medications which he no longer takes. He was followed here for a time by Dr. walker and Maurice Small for counseling. Eventually however Dr. Manuella Ghazi took over his treatment  The patient returns after 3 months.  In reviewing his records and discussion with him this revealed that he had a CVA in March.  This came on with sudden diminishment of vision and weakness as well as heaviness in his left leg.  It resolved quickly.  His MRI did not show any abnormalities nor did his CT scan.  He did fine in the hospital but was found to have elevated lipids and is now on medication for this.  He quit smoking 3 years ago.  He states he has had no further episodes but it did scare him a lot.  In general his mood is pretty good but the coronavirus is gotten him down a bit because he cannot get out too  much but he spending most of his time taking care of his grandchildren.  He feels like his medications continue to help manage his depression and anxiety.  His sleep is okay but is impacted by pain in his shoulders and back.  He denies suicidal ideation Visit Diagnosis:    ICD-10-CM   1. Current moderate episode of major depressive disorder without prior episode (HCC) F32.1 ALPRAZolam (XANAX) 0.5 MG tablet    Past Psychiatric History: Prior hospitalizations at [place] in the behavioral [facility]  Past Medical History:  Past Medical History:  Diagnosis Date  . Bipolar disorder ([place])   . DDD  (degenerative disc disease)   . Depression   . Hyperlipidemia   . Insomnia   . Jejunal ulcer   . Leukocytosis 04/12/2016  . Skin rash     Past Surgical History:  Procedure Laterality Date  . BACK SURGERY    . CERVICAL SPINE SURGERY     x3  . CHOLECYSTECTOMY    . COLONOSCOPY  2006  . COLONOSCOPY N/A 06/30/2014   Procedure: COLONOSCOPY;  Surgeon: Rogene Houston, MD;  Location: [facility];  Service: Endoscopy;  Laterality: N/A;  830  . LUMBAR SPINE SURGERY    . UPPER GASTROINTESTINAL ENDOSCOPY      Family Psychiatric History: See below  Family History:  Family History  Problem Relation Age of Onset  . Hypertension Sister   . Anxiety disorder Sister   . Hypertension Brother   . Hypertension Brother   . Hypertension Brother   . Dementia Mother   . Paranoid behavior Mother   . Dementia Father   . Alcohol abuse Paternal Uncle   . Drug abuse Paternal Uncle   . ADD / ADHD Neg Hx   . Depression Neg Hx   . OCD Neg Hx   . Schizophrenia Neg Hx   . Seizures Neg Hx   . Sexual abuse Neg Hx   . Physical abuse Neg Hx   . Colon cancer Neg Hx     Social History:  Social History   Socioeconomic History  . Marital status: Married    Spouse name: Not on file  . Number of children: Not on file  . Years of education: Not on file  . Highest education level: Not on file  Occupational History  . Occupation: Scientist, research (life sciences): UNEMPLOYED  Social Needs  . Financial resource strain: Not on file  . Food insecurity:    Worry: Not on file    Inability: Not on file  . Transportation needs:    Medical: Not on file    Non-medical: Not on file  Tobacco Use  . Smoking status: Former Smoker    Packs/day: 1.00    Years: 40.00    Pack years: 40.00    Types: Cigarettes  . Smokeless tobacco: Current User  . Tobacco comment: 07-05-2016 Dip, 01-22-2017 per pt he stopped Sep 25, 2016  Substance and Sexual Activity  . Alcohol use: No    Comment: 07-05-2016 per pt no  . Drug use: No     Comment: 07-05-2016 per pt no and he stopped Marijuana 1 yr ago  . Sexual activity: Not on file  Lifestyle  . Physical activity:    Days per week: Not on file    Minutes per session: Not on file  . Stress: Not on file  Relationships  . Social connections:    Talks on phone: Not on file    Gets  together: Not on file    Attends religious service: Not on file    Active member of club or organization: Not on file    Attends meetings of clubs or organizations: Not on file    Relationship status: Not on file  Other Topics Concern  . Not on file  Social History Narrative   Married with 3 children and lives locally   No regular exercise    Allergies:  Allergies  Allergen Reactions  . Elavil [Amitriptyline] Other (See Comments)    Shaky    Metabolic Disorder Labs: Lab Results  Component Value Date   HGBA1C 5.4 12/10/2018   MPG 108.28 12/10/2018   No results found for: PROLACTIN Lab Results  Component Value Date   CHOL 212 (H) 12/10/2018   TRIG 347 (H) 12/10/2018   HDL 34 (L) 12/10/2018   CHOLHDL 6.2 12/10/2018   VLDL 69 (H) 12/10/2018   LDLCALC 109 (H) 12/10/2018   LDLCALC See Comment mg/dL 07/04/2010   Lab Results  Component Value Date   TSH 0.959 02/23/2012   TSH 1.237 07/04/2010    Therapeutic Level Labs: No results found for: LITHIUM No results found for: VALPROATE No components found for:  CBMZ  Current Medications: Current Outpatient Medications  Medication Sig Dispense Refill  . ALPRAZolam (XANAX) 0.5 MG tablet Take 1 tablet (0.5 mg total) by mouth 3 (three) times daily. 90 tablet 3  . amLODipine (NORVASC) 5 MG tablet Take 5 mg by mouth at bedtime.     Marland Kitchen aspirin EC 325 MG tablet Take 1 tablet (325 mg total) by mouth daily. 30 tablet 3  . baclofen (LIORESAL) 10 MG tablet Take 10 mg by mouth daily.    Marland Kitchen ezetimibe (ZETIA) 10 MG tablet Take 1 tablet (10 mg total) by mouth daily. 30 tablet 3  . fentaNYL (DURAGESIC - DOSED MCG/HR) 50 MCG/HR Apply 50 mcg/hr  topically every 3 (three) days.    Marland Kitchen FLUoxetine (PROZAC) 20 MG capsule Take 1 capsule (20 mg total) by mouth every morning. 30 capsule 3  . lamoTRIgine (LAMICTAL) 100 MG tablet Taking 0.5 tablets in AM 1 tablet at Night 60 tablet 3  . nicotine (NICODERM CQ - DOSED IN MG/24 HOURS) 14 mg/24hr patch Place 1 patch (14 mg total) onto the skin daily. 28 patch 0  . omega-3 acid ethyl esters (LOVAZA) 1 g capsule Take 1 capsule (1 g total) by mouth 2 (two) times daily. 60 capsule 1   No current facility-administered medications for this visit.      Musculoskeletal: Strength & Muscle Tone: within normal limits Gait & Station: normal Patient leans: N/A  Psychiatric Specialty Exam: Review of Systems  Musculoskeletal: Positive for back pain, myalgias and neck pain.  All other systems reviewed and are negative.   There were no vitals taken for this visit.There is no height or weight on file to calculate BMI.  General Appearance: Casual and Fairly Groomed  Eye Contact:  Good  Speech:  Clear and Coherent  Volume:  Normal  Mood:  Euthymic  Affect:  Appropriate and Congruent  Thought Process:  Goal Directed  Orientation:  Full (Time, Place, and Person)  Thought Content: WDL   Suicidal Thoughts:  No  Homicidal Thoughts:  No  Memory:  Immediate;   Good Recent;   Good Remote;   Fair  Judgement:  Good  Insight:  Good  Psychomotor Activity:  Normal  Concentration:  Concentration: Good and Attention Span: Good  Recall:  [place]  of Knowledge: Fair  Language: Good  Akathisia:  No  Handed:  Right  AIMS (if indicated): not done  Assets:  Communication Skills Desire for Improvement Resilience Social Support Talents/Skills  ADL's:  Intact  Cognition: WNL  Sleep:  Fair   Screenings:   Assessment and Plan: This patient is a 64 year old male with a history of depression and anxiety.  He has had a recent CVA which has cleared.  He is doing well on his current regimen so we will continue  Xanax 0.5 mg 3 times daily for anxiety, Prozac 20 mg daily for depression and Lamictal 50 mg in the morning and 100 mg in the evening for mood stabilization.  He will return to see me in 3 months   Levonne Spiller, MD 02/11/2019, 1:50 PM

## 2019-02-18 DIAGNOSIS — Z125 Encounter for screening for malignant neoplasm of prostate: Secondary | ICD-10-CM | POA: Diagnosis not present

## 2019-02-18 DIAGNOSIS — E7800 Pure hypercholesterolemia, unspecified: Secondary | ICD-10-CM | POA: Diagnosis not present

## 2019-02-18 DIAGNOSIS — Z6829 Body mass index (BMI) 29.0-29.9, adult: Secondary | ICD-10-CM | POA: Diagnosis not present

## 2019-02-18 DIAGNOSIS — Z1331 Encounter for screening for depression: Secondary | ICD-10-CM | POA: Diagnosis not present

## 2019-02-18 DIAGNOSIS — Z0000 Encounter for general adult medical examination without abnormal findings: Secondary | ICD-10-CM | POA: Diagnosis not present

## 2019-02-18 DIAGNOSIS — R5383 Other fatigue: Secondary | ICD-10-CM | POA: Diagnosis not present

## 2019-02-18 DIAGNOSIS — I10 Essential (primary) hypertension: Secondary | ICD-10-CM | POA: Diagnosis not present

## 2019-02-18 DIAGNOSIS — Z1211 Encounter for screening for malignant neoplasm of colon: Secondary | ICD-10-CM | POA: Diagnosis not present

## 2019-02-18 DIAGNOSIS — Z299 Encounter for prophylactic measures, unspecified: Secondary | ICD-10-CM | POA: Diagnosis not present

## 2019-02-18 DIAGNOSIS — Z1339 Encounter for screening examination for other mental health and behavioral disorders: Secondary | ICD-10-CM | POA: Diagnosis not present

## 2019-02-18 DIAGNOSIS — Z79899 Other long term (current) drug therapy: Secondary | ICD-10-CM | POA: Diagnosis not present

## 2019-02-18 DIAGNOSIS — Z7189 Other specified counseling: Secondary | ICD-10-CM | POA: Diagnosis not present

## 2019-03-23 DIAGNOSIS — Z299 Encounter for prophylactic measures, unspecified: Secondary | ICD-10-CM | POA: Diagnosis not present

## 2019-03-23 DIAGNOSIS — I10 Essential (primary) hypertension: Secondary | ICD-10-CM | POA: Diagnosis not present

## 2019-03-23 DIAGNOSIS — F1120 Opioid dependence, uncomplicated: Secondary | ICD-10-CM | POA: Diagnosis not present

## 2019-03-23 DIAGNOSIS — E7800 Pure hypercholesterolemia, unspecified: Secondary | ICD-10-CM | POA: Diagnosis not present

## 2019-03-23 DIAGNOSIS — Z6829 Body mass index (BMI) 29.0-29.9, adult: Secondary | ICD-10-CM | POA: Diagnosis not present

## 2019-04-22 DIAGNOSIS — Z299 Encounter for prophylactic measures, unspecified: Secondary | ICD-10-CM | POA: Diagnosis not present

## 2019-04-22 DIAGNOSIS — Z79899 Other long term (current) drug therapy: Secondary | ICD-10-CM | POA: Diagnosis not present

## 2019-04-22 DIAGNOSIS — F1120 Opioid dependence, uncomplicated: Secondary | ICD-10-CM | POA: Diagnosis not present

## 2019-04-22 DIAGNOSIS — G8929 Other chronic pain: Secondary | ICD-10-CM | POA: Diagnosis not present

## 2019-04-22 DIAGNOSIS — Z6829 Body mass index (BMI) 29.0-29.9, adult: Secondary | ICD-10-CM | POA: Diagnosis not present

## 2019-04-22 DIAGNOSIS — K5190 Ulcerative colitis, unspecified, without complications: Secondary | ICD-10-CM | POA: Diagnosis not present

## 2019-04-22 DIAGNOSIS — I10 Essential (primary) hypertension: Secondary | ICD-10-CM | POA: Diagnosis not present

## 2019-05-22 DIAGNOSIS — M545 Low back pain: Secondary | ICD-10-CM | POA: Diagnosis not present

## 2019-05-22 DIAGNOSIS — I10 Essential (primary) hypertension: Secondary | ICD-10-CM | POA: Diagnosis not present

## 2019-05-22 DIAGNOSIS — F1120 Opioid dependence, uncomplicated: Secondary | ICD-10-CM | POA: Diagnosis not present

## 2019-05-22 DIAGNOSIS — Z6829 Body mass index (BMI) 29.0-29.9, adult: Secondary | ICD-10-CM | POA: Diagnosis not present

## 2019-05-22 DIAGNOSIS — Z299 Encounter for prophylactic measures, unspecified: Secondary | ICD-10-CM | POA: Diagnosis not present

## 2019-05-22 DIAGNOSIS — K5190 Ulcerative colitis, unspecified, without complications: Secondary | ICD-10-CM | POA: Diagnosis not present

## 2019-05-22 DIAGNOSIS — Z87891 Personal history of nicotine dependence: Secondary | ICD-10-CM | POA: Diagnosis not present

## 2019-06-22 DIAGNOSIS — I10 Essential (primary) hypertension: Secondary | ICD-10-CM | POA: Diagnosis not present

## 2019-06-22 DIAGNOSIS — M545 Low back pain: Secondary | ICD-10-CM | POA: Diagnosis not present

## 2019-06-22 DIAGNOSIS — Z23 Encounter for immunization: Secondary | ICD-10-CM | POA: Diagnosis not present

## 2019-06-22 DIAGNOSIS — Z299 Encounter for prophylactic measures, unspecified: Secondary | ICD-10-CM | POA: Diagnosis not present

## 2019-06-22 DIAGNOSIS — E669 Obesity, unspecified: Secondary | ICD-10-CM | POA: Diagnosis not present

## 2019-06-22 DIAGNOSIS — F329 Major depressive disorder, single episode, unspecified: Secondary | ICD-10-CM | POA: Diagnosis not present

## 2019-06-22 DIAGNOSIS — Z6830 Body mass index (BMI) 30.0-30.9, adult: Secondary | ICD-10-CM | POA: Diagnosis not present

## 2019-06-25 ENCOUNTER — Encounter (INDEPENDENT_AMBULATORY_CARE_PROVIDER_SITE_OTHER): Payer: Self-pay | Admitting: *Deleted

## 2019-07-22 DIAGNOSIS — I10 Essential (primary) hypertension: Secondary | ICD-10-CM | POA: Diagnosis not present

## 2019-07-22 DIAGNOSIS — Z6828 Body mass index (BMI) 28.0-28.9, adult: Secondary | ICD-10-CM | POA: Diagnosis not present

## 2019-07-22 DIAGNOSIS — Z299 Encounter for prophylactic measures, unspecified: Secondary | ICD-10-CM | POA: Diagnosis not present

## 2019-07-22 DIAGNOSIS — M542 Cervicalgia: Secondary | ICD-10-CM | POA: Diagnosis not present

## 2019-07-22 DIAGNOSIS — F1120 Opioid dependence, uncomplicated: Secondary | ICD-10-CM | POA: Diagnosis not present

## 2019-08-21 ENCOUNTER — Other Ambulatory Visit (HOSPITAL_COMMUNITY): Payer: Self-pay | Admitting: Psychiatry

## 2019-08-21 DIAGNOSIS — Z299 Encounter for prophylactic measures, unspecified: Secondary | ICD-10-CM | POA: Diagnosis not present

## 2019-08-21 DIAGNOSIS — E7800 Pure hypercholesterolemia, unspecified: Secondary | ICD-10-CM | POA: Diagnosis not present

## 2019-08-21 DIAGNOSIS — I10 Essential (primary) hypertension: Secondary | ICD-10-CM | POA: Diagnosis not present

## 2019-08-21 DIAGNOSIS — F321 Major depressive disorder, single episode, moderate: Secondary | ICD-10-CM

## 2019-08-21 DIAGNOSIS — Z6828 Body mass index (BMI) 28.0-28.9, adult: Secondary | ICD-10-CM | POA: Diagnosis not present

## 2019-08-21 DIAGNOSIS — F329 Major depressive disorder, single episode, unspecified: Secondary | ICD-10-CM | POA: Diagnosis not present

## 2019-08-24 NOTE — Telephone Encounter (Signed)
PER PROVIDER: Call pt for appt: LVM ON H# &  M# M.B. FULL

## 2019-08-24 NOTE — Telephone Encounter (Signed)
Call pt for appt 

## 2019-09-14 ENCOUNTER — Other Ambulatory Visit (HOSPITAL_COMMUNITY): Payer: Self-pay | Admitting: Psychiatry

## 2019-09-14 NOTE — Telephone Encounter (Signed)
Call for appt

## 2019-09-14 NOTE — Telephone Encounter (Signed)
Per provider : Call for appt  LVM to call back to schedule appt

## 2019-09-21 DIAGNOSIS — Z299 Encounter for prophylactic measures, unspecified: Secondary | ICD-10-CM | POA: Diagnosis not present

## 2019-09-21 DIAGNOSIS — M542 Cervicalgia: Secondary | ICD-10-CM | POA: Diagnosis not present

## 2019-09-21 DIAGNOSIS — I10 Essential (primary) hypertension: Secondary | ICD-10-CM | POA: Diagnosis not present

## 2019-09-21 DIAGNOSIS — Z6829 Body mass index (BMI) 29.0-29.9, adult: Secondary | ICD-10-CM | POA: Diagnosis not present

## 2019-10-07 ENCOUNTER — Other Ambulatory Visit (HOSPITAL_COMMUNITY): Payer: Self-pay | Admitting: Psychiatry

## 2019-10-07 DIAGNOSIS — F321 Major depressive disorder, single episode, moderate: Secondary | ICD-10-CM

## 2019-10-07 NOTE — Telephone Encounter (Signed)
Per Provider: Call for appt LVM REQUESTING CB TO SCHEDULE APPT  L.O.V. 02/11/2019

## 2019-10-07 NOTE — Telephone Encounter (Signed)
Call for appt

## 2019-10-08 ENCOUNTER — Encounter (HOSPITAL_COMMUNITY): Payer: Self-pay | Admitting: Psychiatry

## 2019-10-08 ENCOUNTER — Other Ambulatory Visit: Payer: Self-pay

## 2019-10-08 ENCOUNTER — Ambulatory Visit (INDEPENDENT_AMBULATORY_CARE_PROVIDER_SITE_OTHER): Payer: PPO | Admitting: Psychiatry

## 2019-10-08 DIAGNOSIS — F321 Major depressive disorder, single episode, moderate: Secondary | ICD-10-CM

## 2019-10-08 MED ORDER — ALPRAZOLAM 0.5 MG PO TABS: 0.5000 mg | ORAL_TABLET | Freq: Three times a day (TID) | ORAL | 2 refills | Status: DC

## 2019-10-08 MED ORDER — FLUOXETINE HCL 20 MG PO CAPS: ORAL_CAPSULE | ORAL | 2 refills | Status: DC

## 2019-10-08 MED ORDER — LAMOTRIGINE 100 MG PO TABS: ORAL_TABLET | ORAL | 3 refills | Status: DC

## 2019-10-08 MED ORDER — ZOLPIDEM TARTRATE 10 MG PO TABS: 10.0000 mg | ORAL_TABLET | Freq: Every evening | ORAL | 2 refills | Status: DC | PRN

## 2019-10-08 NOTE — Progress Notes (Signed)
Virtual Visit via Video Note  I connected with Stephen Porter on 10/08/19 at  9:20 AM EST by a video enabled telemedicine application and verified that I am speaking with the correct person using two identifiers.   I discussed the limitations of evaluation and management by telemedicine and the availability of in person appointments. The patient expressed understanding and agreed to proceed   I discussed the assessment and treatment plan with the patient. The patient was provided an opportunity to ask questions and all were answered. The patient agreed with the plan and demonstrated an understanding of the instructions.   The patient was advised to call back or seek an in-person evaluation if the symptoms worsen or if the condition fails to improve as anticipated.  I provided 15 minutes of non-face-to-face time during this encounter.   Levonne Spiller, MD  [facility] MD/PA/NP OP Progress Note  10/08/2019 9:58 AM Stephen Porter  MRN:  [id number]  Chief Complaint:  Chief Complaint    Depression; Anxiety; Follow-up     HPI: This patient is a 64 year old married white male who lives with his wife in [place]. He has 2 grown daughters one son and 3 grandchildren. 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  The patient was referred by his primary physician, Dr. Brigitte Pulse, for further assessment and treatment of depression anxiety and chronic pain.  The patient states that as a younger person he never had any issues with depression. 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 In 2003 he had a neck injury and has had 3 surgeries on it and in 2005 had a back injury and had to have surgery on this as well. He stated that after the back surgery he was in a great deal of pain and dysfunction. He was definitely no longer able to work and he was very frustrated with this. He stated that  the back surgeon gave him a high dosage of pain medication and he became addicted. He had to go to [facility] for detox and he claimed he was detoxed without any help with medication. A few months later he had to get on fentanyl patches for the pain.  In 2013 he was [facility] because he was so depressed and felt like nothing was worth living for. His father was declining as well and died shortly after this. While there he was started on Cymbalta Lamictal and several other medications which he no longer takes. He was followed here for a time by Dr. walker and Maurice Small for counseling. Eventually however Dr. Manuella Ghazi took over his treatment  The patient returns for follow-up after about 7 months.  His 42 year old mother is in the hospital for pneumonia and he is her primary caregiver.  He spends all of his free time at the hospital trying to coax her to eat etc.  This is making his back and neck pain much worse and he is very anxious.  She is now into palliative care and he knows the end is coming up.  He and his mother have always been extremely close and this is very difficult for him.  I offered to set him up for counseling with our office but he would like to wait on this.  He denies severe depression or suicidal ideation.  Since he is not sleeping well he would like  to try something to help and Ambien has worked in the past.  Visit Diagnosis, depression and anxiety Past Psychiatric History: Prior hospitalizations at [facility] and the behavioral health hospital  Past Medical History:  Past Medical History:  Diagnosis Date  . Bipolar disorder ([place])   . DDD (degenerative disc disease)   . Depression   . Hyperlipidemia   . Insomnia   . Jejunal ulcer   . Leukocytosis 04/12/2016  . Skin rash     Past Surgical History:  Procedure Laterality Date  . BACK SURGERY    . CERVICAL SPINE SURGERY     x3  . CHOLECYSTECTOMY    . COLONOSCOPY  2006  . COLONOSCOPY  N/A 06/30/2014   Procedure: COLONOSCOPY;  Surgeon: Rogene Houston, MD;  [facility];  Service: Endoscopy;  Laterality: N/A;  830  . LUMBAR SPINE SURGERY    . UPPER GASTROINTESTINAL ENDOSCOPY      Family Psychiatric History: see below  Family History:  Family History  Problem Relation Age of Onset  . Hypertension Sister   . Anxiety disorder Sister   . Hypertension Brother   . Hypertension Brother   . Hypertension Brother   . Dementia Mother   . Paranoid behavior Mother   . Dementia Father   . Alcohol abuse Paternal Uncle   . Drug abuse Paternal Uncle   . ADD / ADHD Neg Hx   . Depression Neg Hx   . OCD Neg Hx   . Schizophrenia Neg Hx   . Seizures Neg Hx   . Sexual abuse Neg Hx   . Physical abuse Neg Hx   . Colon cancer Neg Hx     Social History:  Social History   Socioeconomic History  . Marital status: Married    Spouse name: Not on file  . Number of children: Not on file  . Years of education: Not on file  . Highest education level: Not on file  Occupational History  . Occupation: Scientist, research (life sciences): UNEMPLOYED  Tobacco Use  . Smoking status: Former Smoker    Packs/day: 1.00    Years: 40.00    Pack years: 40.00    Types: Cigarettes  . Smokeless tobacco: Current User  . Tobacco comment: 07-05-2016 Dip, 01-22-2017 per pt he stopped Sep 25, 2016  Substance and Sexual Activity  . Alcohol use: No    Comment: 07-05-2016 per pt no  . Drug use: No    Comment: 07-05-2016 per pt no and he stopped Marijuana 1 yr ago  . Sexual activity: Not on file  Other Topics Concern  . Not on file  Social History Narrative   Married with 3 children and lives locally   No regular exercise   Social Determinants of Health   Financial Resource Strain:   . Difficulty of Paying Living Expenses: Not on file  Food Insecurity:   . Worried About Charity fundraiser in the Last Year: Not on file  . Ran Out of Food in the Last Year: Not on file  Transportation Needs:   .  Lack of Transportation (Medical): Not on file  . Lack of Transportation (Non-Medical): Not on file  Physical Activity:   . Days of Exercise per Week: Not on file  . Minutes of Exercise per Session: Not on file  Stress:   . Feeling of Stress : Not on file  Social Connections:   . Frequency of Communication with Friends and Family: Not on  file  . Frequency of Social Gatherings with Friends and Family: Not on file  . Attends Religious Services: Not on file  . Active Member of Clubs or Organizations: Not on file  . Attends Archivist Meetings: Not on file  . Marital Status: Not on file    Allergies:  Allergies  Allergen Reactions  . Elavil [Amitriptyline] Other (See Comments)    Shaky    Metabolic Disorder Labs: Lab Results  Component Value Date   HGBA1C 5.4 12/10/2018   MPG 108.28 12/10/2018   No results found for: PROLACTIN Lab Results  Component Value Date   CHOL 212 (H) 12/10/2018   TRIG 347 (H) 12/10/2018   HDL 34 (L) 12/10/2018   CHOLHDL 6.2 12/10/2018   VLDL 69 (H) 12/10/2018   LDLCALC 109 (H) 12/10/2018   LDLCALC See Comment mg/dL 07/04/2010   Lab Results  Component Value Date   TSH 0.959 02/23/2012   TSH 1.237 07/04/2010    Therapeutic Level Labs: No results found for: LITHIUM No results found for: VALPROATE No components found for:  CBMZ  Current Medications: Current Outpatient Medications  Medication Sig Dispense Refill  . ALPRAZolam (XANAX) 0.5 MG tablet TAKE 1 TABLET BY MOUTH THREE TIMES DAILY 90 tablet 0  . amLODipine (NORVASC) 5 MG tablet Take 5 mg by mouth at bedtime.     Marland Kitchen aspirin EC 325 MG tablet Take 1 tablet (325 mg total) by mouth daily. 30 tablet 3  . baclofen (LIORESAL) 10 MG tablet Take 10 mg by mouth daily.    Marland Kitchen ezetimibe (ZETIA) 10 MG tablet Take 1 tablet (10 mg total) by mouth daily. 30 tablet 3  . fentaNYL (DURAGESIC - DOSED MCG/HR) 50 MCG/HR Apply 50 mcg/hr topically every 3 (three) days.    Marland Kitchen FLUoxetine (PROZAC) 20 MG  capsule TAKE 1 TABLET BY MOUTH DAILY 30 capsule 3  . lamoTRIgine (LAMICTAL) 100 MG tablet Taking 0.5 tablets in AM 1 tablet at Night 60 tablet 3  . nicotine (NICODERM CQ - DOSED IN MG/24 HOURS) 14 mg/24hr patch Place 1 patch (14 mg total) onto the skin daily. 28 patch 0  . omega-3 acid ethyl esters (LOVAZA) 1 g capsule Take 1 capsule (1 g total) by mouth 2 (two) times daily. 60 capsule 1   No current facility-administered medications for this visit.     Musculoskeletal: Strength & Muscle Tone: within normal limits Gait & Station: normal Patient leans: N/A  Psychiatric Specialty Exam: Review of Systems  Psychiatric/Behavioral: Positive for sleep disturbance. The patient is nervous/anxious.   All other systems reviewed and are negative.   There were no vitals taken for this visit.There is no height or weight on file to calculate BMI.  General Appearance: Casual and Fairly Groomed  Eye Contact:  Good  Speech:  Clear and Coherent  Volume:  Normal  Mood:  Anxious  Affect:  Appropriate and Congruent  Thought Process:  Goal Directed  Orientation:  Full (Time, Place, and Person)  Thought Content: Rumination   Suicidal Thoughts:  No  Homicidal Thoughts:  No  Memory:  Immediate;   Good Recent;   Good Remote;   Good  Judgement:  Good  Insight:  Good  Psychomotor Activity:  Normal  Concentration:  Concentration: Good and Attention Span: Good  Recall:  Good  Fund of Knowledge: Good  Language: Good  Akathisia:  No  Handed:  Right  AIMS (if indicated): not done  Assets:  Communication Skills Desire for Improvement Resilience Social  Support Talents/Skills  ADL's:  Intact  Cognition: WNL  Sleep:  Poor   Screenings:   Assessment and Plan: This patient is a 65 year old male with a history of depression anxiety.  His anxiety and insomnia worsened since his mother's decline.  We will add Ambien 10 mg at bedtime to help with sleep.  He will continue Xanax 0.5 mg 3 times daily for  anxiety, Prozac 20 mg daily for depression and Lamictal 50 mg in the morning and 100 mg in the evening for mood stabilization he will return to see me in 6 weeks or call sooner as needed   Levonne Spiller, MD 10/08/2019, 9:58 AM

## 2019-10-22 DIAGNOSIS — Z299 Encounter for prophylactic measures, unspecified: Secondary | ICD-10-CM | POA: Diagnosis not present

## 2019-10-22 DIAGNOSIS — Z87891 Personal history of nicotine dependence: Secondary | ICD-10-CM | POA: Diagnosis not present

## 2019-10-22 DIAGNOSIS — K5190 Ulcerative colitis, unspecified, without complications: Secondary | ICD-10-CM | POA: Diagnosis not present

## 2019-10-22 DIAGNOSIS — F1120 Opioid dependence, uncomplicated: Secondary | ICD-10-CM | POA: Diagnosis not present

## 2019-10-22 DIAGNOSIS — Z6830 Body mass index (BMI) 30.0-30.9, adult: Secondary | ICD-10-CM | POA: Diagnosis not present

## 2019-10-22 DIAGNOSIS — M542 Cervicalgia: Secondary | ICD-10-CM | POA: Diagnosis not present

## 2019-10-22 DIAGNOSIS — I10 Essential (primary) hypertension: Secondary | ICD-10-CM | POA: Diagnosis not present

## 2019-11-20 DIAGNOSIS — Z6830 Body mass index (BMI) 30.0-30.9, adult: Secondary | ICD-10-CM | POA: Diagnosis not present

## 2019-11-20 DIAGNOSIS — Z87891 Personal history of nicotine dependence: Secondary | ICD-10-CM | POA: Diagnosis not present

## 2019-11-20 DIAGNOSIS — Z299 Encounter for prophylactic measures, unspecified: Secondary | ICD-10-CM | POA: Diagnosis not present

## 2019-11-20 DIAGNOSIS — F1120 Opioid dependence, uncomplicated: Secondary | ICD-10-CM | POA: Diagnosis not present

## 2019-11-20 DIAGNOSIS — K5190 Ulcerative colitis, unspecified, without complications: Secondary | ICD-10-CM | POA: Diagnosis not present

## 2019-11-20 DIAGNOSIS — I10 Essential (primary) hypertension: Secondary | ICD-10-CM | POA: Diagnosis not present

## 2019-11-20 DIAGNOSIS — G8929 Other chronic pain: Secondary | ICD-10-CM | POA: Diagnosis not present

## 2019-11-22 DIAGNOSIS — E7800 Pure hypercholesterolemia, unspecified: Secondary | ICD-10-CM | POA: Diagnosis not present

## 2019-11-22 DIAGNOSIS — M791 Myalgia, unspecified site: Secondary | ICD-10-CM | POA: Diagnosis not present

## 2019-11-26 ENCOUNTER — Encounter (HOSPITAL_COMMUNITY): Payer: Self-pay | Admitting: Psychiatry

## 2019-11-26 ENCOUNTER — Ambulatory Visit (INDEPENDENT_AMBULATORY_CARE_PROVIDER_SITE_OTHER): Payer: PPO | Admitting: Psychiatry

## 2019-11-26 ENCOUNTER — Other Ambulatory Visit: Payer: Self-pay

## 2019-11-26 DIAGNOSIS — F321 Major depressive disorder, single episode, moderate: Secondary | ICD-10-CM

## 2019-11-26 MED ORDER — ALPRAZOLAM 0.5 MG PO TABS: 0.5000 mg | ORAL_TABLET | Freq: Three times a day (TID) | ORAL | 2 refills | Status: DC

## 2019-11-26 MED ORDER — LAMOTRIGINE 100 MG PO TABS: ORAL_TABLET | ORAL | 3 refills | Status: DC

## 2019-11-26 MED ORDER — ZOLPIDEM TARTRATE 10 MG PO TABS: 10.0000 mg | ORAL_TABLET | Freq: Every evening | ORAL | 2 refills | Status: DC | PRN

## 2019-11-26 MED ORDER — FLUOXETINE HCL 20 MG PO CAPS: ORAL_CAPSULE | ORAL | 2 refills | Status: DC

## 2019-11-26 NOTE — Progress Notes (Signed)
Virtual Visit via Telephone Note  I connected with Stephen Porter on 11/26/19 at  1:00 PM EST by telephone and verified that I am speaking with the correct person using two identifiers.   I discussed the limitations, risks, security and privacy concerns of performing an evaluation and management service by telephone and the availability of in person appointments. I also discussed with the patient that there may be a patient responsible charge related to this service. The patient expressed understanding and agreed to proceed.     I discussed the assessment and treatment plan with the patient. The patient was provided an opportunity to ask questions and all were answered. The patient agreed with the plan and demonstrated an understanding of the instructions.   The patient was advised to call back or seek an in-person evaluation if the symptoms worsen or if the condition fails to improve as anticipated.  I provided 15 minutes of non-face-to-face time during this encounter.   Levonne Spiller, MD  [facility] MD/PA/NP OP Progress Note  11/26/2019 1:20 PM Stephen Porter  MRN:  [id number]  Chief Complaint:  Chief Complaint    Depression; Anxiety; Follow-up     HPI: This patient is a 64 year old married white male who lives with his wife in [place]. He has 2 grown daughters one son and 3 grandchildren. 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  The patient was referred by his primary physician, Dr. Brigitte Pulse, for further assessment and treatment of depression anxiety and chronic pain.  The patient states that as a younger person he never had any issues with depression. 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 In 2003 he had a neck injury and has had 3 surgeries on it and in 2005 had a back injury and had to have surgery on this as well. He stated that after the back surgery he  was in a great deal of pain and dysfunction. He was definitely no longer able to work and he was very frustrated with this. He stated that the back surgeon gave him a high dosage of pain medication and he became addicted. He had to go to [facility] for detox and he claimed he was detoxed without any help with medication. A few months later he had to get on fentanyl patches for the pain.  The patient returns for follow-up after 6 weeks. Last time his mother had been in the hospital with pneumonia and he had been spending a lot of time trying to help her feed her etc. His mother is now in a nursing home and he is not allowed to visit but he talks with her every day through [place]. Her pneumonia has resolved but she still has dementia and sometimes wanders off in conversation. She is still not eating well and this worries him but he feels that she is in good hands. He has been able to do more things for himself like work in his shop and go outside on nice days and his mood has brightened. He is also sleeping better with the Ambien. He denies serious depression anxiety or suicidal ideation. Visit Diagnosis:    ICD-10-CM   1. Current moderate episode of major depressive disorder without prior episode (HCC)  F32.1 ALPRAZolam (XANAX) 0.5 MG tablet    Past Psychiatric History: Prior hospitalizations at [facility] and the behavioral health hospital  Past Medical History:  Past Medical History:  Diagnosis Date  . Bipolar disorder ([place])   . DDD (degenerative disc disease)   . Depression   . Hyperlipidemia   . Insomnia   . Jejunal ulcer   . Leukocytosis 04/12/2016  . Skin rash     Past Surgical History:  Procedure Laterality Date  . BACK SURGERY    . CERVICAL SPINE SURGERY     x3  . CHOLECYSTECTOMY    . COLONOSCOPY  2006  . COLONOSCOPY N/A 06/30/2014   Procedure: COLONOSCOPY;  Surgeon: Rogene Houston, MD;  [facility];  Service: Endoscopy;  Laterality: N/A;  830  . LUMBAR  SPINE SURGERY    . UPPER GASTROINTESTINAL ENDOSCOPY      Family Psychiatric History: see below  Family History:  Family History  Problem Relation Age of Onset  . Hypertension Sister   . Anxiety disorder Sister   . Hypertension Brother   . Hypertension Brother   . Hypertension Brother   . Dementia Mother   . Paranoid behavior Mother   . Dementia Father   . Alcohol abuse Paternal Uncle   . Drug abuse Paternal Uncle   . ADD / ADHD Neg Hx   . Depression Neg Hx   . OCD Neg Hx   . Schizophrenia Neg Hx   . Seizures Neg Hx   . Sexual abuse Neg Hx   . Physical abuse Neg Hx   . Colon cancer Neg Hx     Social History:  Social History   Socioeconomic History  . Marital status: Married    Spouse name: Not on file  . Number of children: Not on file  . Years of education: Not on file  . Highest education level: Not on file  Occupational History  . Occupation: Scientist, research (life sciences): UNEMPLOYED  Tobacco Use  . Smoking status: Former Smoker    Packs/day: 1.00    Years: 40.00    Pack years: 40.00    Types: Cigarettes  . Smokeless tobacco: Current User  . Tobacco comment: 07-05-2016 Dip, 01-22-2017 per pt he stopped Sep 25, 2016  Substance and Sexual Activity  . Alcohol use: No    Comment: 07-05-2016 per pt no  . Drug use: No    Comment: 07-05-2016 per pt no and he stopped Marijuana 1 yr ago  . Sexual activity: Not on file  Other Topics Concern  . Not on file  Social History Narrative   Married with 3 children and lives locally   No regular exercise   Social Determinants of Health   Financial Resource Strain:   . Difficulty of Paying Living Expenses: Not on file  Food Insecurity:   . Worried About Charity fundraiser in the Last Year: Not on file  . Ran Out of Food in the Last Year: Not on file  Transportation Needs:   . Lack of Transportation (Medical): Not on file  . Lack of Transportation (Non-Medical): Not on file  Physical Activity:   . Days of Exercise per Week:  Not on file  . Minutes of Exercise per Session: Not on file  Stress:   . Feeling of Stress : Not on file  Social Connections:   . Frequency of Communication with Friends and Family: Not on file  . Frequency of Social Gatherings with Friends and Family: Not on file  . Attends Religious Services: Not on file  . Active Member of Clubs or Organizations: Not on  file  . Attends Archivist Meetings: Not on file  . Marital Status: Not on file    Allergies:  Allergies  Allergen Reactions  . Elavil [Amitriptyline] Other (See Comments)    Shaky    Metabolic Disorder Labs: Lab Results  Component Value Date   HGBA1C 5.4 12/10/2018   MPG 108.28 12/10/2018   No results found for: PROLACTIN Lab Results  Component Value Date   CHOL 212 (H) 12/10/2018   TRIG 347 (H) 12/10/2018   HDL 34 (L) 12/10/2018   CHOLHDL 6.2 12/10/2018   VLDL 69 (H) 12/10/2018   LDLCALC 109 (H) 12/10/2018   LDLCALC See Comment mg/dL 07/04/2010   Lab Results  Component Value Date   TSH 0.959 02/23/2012   TSH 1.237 07/04/2010    Therapeutic Level Labs: No results found for: LITHIUM No results found for: VALPROATE No components found for:  CBMZ  Current Medications: Current Outpatient Medications  Medication Sig Dispense Refill  . ALPRAZolam (XANAX) 0.5 MG tablet Take 1 tablet (0.5 mg total) by mouth 3 (three) times daily. 90 tablet 2  . amLODipine (NORVASC) 5 MG tablet Take 5 mg by mouth at bedtime.     Marland Kitchen aspirin EC 325 MG tablet Take 1 tablet (325 mg total) by mouth daily. 30 tablet 3  . baclofen (LIORESAL) 10 MG tablet Take 10 mg by mouth daily.    Marland Kitchen ezetimibe (ZETIA) 10 MG tablet Take 1 tablet (10 mg total) by mouth daily. 30 tablet 3  . fentaNYL (DURAGESIC - DOSED MCG/HR) 50 MCG/HR Apply 50 mcg/hr topically every 3 (three) days.    Marland Kitchen FLUoxetine (PROZAC) 20 MG capsule TAKE 1 TABLET BY MOUTH DAILY 30 capsule 2  . lamoTRIgine (LAMICTAL) 100 MG tablet Taking 0.5 tablets in AM 1 tablet at Night 60  tablet 3  . nicotine (NICODERM CQ - DOSED IN MG/24 HOURS) 14 mg/24hr patch Place 1 patch (14 mg total) onto the skin daily. 28 patch 0  . omega-3 acid ethyl esters (LOVAZA) 1 g capsule Take 1 capsule (1 g total) by mouth 2 (two) times daily. 60 capsule 1  . zolpidem (AMBIEN) 10 MG tablet Take 1 tablet (10 mg total) by mouth at bedtime as needed for sleep. 30 tablet 2   No current facility-administered medications for this visit.     Musculoskeletal: Strength & Muscle Tone: within normal limits Gait & Station: normal Patient leans: N/A  Psychiatric Specialty Exam: Review of Systems  Musculoskeletal: Positive for arthralgias, back pain and neck pain.  All other systems reviewed and are negative.   There were no vitals taken for this visit.There is no height or weight on file to calculate BMI.  General Appearance: NA  Eye Contact:  NA  Speech:  Clear and Coherent  Volume:  Normal  Mood:  Euthymic  Affect:  Appropriate and Congruent  Thought Process:  Goal Directed  Orientation:  Full (Time, Place, and Person)  Thought Content: WDL   Suicidal Thoughts:  No  Homicidal Thoughts:  No  Memory:  Immediate;   Good Recent;   Good Remote;   Good  Judgement:  Good  Insight:  Good  Psychomotor Activity:  Normal  Concentration:  Concentration: Good and Attention Span: Good  Recall:  Good  Fund of Knowledge: Good  Language: Good  Akathisia:  No  Handed:  Right  AIMS (if indicated): not done  Assets:  Communication Skills Desire for Improvement Resilience Social Support Talents/Skills  ADL's:  Intact  Cognition:  WNL  Sleep:  Good   Screenings:   Assessment and Plan: This patient is a 64 year old male with a history of depression and anxiety. He is doing better now that he knows that his mom is stable. He is also sleeping better with Ambien 10 mg at bedtime so this will be continued. He will continue Xanax 0.5 mg 3 times daily for anxiety, Prozac 20 mg daily for depression and  Lamictal 50 mg in the morning and 100 mg in the evening for mood stabilization. He will return to see me in 3 months   Levonne Spiller, MD 11/26/2019, 1:20 PM

## 2019-12-17 DIAGNOSIS — Z299 Encounter for prophylactic measures, unspecified: Secondary | ICD-10-CM | POA: Diagnosis not present

## 2019-12-17 DIAGNOSIS — I10 Essential (primary) hypertension: Secondary | ICD-10-CM | POA: Diagnosis not present

## 2019-12-17 DIAGNOSIS — M545 Low back pain: Secondary | ICD-10-CM | POA: Diagnosis not present

## 2020-01-20 DIAGNOSIS — M542 Cervicalgia: Secondary | ICD-10-CM | POA: Diagnosis not present

## 2020-01-20 DIAGNOSIS — K5190 Ulcerative colitis, unspecified, without complications: Secondary | ICD-10-CM | POA: Diagnosis not present

## 2020-01-20 DIAGNOSIS — Z299 Encounter for prophylactic measures, unspecified: Secondary | ICD-10-CM | POA: Diagnosis not present

## 2020-01-20 DIAGNOSIS — I10 Essential (primary) hypertension: Secondary | ICD-10-CM | POA: Diagnosis not present

## 2020-01-20 DIAGNOSIS — Z6830 Body mass index (BMI) 30.0-30.9, adult: Secondary | ICD-10-CM | POA: Diagnosis not present

## 2020-02-19 DIAGNOSIS — Z1331 Encounter for screening for depression: Secondary | ICD-10-CM | POA: Diagnosis not present

## 2020-02-19 DIAGNOSIS — Z0000 Encounter for general adult medical examination without abnormal findings: Secondary | ICD-10-CM | POA: Diagnosis not present

## 2020-02-19 DIAGNOSIS — I10 Essential (primary) hypertension: Secondary | ICD-10-CM | POA: Diagnosis not present

## 2020-02-19 DIAGNOSIS — F1120 Opioid dependence, uncomplicated: Secondary | ICD-10-CM | POA: Diagnosis not present

## 2020-02-19 DIAGNOSIS — Z7189 Other specified counseling: Secondary | ICD-10-CM | POA: Diagnosis not present

## 2020-02-19 DIAGNOSIS — Z1339 Encounter for screening examination for other mental health and behavioral disorders: Secondary | ICD-10-CM | POA: Diagnosis not present

## 2020-02-19 DIAGNOSIS — Z1211 Encounter for screening for malignant neoplasm of colon: Secondary | ICD-10-CM | POA: Diagnosis not present

## 2020-02-19 DIAGNOSIS — Z299 Encounter for prophylactic measures, unspecified: Secondary | ICD-10-CM | POA: Diagnosis not present

## 2020-02-19 DIAGNOSIS — Z79899 Other long term (current) drug therapy: Secondary | ICD-10-CM | POA: Diagnosis not present

## 2020-02-19 DIAGNOSIS — Z125 Encounter for screening for malignant neoplasm of prostate: Secondary | ICD-10-CM | POA: Diagnosis not present

## 2020-02-19 DIAGNOSIS — E7800 Pure hypercholesterolemia, unspecified: Secondary | ICD-10-CM | POA: Diagnosis not present

## 2020-02-19 DIAGNOSIS — R5383 Other fatigue: Secondary | ICD-10-CM | POA: Diagnosis not present

## 2020-02-19 DIAGNOSIS — Z6830 Body mass index (BMI) 30.0-30.9, adult: Secondary | ICD-10-CM | POA: Diagnosis not present

## 2020-02-22 DIAGNOSIS — I10 Essential (primary) hypertension: Secondary | ICD-10-CM | POA: Diagnosis not present

## 2020-02-22 DIAGNOSIS — M791 Myalgia, unspecified site: Secondary | ICD-10-CM | POA: Diagnosis not present

## 2020-02-24 ENCOUNTER — Other Ambulatory Visit: Payer: Self-pay

## 2020-02-24 ENCOUNTER — Encounter (HOSPITAL_COMMUNITY): Payer: Self-pay | Admitting: Psychiatry

## 2020-02-24 ENCOUNTER — Telehealth (INDEPENDENT_AMBULATORY_CARE_PROVIDER_SITE_OTHER): Payer: PPO | Admitting: Psychiatry

## 2020-02-24 DIAGNOSIS — F321 Major depressive disorder, single episode, moderate: Secondary | ICD-10-CM

## 2020-02-24 MED ORDER — FLUOXETINE HCL 20 MG PO CAPS: ORAL_CAPSULE | ORAL | 2 refills | Status: DC

## 2020-02-24 MED ORDER — LAMOTRIGINE 100 MG PO TABS: 100.0000 mg | ORAL_TABLET | Freq: Two times a day (BID) | ORAL | 3 refills | Status: DC

## 2020-02-24 MED ORDER — ALPRAZOLAM 0.5 MG PO TABS: 0.5000 mg | ORAL_TABLET | Freq: Four times a day (QID) | ORAL | 2 refills | Status: DC

## 2020-02-24 NOTE — Progress Notes (Signed)
Virtual Visit via Telephone Note  I connected with Stephen Porter on 02/24/20 at 11:00 AM EDT by telephone and verified that I am speaking with the correct person using two identifiers.   I discussed the limitations, risks, security and privacy concerns of performing an evaluation and management service by telephone and the availability of in person appointments. I also discussed with the patient that there may be a patient responsible charge related to this service. The patient expressed understanding and agreed to proceed.    I discussed the assessment and treatment plan with the patient. The patient was provided an opportunity to ask questions and all were answered. The patient agreed with the plan and demonstrated an understanding of the instructions.   The patient was advised to call back or seek an in-person evaluation if the symptoms worsen or if the condition fails to improve as anticipated.  I provided 15 minutes of non-face-to-face time during this encounter.   Levonne Spiller, MD  [facility] MD/PA/NP OP Progress Note  02/24/2020 11:39 AM Stephen Porter  MRN:  [id number]  Chief Complaint:  Chief Complaint    Depression; Anxiety; Follow-up     HPI: This patient is a 64 year old married white male who lives with his wife in [place]. He has 2 grown daughters one son and 3 grandchildren. 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  The patient was referred by his primary physician, Dr. Brigitte Pulse, for further assessment and treatment of depression anxiety and chronic pain.  The patient states that as a younger person he never had any issues with depression. 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 In 2003 he had a neck injury and has had 3 surgeries on it and in 2005 had a back injury and had to have surgery on this as well. He stated that after the back surgery he  was in a great deal of pain and dysfunction. He was definitely no longer able to work and he was very frustrated with this. He stated that the back surgeon gave him a high dosage of pain medication and he became addicted. He had to go to [facility] for detox and he claimed he was detoxed without any help with medication. A few months later he had to get on fentanyl patches for the pain.  The patient returns for follow-up after 3 months. He states he is not doing as well lately. He had to stop the Ambien because it was making him feel fuzzy and caused headache. He is not sleeping well and has racing thoughts at night. His mother is now in a nursing home facility and he is able to visit her on weekends but he still worries a lot about her. He still having a lot of problems with neck pain even though he is on a fentanyl patch. He states in the past the Xanax helped him sleep and he probably needs to have one extra one to take at night so I will increase the quantity. I reminded him that this can sometimes cause excessive drowsiness in combination with narcotics and he needs to be careful with that. He agrees that if he feels lethargic he will not take the extra Xanax. He denies any thoughts of self-harm or suicidal ideation. I also suggested that we increase his Lamictal because of the increased recent racing thoughts Visit Diagnosis:  ICD-10-CM   1. Current moderate episode of major depressive disorder without prior episode (HCC)  F32.1 ALPRAZolam (XANAX) 0.5 MG tablet    Past Psychiatric History: [facility]  Past Medical History:  Past Medical History:  Diagnosis Date   Bipolar disorder ([place])    DDD (degenerative disc disease)    Depression    Hyperlipidemia    Insomnia    Jejunal ulcer    Leukocytosis 04/12/2016   Skin rash     Past Surgical History:  Procedure Laterality Date   BACK SURGERY     CERVICAL SPINE  SURGERY     x3   CHOLECYSTECTOMY     COLONOSCOPY  2006   COLONOSCOPY N/A 06/30/2014   Procedure: COLONOSCOPY;  Surgeon: Rogene [place], MD;  [facility];  Service: Endoscopy;  Laterality: N/A;  8   LUMBAR SPINE SURGERY     UPPER GASTROINTESTINAL ENDOSCOPY      Family Psychiatric History: see below  Family History:  Family History  Problem Relation Age of Onset   Hypertension Sister    Anxiety disorder Sister    Hypertension Brother    Hypertension Brother    Hypertension Brother    Dementia Mother    Paranoid behavior Mother    Dementia Father    Alcohol abuse Paternal Uncle    Drug abuse Paternal Uncle    ADD / ADHD Neg Hx    Depression Neg Hx    OCD Neg Hx    Schizophrenia Neg Hx    Seizures Neg Hx    Sexual abuse Neg Hx    Physical abuse Neg Hx    Colon cancer Neg Hx     Social History:  Social History   Socioeconomic History   Marital status: Married    Spouse name: Not on file   Number of children: Not on file   Years of education: Not on file   Highest education level: Not on file  Occupational History   Occupation: Scientist, research (life sciences): UNEMPLOYED  Tobacco Use   Smoking status: Former Smoker    Packs/day: 1.00    Years: 40.00    Pack years: 40.00    Types: Cigarettes   Smokeless tobacco: Current User   Tobacco comment: 07-05-2016 Dip, 01-22-2017 per pt he stopped Sep 25, 2016  Substance and Sexual Activity   Alcohol use: No    Comment: 07-05-2016 per pt no   Drug use: No    Comment: 07-05-2016 per pt no and he stopped Marijuana 1 yr ago   Sexual activity: Not on file  Other Topics Concern   Not on file  Social History Narrative   Married with 3 children and lives locally   No regular exercise   Social Determinants of Health   Financial Resource Strain:    Difficulty of Paying Living Expenses:   Food Insecurity:    Worried About Charity fundraiser in the Last Year:    Arboriculturist in  the Last Year:   Transportation Needs:    Film/video editor (Medical):    Lack of Transportation (Non-Medical):   Physical Activity:    Days of Exercise per Week:    Minutes of Exercise per Session:   Stress:    Feeling of Stress :   Social Connections:    Frequency of Communication with Friends and Family:    Frequency of Social Gatherings with Friends and Family:  Attends Religious Services:    Active Member of Clubs or Organizations:    Attends Archivist Meetings:    Marital Status:     Allergies:  Allergies  Allergen Reactions   Elavil [Amitriptyline] Other (See Comments)    Shaky    Metabolic Disorder Labs: Lab Results  Component Value Date   HGBA1C 5.4 12/10/2018   MPG 108.28 12/10/2018   No results found for: PROLACTIN Lab Results  Component Value Date   CHOL 212 (H) 12/10/2018   TRIG 347 (H) 12/10/2018   HDL 34 (L) 12/10/2018   CHOLHDL 6.2 12/10/2018   VLDL 69 (H) 12/10/2018   LDLCALC 109 (H) 12/10/2018   LDLCALC See Comment mg/dL 07/04/2010   Lab Results  Component Value Date   TSH 0.959 02/23/2012   TSH 1.237 07/04/2010    Therapeutic Level Labs: No results found for: LITHIUM No results found for: VALPROATE No components found for:  CBMZ  Current Medications: Current Outpatient Medications  Medication Sig Dispense Refill   ALPRAZolam (XANAX) 0.5 MG tablet Take 1 tablet (0.5 mg total) by mouth in the morning, at noon, in the evening, and at bedtime. 120 tablet 2   amLODipine (NORVASC) 5 MG tablet Take 5 mg by mouth at bedtime.      aspirin EC 325 MG tablet Take 1 tablet (325 mg total) by mouth daily. 30 tablet 3   baclofen (LIORESAL) 10 MG tablet Take 10 mg by mouth daily.     ezetimibe (ZETIA) 10 MG tablet Take 1 tablet (10 mg total) by mouth daily. 30 tablet 3   fentaNYL (DURAGESIC - DOSED MCG/HR) 50 MCG/HR Apply 50 mcg/hr topically every 3 (three) days.     FLUoxetine (PROZAC) 20 MG capsule TAKE 1 TABLET BY  MOUTH DAILY 30 capsule 2   lamoTRIgine (LAMICTAL) 100 MG tablet Take 1 tablet (100 mg total) by mouth 2 (two) times daily. 60 tablet 3   nicotine (NICODERM CQ - DOSED IN MG/24 HOURS) 14 mg/24hr patch Place 1 patch (14 mg total) onto the skin daily. 28 patch 0   omega-3 acid ethyl esters (LOVAZA) 1 g capsule Take 1 capsule (1 g total) by mouth 2 (two) times daily. 60 capsule 1   No current facility-administered medications for this visit.     Musculoskeletal: Strength & Muscle Tone: within normal limits Gait & Station: normal Patient leans: N/A  Psychiatric Specialty Exam: Review of Systems  Musculoskeletal: Positive for neck pain.  Psychiatric/Behavioral: Positive for sleep disturbance. The patient is nervous/anxious.   All other systems reviewed and are negative.   There were no vitals taken for this visit.There is no height or weight on file to calculate BMI.  General Appearance: NA  Eye Contact:  NA  Speech:  Clear and Coherent  Volume:  Normal  Mood:  Anxious  Affect:  NA  Thought Process:  Goal Directed  Orientation:  Full (Time, Place, and Person)  Thought Content: Rumination   Suicidal Thoughts:  No  Homicidal Thoughts:  No  Memory:  Immediate;   Good Recent;   Good Remote;   Good  Judgement:  Good  Insight:  Good  Psychomotor Activity:  Decreased  Concentration:  Concentration: Good and Attention Span: Good  Recall:  Good  Fund of Knowledge: Good  Language: Good  Akathisia:  No  Handed:  Right  AIMS (if indicated): not done  Assets:  Communication Skills Desire for Improvement Resilience Social Support Talents/Skills  ADL's:  Intact  Cognition: WNL  Sleep:  Poor   Screenings:   Assessment and Plan: This patient is a 64 year old male with a history of depression and anxiety. He is having more trouble with sleep and racing thoughts. He did not tolerate the Ambien well. He will therefore increase Xanax to 0.5 mg up to four times daily, using the last  pill at bedtime for sleep. He will continue Prozac 20 mg daily for depression. He will increase Lamictal to 100 mg twice daily for mood swings and racing thoughts. He will return to see me in 4 weeks   Levonne Spiller, MD 02/24/2020, 11:39 AM

## 2020-03-21 DIAGNOSIS — I10 Essential (primary) hypertension: Secondary | ICD-10-CM | POA: Diagnosis not present

## 2020-03-21 DIAGNOSIS — M542 Cervicalgia: Secondary | ICD-10-CM | POA: Diagnosis not present

## 2020-03-21 DIAGNOSIS — F1120 Opioid dependence, uncomplicated: Secondary | ICD-10-CM | POA: Diagnosis not present

## 2020-03-21 DIAGNOSIS — Z299 Encounter for prophylactic measures, unspecified: Secondary | ICD-10-CM | POA: Diagnosis not present

## 2020-04-20 DIAGNOSIS — M542 Cervicalgia: Secondary | ICD-10-CM | POA: Diagnosis not present

## 2020-04-20 DIAGNOSIS — F1120 Opioid dependence, uncomplicated: Secondary | ICD-10-CM | POA: Diagnosis not present

## 2020-04-20 DIAGNOSIS — K5190 Ulcerative colitis, unspecified, without complications: Secondary | ICD-10-CM | POA: Diagnosis not present

## 2020-04-20 DIAGNOSIS — Z299 Encounter for prophylactic measures, unspecified: Secondary | ICD-10-CM | POA: Diagnosis not present

## 2020-04-20 DIAGNOSIS — I10 Essential (primary) hypertension: Secondary | ICD-10-CM | POA: Diagnosis not present

## 2020-04-22 DIAGNOSIS — F329 Major depressive disorder, single episode, unspecified: Secondary | ICD-10-CM | POA: Diagnosis not present

## 2020-04-22 DIAGNOSIS — E785 Hyperlipidemia, unspecified: Secondary | ICD-10-CM | POA: Diagnosis not present

## 2020-04-22 DIAGNOSIS — I10 Essential (primary) hypertension: Secondary | ICD-10-CM | POA: Diagnosis not present

## 2020-05-18 DIAGNOSIS — Z299 Encounter for prophylactic measures, unspecified: Secondary | ICD-10-CM | POA: Diagnosis not present

## 2020-05-18 DIAGNOSIS — M542 Cervicalgia: Secondary | ICD-10-CM | POA: Diagnosis not present

## 2020-05-18 DIAGNOSIS — R0602 Shortness of breath: Secondary | ICD-10-CM | POA: Diagnosis not present

## 2020-05-18 DIAGNOSIS — G8929 Other chronic pain: Secondary | ICD-10-CM | POA: Diagnosis not present

## 2020-05-18 DIAGNOSIS — Z20822 Contact with and (suspected) exposure to covid-19: Secondary | ICD-10-CM | POA: Diagnosis not present

## 2020-05-18 DIAGNOSIS — F17210 Nicotine dependence, cigarettes, uncomplicated: Secondary | ICD-10-CM | POA: Diagnosis not present

## 2020-05-20 ENCOUNTER — Other Ambulatory Visit (HOSPITAL_COMMUNITY): Payer: Self-pay | Admitting: Psychiatry

## 2020-05-20 DIAGNOSIS — F321 Major depressive disorder, single episode, moderate: Secondary | ICD-10-CM

## 2020-05-24 ENCOUNTER — Encounter (HOSPITAL_COMMUNITY): Payer: Self-pay | Admitting: Psychiatry

## 2020-05-24 ENCOUNTER — Other Ambulatory Visit: Payer: Self-pay

## 2020-05-24 ENCOUNTER — Telehealth (INDEPENDENT_AMBULATORY_CARE_PROVIDER_SITE_OTHER): Payer: PPO | Admitting: Psychiatry

## 2020-05-24 DIAGNOSIS — F321 Major depressive disorder, single episode, moderate: Secondary | ICD-10-CM | POA: Diagnosis not present

## 2020-05-24 MED ORDER — LAMOTRIGINE 100 MG PO TABS: 100.0000 mg | ORAL_TABLET | Freq: Two times a day (BID) | ORAL | 3 refills | Status: DC

## 2020-05-24 MED ORDER — ALPRAZOLAM 0.5 MG PO TABS: ORAL_TABLET | ORAL | 3 refills | Status: DC

## 2020-05-24 MED ORDER — FLUOXETINE HCL 20 MG PO CAPS: ORAL_CAPSULE | ORAL | 3 refills | Status: DC

## 2020-05-24 NOTE — Progress Notes (Signed)
Virtual Visit via Telephone Note  I connected with Stephen Porter on 05/24/20 at  9:20 AM EDT by telephone and verified that I am speaking with the correct person using two identifiers.   I discussed the limitations, risks, security and privacy concerns of performing an evaluation and management service by telephone and the availability of in person appointments. I also discussed with the patient that there may be a patient responsible charge related to this service. The patient expressed understanding and agreed to proceed.     I discussed the assessment and treatment plan with the patient. The patient was provided an opportunity to ask questions and all were answered. The patient agreed with the plan and demonstrated an understanding of the instructions.   The patient was advised to call back or seek an in-person evaluation if the symptoms worsen or if the condition fails to improve as anticipated.  I provided 15 minutes of non-face-to-face time during this encounter. Location: Provider Home, patient home  Levonne Spiller, MD  [facility] MD/PA/NP OP Progress Note  05/24/2020 9:42 AM Stephen Porter  MRN:  [id number]  Chief Complaint:  Chief Complaint    Depression; Anxiety; Follow-up     HPI: This patient is a 64 year old married white male who lives with his wife in [place]. He has 2 grown daughters one son and 3 grandchildren. 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  The patient was referred by his primary physician, Dr. Brigitte Pulse, for further assessment and treatment of depression anxiety and chronic pain.  The patient states that as a younger person he never had any issues with depression. 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 In 2003 he had a neck injury and has had 3 surgeries on it and in 2005 had a back injury and had to have surgery on this as well. He  stated that after the back surgery he was in a great deal of pain and dysfunction. He was definitely no longer able to work and he was very frustrated with this. He stated that the back surgeon gave him a high dosage of pain medication and he became addicted. He had to go to [facility] for detox and he claimed he was detoxed without any help with medication. A few months later he had to get on fentanyl patches for the pain.  The patient returns after about 6 months.  He has missed some appointments.  He states overall however he has had a good summer and is doing well.  He spending a lot of time with his grandchildren.  He is trying to get a little bit more exercise to strengthen his legs.  He still dealing with chronic neck and back pain but he is managing it fairly well.  He states that his mood is good he denies serious depression panic attacks or suicidal ideation.  He is sleeping well. Visit Diagnosis:    ICD-10-CM   1. Current moderate episode of major depressive disorder without prior episode (HCC)  F32.1 ALPRAZolam (XANAX) 0.5 MG tablet    Past Psychiatric History: Prior hospitalizations at [facility]  Past Medical History:  Past Medical History:  Diagnosis Date  . Bipolar disorder ([place])   . DDD (degenerative disc disease)   . Depression   . Hyperlipidemia   . Insomnia   . Jejunal ulcer   .  Leukocytosis 04/12/2016  . Skin rash     Past Surgical History:  Procedure Laterality Date  . BACK SURGERY    . CERVICAL SPINE SURGERY     x3  . CHOLECYSTECTOMY    . COLONOSCOPY  2006  . COLONOSCOPY N/A 06/30/2014   Procedure: COLONOSCOPY;  Surgeon: Rogene [place], MD;  Location[facility];  Service: Endoscopy;  Laterality: N/A;  830  . LUMBAR SPINE SURGERY    . UPPER GASTROINTESTINAL ENDOSCOPY      Family Psychiatric History: see below  Family History:  Family History  Problem Relation Age of Onset  . Hypertension Sister   . Anxiety  disorder Sister   . Hypertension Brother   . Hypertension Brother   . Hypertension Brother   . Dementia Mother   . Paranoid behavior Mother   . Dementia Father   . Alcohol abuse Paternal Uncle   . Drug abuse Paternal Uncle   . ADD / ADHD Neg Hx   . Depression Neg Hx   . OCD Neg Hx   . Schizophrenia Neg Hx   . Seizures Neg Hx   . Sexual abuse Neg Hx   . Physical abuse Neg Hx   . Colon cancer Neg Hx     Social History:  Social History   Socioeconomic History  . Marital status: Married    Spouse name: Not on file  . Number of children: Not on file  . Years of education: Not on file  . Highest education level: Not on file  Occupational History  . Occupation: Scientist, research (life sciences): UNEMPLOYED  Tobacco Use  . Smoking status: Former Smoker    Packs/day: 1.00    Years: 40.00    Pack years: 40.00    Types: Cigarettes  . Smokeless tobacco: Current User  . Tobacco comment: 07-05-2016 Dip, 01-22-2017 per pt he stopped Sep 25, 2016  Substance and Sexual Activity  . Alcohol use: No    Comment: 07-05-2016 per pt no  . Drug use: No    Comment: 07-05-2016 per pt no and he stopped Marijuana 1 yr ago  . Sexual activity: Not on file  Other Topics Concern  . Not on file  Social History Narrative   Married with 3 children and lives locally   No regular exercise   Social Determinants of Health   Financial Resource Strain:   . Difficulty of Paying Living Expenses: Not on file  Food Insecurity:   . Worried About Charity fundraiser in the Last Year: Not on file  . Ran Out of Food in the Last Year: Not on file  Transportation Needs:   . Lack of Transportation (Medical): Not on file  . Lack of Transportation (Non-Medical): Not on file  Physical Activity:   . Days of Exercise per Week: Not on file  . Minutes of Exercise per Session: Not on file  Stress:   . Feeling of Stress : Not on file  Social Connections:   . Frequency of Communication with Friends and Family: Not on file  .  Frequency of Social Gatherings with Friends and Family: Not on file  . Attends Religious Services: Not on file  . Active Member of Clubs or Organizations: Not on file  . Attends Archivist Meetings: Not on file  . Marital Status: Not on file    Allergies:  Allergies  Allergen Reactions  . Elavil [Amitriptyline] Other (See Comments)    Shaky    Metabolic  Disorder Labs: Lab Results  Component Value Date   HGBA1C 5.4 12/10/2018   MPG 108.28 12/10/2018   No results found for: PROLACTIN Lab Results  Component Value Date   CHOL 212 (H) 12/10/2018   TRIG 347 (H) 12/10/2018   HDL 34 (L) 12/10/2018   CHOLHDL 6.2 12/10/2018   VLDL 69 (H) 12/10/2018   LDLCALC 109 (H) 12/10/2018   [place] See Comment mg/dL 07/04/2010   Lab Results  Component Value Date   TSH 0.959 02/23/2012   TSH 1.237 07/04/2010    Therapeutic Level Labs: No results found for: LITHIUM No results found for: VALPROATE No components found for:  CBMZ  Current Medications: Current Outpatient Medications  Medication Sig Dispense Refill  . ALPRAZolam (XANAX) 0.5 MG tablet TAKE 1 TABLET BY MOUTH EVERY MORNING, AT NOON, EVERY EVENING and AT BEDTIME 120 tablet 3  . amLODipine (NORVASC) 5 MG tablet Take 5 mg by mouth at bedtime.     Marland Kitchen aspirin EC 325 MG tablet Take 1 tablet (325 mg total) by mouth daily. 30 tablet 3  . baclofen (LIORESAL) 10 MG tablet Take 10 mg by mouth daily.    Marland Kitchen ezetimibe (ZETIA) 10 MG tablet Take 1 tablet (10 mg total) by mouth daily. 30 tablet 3  . fentaNYL (DURAGESIC - DOSED MCG/HR) 50 MCG/HR Apply 50 mcg/hr topically every 3 (three) days.    Marland Kitchen FLUoxetine (PROZAC) 20 MG capsule TAKE 1 TABLET BY MOUTH DAILY 30 capsule 3  . lamoTRIgine (LAMICTAL) 100 MG tablet Take 1 tablet (100 mg total) by mouth 2 (two) times daily. 60 tablet 3  . nicotine (NICODERM CQ - DOSED IN MG/24 HOURS) 14 mg/24hr patch Place 1 patch (14 mg total) onto the skin daily. 28 patch 0  . omega-3 acid ethyl esters  (LOVAZA) 1 g capsule Take 1 capsule (1 g total) by mouth 2 (two) times daily. 60 capsule 1   No current facility-administered medications for this visit.     Musculoskeletal: Strength & Muscle Tone: within normal limits Gait & Station: normal Patient leans: N/A  Psychiatric Specialty Exam: Review of Systems  Musculoskeletal: Positive for back pain and neck pain.  All other systems reviewed and are negative.   There were no vitals taken for this visit.There is no height or weight on file to calculate BMI.  General Appearance: NA  Eye Contact:  NA  Speech:  Clear and Coherent  Volume:  Normal  Mood:  Euthymic  Affect:  NA  Thought Process:  Goal Directed  Orientation:  Full (Time, Place, and Person)  Thought Content: WDL   Suicidal Thoughts:  No  Homicidal Thoughts:  No  Memory:  Immediate;   Good Recent;   Good Remote;   Good  Judgement:  Good  Insight:  Good  Psychomotor Activity:  Normal  Concentration:  Concentration: Good and Attention Span: Good  Recall:  Good  Fund of Knowledge: Good  Language: Good  Akathisia:  No  Handed:  Right  AIMS (if indicated): not done  Assets:  Communication Skills Desire for Improvement Resilience Social Support Talents/Skills  ADL's:  Intact  Cognition: WNL  Sleep:  Good   Screenings:   Assessment and Plan: This patient is a 64 year old male with a history of depression and anxiety.  He continues to do well.  He will continue Xanax 0.5 mg 4 times daily for anxiety, Prozac 20 mg daily for depression and Lamictal 50 mg in the morning and 100 mg in the evening for  mood stabilization.  He will return to see me in 4 months   Levonne Spiller, MD 05/24/2020, 9:42 AM

## 2020-06-21 DIAGNOSIS — F1120 Opioid dependence, uncomplicated: Secondary | ICD-10-CM | POA: Diagnosis not present

## 2020-06-21 DIAGNOSIS — I10 Essential (primary) hypertension: Secondary | ICD-10-CM | POA: Diagnosis not present

## 2020-06-21 DIAGNOSIS — G8929 Other chronic pain: Secondary | ICD-10-CM | POA: Diagnosis not present

## 2020-06-21 DIAGNOSIS — Z6830 Body mass index (BMI) 30.0-30.9, adult: Secondary | ICD-10-CM | POA: Diagnosis not present

## 2020-06-21 DIAGNOSIS — Z299 Encounter for prophylactic measures, unspecified: Secondary | ICD-10-CM | POA: Diagnosis not present

## 2020-06-23 DIAGNOSIS — I10 Essential (primary) hypertension: Secondary | ICD-10-CM | POA: Diagnosis not present

## 2020-06-23 DIAGNOSIS — E785 Hyperlipidemia, unspecified: Secondary | ICD-10-CM | POA: Diagnosis not present

## 2020-06-23 DIAGNOSIS — F329 Major depressive disorder, single episode, unspecified: Secondary | ICD-10-CM | POA: Diagnosis not present

## 2020-07-21 DIAGNOSIS — I10 Essential (primary) hypertension: Secondary | ICD-10-CM | POA: Diagnosis not present

## 2020-07-21 DIAGNOSIS — F1120 Opioid dependence, uncomplicated: Secondary | ICD-10-CM | POA: Diagnosis not present

## 2020-07-21 DIAGNOSIS — Z6830 Body mass index (BMI) 30.0-30.9, adult: Secondary | ICD-10-CM | POA: Diagnosis not present

## 2020-07-21 DIAGNOSIS — Z299 Encounter for prophylactic measures, unspecified: Secondary | ICD-10-CM | POA: Diagnosis not present

## 2020-07-21 DIAGNOSIS — F17210 Nicotine dependence, cigarettes, uncomplicated: Secondary | ICD-10-CM | POA: Diagnosis not present

## 2020-07-21 DIAGNOSIS — M542 Cervicalgia: Secondary | ICD-10-CM | POA: Diagnosis not present

## 2020-07-28 ENCOUNTER — Other Ambulatory Visit: Payer: Self-pay | Admitting: Family Medicine

## 2020-07-28 ENCOUNTER — Other Ambulatory Visit (HOSPITAL_COMMUNITY): Payer: Self-pay | Admitting: Family Medicine

## 2020-07-28 DIAGNOSIS — M545 Low back pain, unspecified: Secondary | ICD-10-CM

## 2020-07-28 DIAGNOSIS — M542 Cervicalgia: Secondary | ICD-10-CM

## 2020-08-09 ENCOUNTER — Ambulatory Visit (HOSPITAL_COMMUNITY)
Admission: RE | Admit: 2020-08-09 | Discharge: 2020-08-09 | Disposition: A | Discharge status: Home / Self Care | Payer: PPO | Source: Ambulatory Visit | Attending: Family Medicine | Admitting: Family Medicine

## 2020-08-09 ENCOUNTER — Other Ambulatory Visit: Payer: Self-pay

## 2020-08-09 DIAGNOSIS — M545 Low back pain, unspecified: Secondary | ICD-10-CM

## 2020-08-09 DIAGNOSIS — M542 Cervicalgia: Secondary | ICD-10-CM | POA: Insufficient documentation

## 2020-08-12 DIAGNOSIS — K5190 Ulcerative colitis, unspecified, without complications: Secondary | ICD-10-CM | POA: Diagnosis not present

## 2020-08-12 DIAGNOSIS — Z299 Encounter for prophylactic measures, unspecified: Secondary | ICD-10-CM | POA: Diagnosis not present

## 2020-08-12 DIAGNOSIS — F1120 Opioid dependence, uncomplicated: Secondary | ICD-10-CM | POA: Diagnosis not present

## 2020-08-12 DIAGNOSIS — Z6830 Body mass index (BMI) 30.0-30.9, adult: Secondary | ICD-10-CM | POA: Diagnosis not present

## 2020-08-12 DIAGNOSIS — M50121 Cervical disc disorder at C4-C5 level with radiculopathy: Secondary | ICD-10-CM | POA: Diagnosis not present

## 2020-08-12 DIAGNOSIS — G8929 Other chronic pain: Secondary | ICD-10-CM | POA: Diagnosis not present

## 2020-08-23 DIAGNOSIS — I10 Essential (primary) hypertension: Secondary | ICD-10-CM | POA: Diagnosis not present

## 2020-08-23 DIAGNOSIS — E7800 Pure hypercholesterolemia, unspecified: Secondary | ICD-10-CM | POA: Diagnosis not present

## 2020-08-26 DIAGNOSIS — M5412 Radiculopathy, cervical region: Secondary | ICD-10-CM | POA: Diagnosis not present

## 2020-08-26 DIAGNOSIS — G959 Disease of spinal cord, unspecified: Secondary | ICD-10-CM | POA: Diagnosis not present

## 2020-08-26 DIAGNOSIS — M479 Spondylosis, unspecified: Secondary | ICD-10-CM | POA: Diagnosis not present

## 2020-08-26 DIAGNOSIS — Z6831 Body mass index (BMI) 31.0-31.9, adult: Secondary | ICD-10-CM | POA: Diagnosis not present

## 2020-09-01 DIAGNOSIS — M50021 Cervical disc disorder at C4-C5 level with myelopathy: Secondary | ICD-10-CM | POA: Diagnosis not present

## 2020-09-01 DIAGNOSIS — M50121 Cervical disc disorder at C4-C5 level with radiculopathy: Secondary | ICD-10-CM | POA: Diagnosis not present

## 2020-09-01 DIAGNOSIS — G959 Disease of spinal cord, unspecified: Secondary | ICD-10-CM | POA: Diagnosis not present

## 2020-09-14 DIAGNOSIS — G959 Disease of spinal cord, unspecified: Secondary | ICD-10-CM | POA: Diagnosis not present

## 2020-09-20 DIAGNOSIS — J029 Acute pharyngitis, unspecified: Secondary | ICD-10-CM | POA: Diagnosis not present

## 2020-09-20 DIAGNOSIS — Z299 Encounter for prophylactic measures, unspecified: Secondary | ICD-10-CM | POA: Diagnosis not present

## 2020-09-20 DIAGNOSIS — M542 Cervicalgia: Secondary | ICD-10-CM | POA: Diagnosis not present

## 2020-09-20 DIAGNOSIS — I10 Essential (primary) hypertension: Secondary | ICD-10-CM | POA: Diagnosis not present

## 2020-09-20 DIAGNOSIS — Z6830 Body mass index (BMI) 30.0-30.9, adult: Secondary | ICD-10-CM | POA: Diagnosis not present

## 2020-09-20 DIAGNOSIS — F329 Major depressive disorder, single episode, unspecified: Secondary | ICD-10-CM | POA: Diagnosis not present

## 2020-09-22 ENCOUNTER — Telehealth (INDEPENDENT_AMBULATORY_CARE_PROVIDER_SITE_OTHER): Payer: PPO | Admitting: Psychiatry

## 2020-09-22 ENCOUNTER — Encounter (HOSPITAL_COMMUNITY): Payer: Self-pay | Admitting: Psychiatry

## 2020-09-22 ENCOUNTER — Other Ambulatory Visit: Payer: Self-pay

## 2020-09-22 DIAGNOSIS — F321 Major depressive disorder, single episode, moderate: Secondary | ICD-10-CM | POA: Diagnosis not present

## 2020-09-22 MED ORDER — LAMOTRIGINE 100 MG PO TABS: 100.0000 mg | ORAL_TABLET | Freq: Two times a day (BID) | ORAL | 3 refills | Status: DC

## 2020-09-22 MED ORDER — FLUOXETINE HCL 20 MG PO CAPS: ORAL_CAPSULE | ORAL | 3 refills | Status: DC

## 2020-09-22 MED ORDER — ALPRAZOLAM 0.5 MG PO TABS: ORAL_TABLET | ORAL | 3 refills | Status: DC

## 2020-09-22 NOTE — Progress Notes (Signed)
Virtual Visit via Telephone Note  I connected with Stephen Porter on 09/22/20 at  9:40 AM EST by telephone and verified that I am speaking with the correct person using two identifiers.  Location: Patient: home Provider: home   I discussed the limitations, risks, security and privacy concerns of performing an evaluation and management service by telephone and the availability of in person appointments. I also discussed with the patient that there may be a patient responsible charge related to this service. The patient expressed understanding and agreed to proceed.      I discussed the assessment and treatment plan with the patient. The patient was provided an opportunity to ask questions and all were answered. The patient agreed with the plan and demonstrated an understanding of the instructions.   The patient was advised to call back or seek an in-person evaluation if the symptoms worsen or if the condition fails to improve as anticipated.  I provided 15 minutes of non-face-to-face time during this encounter.   Diannia Ruder, MD  [facility] MD/PA/NP OP Progress Note  09/22/2020 10:14 AM Stephen Porter  MRN:  [id number]  Chief Complaint:  Chief Complaint    Anxiety; Depression; Follow-up     HPI: This patient is a 64 year old married white male who lives with his wife in [place]. He has 2 grown daughters one son and 3 grandchildren. 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  The patient was referred by his primary physician, Dr. Clelia Croft, for further assessment and treatment of depression anxiety and chronic pain.  The patient states that as a younger person he never had any issues with depression. 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 In 2003 he had a neck injury and has had 3 surgeries on it and in 2005 had a back injury and had to have surgery on this  as well. He stated that after the back surgery he was in a great deal of pain and dysfunction. He was definitely no longer able to work and he was very frustrated with this. He stated that the back surgeon gave him a high dosage of pain medication and he became addicted. He had to go to [facility] for detox and he claimed he was detoxed without any help with medication. A few months later he had to get on fentanyl patches for the pain.  Patient returns for follow-up after 4 months.  He states that he had neck surgery earlier in the month.  He has had a rather slow recovery and is been feeling rather wobbly and tired.  He also got some sort of viral infection he is not sure if it is coronavirus because he has not been tested.  He is not vaccinated and plans to do so in the near future.  However his mood has been stable and he denies significant depression anxiety panic attacks or mood swings.  He denies suicidal ideation Visit Diagnosis:    ICD-10-CM   1. Current moderate episode of major depressive disorder without prior episode (HCC)  F32.1 ALPRAZolam (XANAX) 0.5 MG tablet    Past Psychiatric History: [facility] hospitalizations at [facility] the behavioral [facility]  Past Medical History:  Past Medical History:  Diagnosis Date  . Bipolar disorder (HCC)   . DDD (degenerative disc disease)   . Depression   . Hyperlipidemia   .  Insomnia   . Jejunal ulcer   . Leukocytosis 04/12/2016  . Skin rash     Past Surgical History:  Procedure Laterality Date  . BACK SURGERY    . CERVICAL SPINE SURGERY     x3  . CHOLECYSTECTOMY    . COLONOSCOPY  2006  . COLONOSCOPY N/A 06/30/2014   Procedure: COLONOSCOPY;  Surgeon: Malissa Hippo, MD;  Location: [facility];  Service: Endoscopy;  Laterality: N/A;  830  . LUMBAR SPINE SURGERY    . UPPER GASTROINTESTINAL ENDOSCOPY      Family Psychiatric History: see below  Family History:  Family History  Problem Relation Age of Onset  .  Hypertension Sister   . Anxiety disorder Sister   . Hypertension Brother   . Hypertension Brother   . Hypertension Brother   . Dementia Mother   . Paranoid behavior Mother   . Dementia Father   . Alcohol abuse Paternal Uncle   . Drug abuse Paternal Uncle   . ADD / ADHD Neg Hx   . Depression Neg Hx   . OCD Neg Hx   . Schizophrenia Neg Hx   . Seizures Neg Hx   . Sexual abuse Neg Hx   . Physical abuse Neg Hx   . Colon cancer Neg Hx     Social History:  Social History   Socioeconomic History  . Marital status: Married    Spouse name: Not on file  . Number of children: Not on file  . Years of education: Not on file  . Highest education level: Not on file  Occupational History  . Occupation: Fish farm manager: UNEMPLOYED  Tobacco Use  . Smoking status: Former Smoker    Packs/day: 1.00    Years: 40.00    Pack years: 40.00    Types: Cigarettes  . Smokeless tobacco: Current User  . Tobacco comment: 07-05-2016 Dip, 01-22-2017 per pt he stopped Sep 25, 2016  Substance and Sexual Activity  . Alcohol use: No    Comment: 07-05-2016 per pt no  . Drug use: No    Comment: 07-05-2016 per pt no and he stopped Marijuana 1 yr ago  . Sexual activity: Not on file  Other Topics Concern  . Not on file  Social History Narrative   Married with 3 children and lives locally   No regular exercise   Social Determinants of Health   Financial Resource Strain: Not on file  Food Insecurity: Not on file  Transportation Needs: Not on file  Physical Activity: Not on file  Stress: Not on file  Social Connections: Not on file    Allergies:  Allergies  Allergen Reactions  . Elavil [Amitriptyline] Other (See Comments)    Shaky    Metabolic Disorder Labs: Lab Results  Component Value Date   HGBA1C 5.4 12/10/2018   MPG 108.28 12/10/2018   No results found for: PROLACTIN Lab Results  Component Value Date   CHOL 212 (H) 12/10/2018   TRIG 347 (H) 12/10/2018   HDL 34 (L) 12/10/2018    CHOLHDL 6.2 12/10/2018   VLDL 69 (H) 12/10/2018   LDLCALC 109 (H) 12/10/2018   LDLCALC See Comment mg/dL 76/16/0737   Lab Results  Component Value Date   TSH 0.959 02/23/2012   TSH 1.237 07/04/2010    Therapeutic Level Labs: No results found for: LITHIUM No results found for: VALPROATE No components found for:  CBMZ  Current Medications: Current Outpatient Medications  Medication Sig Dispense Refill  .  ALPRAZolam (XANAX) 0.5 MG tablet TAKE 1 TABLET BY MOUTH EVERY MORNING, AT NOON, EVERY EVENING and AT BEDTIME 120 tablet 3  . amLODipine (NORVASC) 5 MG tablet Take 5 mg by mouth at bedtime.     Marland Kitchen aspirin EC 325 MG tablet Take 1 tablet (325 mg total) by mouth daily. 30 tablet 3  . baclofen (LIORESAL) 10 MG tablet Take 10 mg by mouth daily.    Marland Kitchen ezetimibe (ZETIA) 10 MG tablet Take 1 tablet (10 mg total) by mouth daily. 30 tablet 3  . fentaNYL (DURAGESIC - DOSED MCG/HR) 50 MCG/HR Apply 50 mcg/hr topically every 3 (three) days.    Marland Kitchen FLUoxetine (PROZAC) 20 MG capsule TAKE 1 TABLET BY MOUTH DAILY 30 capsule 3  . lamoTRIgine (LAMICTAL) 100 MG tablet Take 1 tablet (100 mg total) by mouth 2 (two) times daily. 60 tablet 3  . nicotine (NICODERM CQ - DOSED IN MG/24 HOURS) 14 mg/24hr patch Place 1 patch (14 mg total) onto the skin daily. 28 patch 0  . omega-3 acid ethyl esters (LOVAZA) 1 g capsule Take 1 capsule (1 g total) by mouth 2 (two) times daily. 60 capsule 1   No current facility-administered medications for this visit.     Musculoskeletal: Strength & Muscle Tone: within normal limits Gait & Station: normal Patient leans: N/A  Psychiatric Specialty Exam: Review of Systems  HENT: Positive for congestion.   Musculoskeletal: Positive for neck pain.  All other systems reviewed and are negative.   There were no vitals taken for this visit.There is no height or weight on file to calculate BMI.  General Appearance: NA  Eye Contact:  NA  Speech:  Clear and Coherent  Volume:  Normal   Mood:  Euthymic  Affect:  Appropriate and Congruent  Thought Process:  Goal Directed  Orientation:  Full (Time, Place, and Person)  Thought Content: Rumination   Suicidal Thoughts:  No  Homicidal Thoughts:  No  Memory:  Immediate;   Good Recent;   Good Remote;   Good  Judgement:  Good  Insight:  Good  Psychomotor Activity:  Decreased  Concentration:  Concentration: Good and Attention Span: Good  Recall:  Good  Fund of Knowledge: Good  Language: Good  Akathisia:  No  Handed:  Right  AIMS (if indicated): not done  Assets:  Communication Skills Desire for Improvement Resilience Social Support Talents/Skills  ADL's:  Intact  Cognition: WNL  Sleep:  Fair   Screenings:   Assessment and Plan: This patient is a 64 year old male with a history of depression and anxiety.  He continues to do fairly well.  He will continue Xanax 0.5 mg four times daily for anxiety, Prozac 20 mg daily for depression and Lamictal 50 mg in the morning and 100 mg in the evening for mood stabilization.  He will return to see me in 4 months   Levonne Spiller, MD 09/22/2020, 10:14 AM

## 2020-09-23 DIAGNOSIS — E7800 Pure hypercholesterolemia, unspecified: Secondary | ICD-10-CM | POA: Diagnosis not present

## 2020-09-23 DIAGNOSIS — I10 Essential (primary) hypertension: Secondary | ICD-10-CM | POA: Diagnosis not present

## 2020-10-01 ENCOUNTER — Ambulatory Visit (INDEPENDENT_AMBULATORY_CARE_PROVIDER_SITE_OTHER): Payer: PPO

## 2020-10-01 ENCOUNTER — Ambulatory Visit
Admission: EM | Admit: 2020-10-01 | Discharge: 2020-10-01 | Disposition: A | Discharge status: Home / Self Care | Payer: PPO | Attending: Family Medicine | Admitting: Family Medicine

## 2020-10-01 ENCOUNTER — Other Ambulatory Visit: Payer: Self-pay

## 2020-10-01 DIAGNOSIS — J209 Acute bronchitis, unspecified: Secondary | ICD-10-CM

## 2020-10-01 DIAGNOSIS — R062 Wheezing: Secondary | ICD-10-CM

## 2020-10-01 DIAGNOSIS — R059 Cough, unspecified: Secondary | ICD-10-CM

## 2020-10-01 MED ORDER — PROMETHAZINE-DM 6.25-15 MG/5ML PO SYRP: 5.0000 mL | ORAL_SOLUTION | Freq: Four times a day (QID) | ORAL | 0 refills | Status: DC | PRN

## 2020-10-01 MED ORDER — PREDNISONE 10 MG (21) PO TBPK: ORAL_TABLET | Freq: Every day | ORAL | 0 refills | Status: AC

## 2020-10-01 NOTE — ED Triage Notes (Signed)
Pt presents with c/o cough for past few weeks . Pt had surgery 12/8 and began cough shortly after that

## 2020-10-01 NOTE — Discharge Instructions (Signed)
I have sent in a prednisone taper for you to take for 6 days. 6 tablets on day one, 5 tablets on day two, 4 tablets on day three, 3 tablets on day four, 2 tablets on day five, and 1 tablet on day six.  I have sent in cough syrup for you to take. This medication can make you sleepy. Do not drive while taking this medication.  Follow up with this office or with primary care if symptoms are persisting.  Follow up in the ER for high fever, trouble swallowing, trouble breathing, other concerning symptoms.

## 2020-10-01 NOTE — ED Provider Notes (Addendum)
[place]   [id number] 10/01/20 Arrival Time: 1321   SUBJECTIVE:  Stephen Porter is a 65 y.o. male who presents with complaint of nasal congestion, post-nasal drainage, and a persistent cough. Onset abrupt approximately 2 weeks  ago. Overall fatigued. SOB: none. Wheezing: mild. Has negative history of Covid. Cough is worse at night. Declines Covid testing today. OTC treatment: Nyquil with little relief. Reports that he recently had neck surgery and is concerned about pneumonia. Social History   Tobacco Use  Smoking Status Former Smoker  . Packs/day: 1.00  . Years: 40.00  . Pack years: 40.00  . Types: Cigarettes  Smokeless Tobacco Current User  Tobacco Comment   07-05-2016 Dip, 01-22-2017 per pt he stopped Sep 25, 2016    ROS: As per HPI.   OBJECTIVE:  Vitals:   10/01/20 1418  BP: 104/69  Pulse: 87  Resp: 16  Temp: 98.6 F (37 C)  TempSrc: Oral  SpO2: 95%     General appearance: alert; no distress HEENT: nasal congestion; clear runny nose; throat irritation secondary to post-nasal drainage Neck: supple without LAD Lungs: diffuse wheezing bilaterally Skin: warm and dry Psychological: alert and cooperative; normal mood and affect  Results for orders placed or performed during the hospital encounter of 12/09/18  Ethanol  Result Value Ref Range   Alcohol, Ethyl (B) <10 <10 mg/dL  Protime-INR  Result Value Ref Range   Prothrombin Time 13.1 11.4 - 15.2 seconds   INR 1.0 0.8 - 1.2  APTT  Result Value Ref Range   aPTT 32 24 - 36 seconds  CBC  Result Value Ref Range   WBC 11.3 (H) 4.0 - 10.5 K/uL   RBC 5.34 4.22 - 5.81 MIL/uL   Hemoglobin 15.5 13.0 - 17.0 g/dL   HCT 47.0 39.0 - 52.0 %   MCV 88.0 80.0 - 100.0 fL   MCH 29.0 26.0 - 34.0 pg   MCHC 33.0 30.0 - 36.0 g/dL   RDW 13.4 11.5 - 15.5 %   Platelets 236 150 - 400 K/uL   nRBC 0.0 0.0 - 0.2 %  Differential  Result Value Ref Range   Neutrophils Relative % 75 %   Neutro Abs 8.6 (H) 1.7 - 7.7 K/uL    Lymphocytes Relative 14 %   Lymphs Abs 1.5 0.7 - 4.0 K/uL   Monocytes Relative 8 %   Monocytes Absolute 0.9 0.1 - 1.0 K/uL   Eosinophils Relative 2 %   Eosinophils Absolute 0.2 0.0 - 0.5 K/uL   Basophils Relative 0 %   Basophils Absolute 0.1 0.0 - 0.1 K/uL   Immature Granulocytes 1 %   Abs Immature Granulocytes 0.06 0.00 - 0.07 K/uL  Comprehensive metabolic panel  Result Value Ref Range   Sodium 136 135 - 145 mmol/L   Potassium 3.7 3.5 - 5.1 mmol/L   Chloride 104 98 - 111 mmol/L   CO2 24 22 - 32 mmol/L   Glucose, Bld 112 (H) 70 - 99 mg/dL   BUN 10 8 - 23 mg/dL   Creatinine, Ser 1.01 0.61 - 1.24 mg/dL   Calcium 9.1 8.9 - 10.3 mg/dL   Total Protein 7.2 6.5 - 8.1 g/dL   Albumin 4.0 3.5 - 5.0 g/dL   AST 26 15 - 41 U/L   ALT 34 0 - 44 U/L   Alkaline Phosphatase 83 38 - 126 U/L   Total Bilirubin 0.6 0.3 - 1.2 mg/dL   GFR calc non Af Amer >60 >60 mL/min  GFR calc Af Amer >60 >60 mL/min   Anion gap 8 5 - 15  Urine rapid drug screen (hosp performed)  Result Value Ref Range   Opiates NONE DETECTED NONE DETECTED   Cocaine NONE DETECTED NONE DETECTED   Benzodiazepines POSITIVE (A) NONE DETECTED   Amphetamines NONE DETECTED NONE DETECTED   Tetrahydrocannabinol POSITIVE (A) NONE DETECTED   Barbiturates NONE DETECTED NONE DETECTED  Urinalysis, Routine w reflex microscopic  Result Value Ref Range   Color, Urine STRAW (A) YELLOW   APPearance CLEAR CLEAR   Specific Gravity, Urine 1.018 1.005 - 1.030   pH 7.0 5.0 - 8.0   Glucose, UA NEGATIVE NEGATIVE mg/dL   Hgb urine dipstick NEGATIVE NEGATIVE   Bilirubin Urine NEGATIVE NEGATIVE   Ketones, ur NEGATIVE NEGATIVE mg/dL   Protein, ur NEGATIVE NEGATIVE mg/dL   Nitrite NEGATIVE NEGATIVE   Leukocytes,Ua NEGATIVE NEGATIVE  HIV antibody (Routine Testing)  Result Value Ref Range   HIV Screen 4th Generation wRfx Non Reactive Non Reactive  Lipid panel  Result Value Ref Range   Cholesterol 212 (H) 0 - 200 mg/dL   Triglycerides 347 (H)  <150 mg/dL   HDL 34 (L) >40 mg/dL   Total CHOL/HDL Ratio 6.2 RATIO   VLDL 69 (H) 0 - 40 mg/dL   LDL Cholesterol 109 (H) 0 - 99 mg/dL  Hemoglobin A1c  Result Value Ref Range   Hgb A1c MFr Bld 5.4 4.8 - 5.6 %   Mean Plasma Glucose 108.28 mg/dL  I-stat Creatinine, ED  Result Value Ref Range   Creatinine, Ser 1.10 0.61 - 1.24 mg/dL  CBG monitoring, ED  Result Value Ref Range   Glucose-Capillary 104 (H) 70 - 99 mg/dL  CBG monitoring, ED  Result Value Ref Range   Glucose-Capillary 118 (H) 70 - 99 mg/dL  ECHOCARDIOGRAM COMPLETE  Result Value Ref Range   Weight 3,440 oz   Height 68 in   BP 117/73 mmHg    Labs Reviewed - No data to display  DG Chest 2 View  Result Date: 10/01/2020 CLINICAL DATA:  Cough over the last several weeks. EXAM: CHEST - 2 VIEW COMPARISON:  12/19/2009 FINDINGS: Heart and mediastinal shadows are normal. There is central bronchial thickening consistent with bronchitis. No infiltrate, collapse or effusion. No significant bone finding. IMPRESSION: Bronchitis pattern. No consolidation or collapse. Electronically Signed   By: Nelson Chimes M.D.   On: 10/01/2020 14:40    Allergies  Allergen Reactions  . Elavil [Amitriptyline] Other (See Comments)    Shaky    Past Medical History:  Diagnosis Date  . Bipolar disorder ([place])   . DDD (degenerative disc disease)   . Depression   . Hyperlipidemia   . Insomnia   . Jejunal ulcer   . Leukocytosis 04/12/2016  . Skin rash    Social History   Socioeconomic History  . Marital status: Married    Spouse name: Not on file  . Number of children: Not on file  . Years of education: Not on file  . Highest education level: Not on file  Occupational History  . Occupation: Scientist, research (life sciences): UNEMPLOYED  Tobacco Use  . Smoking status: Former Smoker    Packs/day: 1.00    Years: 40.00    Pack years: 40.00    Types: Cigarettes  . Smokeless tobacco: Current User  . Tobacco comment: 07-05-2016 Dip, 01-22-2017 per pt he  stopped Sep 25, 2016  Substance and Sexual Activity  . Alcohol use: No  Comment: 07-05-2016 per pt no  . Drug use: No    Comment: 07-05-2016 per pt no and he stopped Marijuana 1 yr ago  . Sexual activity: Not on file  Other Topics Concern  . Not on file  Social History Narrative   Married with 3 children and lives locally   No regular exercise   Social Determinants of Health   Financial Resource Strain: Not on file  Food Insecurity: Not on file  Transportation Needs: Not on file  Physical Activity: Not on file  Stress: Not on file  Social Connections: Not on file  Intimate Partner Violence: Not on file   Family History  Problem Relation Age of Onset  . Hypertension Sister   . Anxiety disorder Sister   . Hypertension Brother   . Hypertension Brother   . Hypertension Brother   . Dementia Mother   . Paranoid behavior Mother   . Dementia Father   . Alcohol abuse Paternal Uncle   . Drug abuse Paternal Uncle   . ADD / ADHD Neg Hx   . Depression Neg Hx   . OCD Neg Hx   . Schizophrenia Neg Hx   . Seizures Neg Hx   . Sexual abuse Neg Hx   . Physical abuse Neg Hx   . Colon cancer Neg Hx      ASSESSMENT & PLAN:  1. Acute bronchitis, unspecified organism   2. Cough   3. Wheezing     Meds ordered this encounter  Medications  . predniSONE (STERAPRED UNI-PAK 21 TAB) 10 MG (21) TBPK tablet    Sig: Take by mouth daily for 6 days. Take 6 tablets on day 1, 5 tablets on day 2, 4 tablets on day 3, 3 tablets on day 4, 2 tablets on day 5, 1 tablet on day 6    Dispense:  21 tablet    Refill:  0    Order Specific Question:   Supervising Provider    Answer:   Chase Picket [id number]  . promethazine-dextromethorphan (PROMETHAZINE-DM) 6.25-15 MG/5ML syrup    Sig: Take 5 mLs by mouth 4 (four) times daily as needed for cough.    Dispense:  118 mL    Refill:  0    Order Specific Question:   Supervising Provider    Answer:   Chase Picket [id number]   Chest xray negative  for pneumonia Prescribed prednisone taper Prescribed promethazine syrup Will treat with above therapy given duration of symptoms.  Cough medication sedation precautions.  OTC symptom care as needed. Will plan f/u with PCP or here as needed.  Reviewed expectations re: course of current medical issues. Questions answered. Outlined signs and symptoms indicating need for more acute intervention. Patient verbalized understanding. After Visit Summary given.           Faustino Congress, NP 10/01/20 1456    Faustino Congress, NP 10/01/20 1456

## 2020-10-02 ENCOUNTER — Ambulatory Visit: Payer: Self-pay

## 2020-10-19 DIAGNOSIS — K219 Gastro-esophageal reflux disease without esophagitis: Secondary | ICD-10-CM | POA: Diagnosis not present

## 2020-10-19 DIAGNOSIS — Z6830 Body mass index (BMI) 30.0-30.9, adult: Secondary | ICD-10-CM | POA: Diagnosis not present

## 2020-10-19 DIAGNOSIS — I10 Essential (primary) hypertension: Secondary | ICD-10-CM | POA: Diagnosis not present

## 2020-10-19 DIAGNOSIS — Z79899 Other long term (current) drug therapy: Secondary | ICD-10-CM | POA: Diagnosis not present

## 2020-10-19 DIAGNOSIS — G8929 Other chronic pain: Secondary | ICD-10-CM | POA: Diagnosis not present

## 2020-10-19 DIAGNOSIS — Z299 Encounter for prophylactic measures, unspecified: Secondary | ICD-10-CM | POA: Diagnosis not present

## 2020-10-19 DIAGNOSIS — Z87891 Personal history of nicotine dependence: Secondary | ICD-10-CM | POA: Diagnosis not present

## 2020-10-24 DIAGNOSIS — I10 Essential (primary) hypertension: Secondary | ICD-10-CM | POA: Diagnosis not present

## 2020-10-24 DIAGNOSIS — E7800 Pure hypercholesterolemia, unspecified: Secondary | ICD-10-CM | POA: Diagnosis not present

## 2020-11-07 DIAGNOSIS — Z87891 Personal history of nicotine dependence: Secondary | ICD-10-CM | POA: Diagnosis not present

## 2020-11-07 DIAGNOSIS — Z299 Encounter for prophylactic measures, unspecified: Secondary | ICD-10-CM | POA: Diagnosis not present

## 2020-11-07 DIAGNOSIS — U071 COVID-19: Secondary | ICD-10-CM | POA: Diagnosis not present

## 2020-11-17 DIAGNOSIS — Z87891 Personal history of nicotine dependence: Secondary | ICD-10-CM | POA: Diagnosis not present

## 2020-11-17 DIAGNOSIS — M549 Dorsalgia, unspecified: Secondary | ICD-10-CM | POA: Diagnosis not present

## 2020-11-17 DIAGNOSIS — Z299 Encounter for prophylactic measures, unspecified: Secondary | ICD-10-CM | POA: Diagnosis not present

## 2020-11-17 DIAGNOSIS — Z6830 Body mass index (BMI) 30.0-30.9, adult: Secondary | ICD-10-CM | POA: Diagnosis not present

## 2020-11-17 DIAGNOSIS — G8929 Other chronic pain: Secondary | ICD-10-CM | POA: Diagnosis not present

## 2020-12-15 DIAGNOSIS — Z79899 Other long term (current) drug therapy: Secondary | ICD-10-CM | POA: Diagnosis not present

## 2020-12-15 DIAGNOSIS — M549 Dorsalgia, unspecified: Secondary | ICD-10-CM | POA: Diagnosis not present

## 2020-12-15 DIAGNOSIS — G8929 Other chronic pain: Secondary | ICD-10-CM | POA: Diagnosis not present

## 2020-12-15 DIAGNOSIS — Z299 Encounter for prophylactic measures, unspecified: Secondary | ICD-10-CM | POA: Diagnosis not present

## 2020-12-15 DIAGNOSIS — F1120 Opioid dependence, uncomplicated: Secondary | ICD-10-CM | POA: Diagnosis not present

## 2020-12-15 DIAGNOSIS — I10 Essential (primary) hypertension: Secondary | ICD-10-CM | POA: Diagnosis not present

## 2020-12-21 DIAGNOSIS — E7800 Pure hypercholesterolemia, unspecified: Secondary | ICD-10-CM | POA: Diagnosis not present

## 2020-12-21 DIAGNOSIS — E1165 Type 2 diabetes mellitus with hyperglycemia: Secondary | ICD-10-CM | POA: Diagnosis not present

## 2020-12-27 ENCOUNTER — Telehealth (HOSPITAL_COMMUNITY): Payer: Self-pay | Admitting: *Deleted

## 2020-12-27 NOTE — Telephone Encounter (Signed)
I can approve it

## 2020-12-27 NOTE — Telephone Encounter (Signed)
Patient pharmacy called and the pharmacist she will feel comfortable if a new script can be sent to the pharmacy. Per pt pharmacist at [facility], patient last script was filled on the 14 th of March and its 2 weeks early. She said she will feel comfortable if provider can send in a new script with start date on it in the pharmacy.

## 2020-12-27 NOTE — Telephone Encounter (Signed)
Patient calling stating he would like to know if he could pick up his medication for his Xanax early due to him and his daughter leaving for [facility] on April 7th. Per pt the pharmacy that the 10 th is the earliest he could pick up his script usless the provider agrees that patient can pick up his Xanax earlier then that. Spoke with Lysbeth Galas at the pharmacy and they stated that the earliest they can fill the Xanax is April 10 th unless that doctor approves it to be filled earlier. [phone]

## 2020-12-29 ENCOUNTER — Other Ambulatory Visit (HOSPITAL_COMMUNITY): Payer: Self-pay | Admitting: Psychiatry

## 2020-12-29 DIAGNOSIS — F321 Major depressive disorder, single episode, moderate: Secondary | ICD-10-CM

## 2020-12-29 MED ORDER — ALPRAZOLAM 0.5 MG PO TABS
ORAL_TABLET | ORAL | 3 refills | Status: DC
Start: 2020-12-29 — End: 2020-12-29

## 2020-12-29 MED ORDER — ALPRAZOLAM 0.5 MG PO TABS
ORAL_TABLET | ORAL | 3 refills | Status: DC
Start: 2020-12-29 — End: 2021-03-01

## 2020-12-29 NOTE — Telephone Encounter (Signed)
sent 

## 2020-12-29 NOTE — Telephone Encounter (Signed)
Pharmacy calling back wanting provider to write in the pharmacy note section saying   "this is an early fill due to patient going on vacation"  Pharmacist wants it for Audit reasons. They said the date is fine but they need in the notes as stated above a well.

## 2021-01-19 DIAGNOSIS — F1120 Opioid dependence, uncomplicated: Secondary | ICD-10-CM | POA: Diagnosis not present

## 2021-01-19 DIAGNOSIS — G8929 Other chronic pain: Secondary | ICD-10-CM | POA: Diagnosis not present

## 2021-01-19 DIAGNOSIS — Z6830 Body mass index (BMI) 30.0-30.9, adult: Secondary | ICD-10-CM | POA: Diagnosis not present

## 2021-01-19 DIAGNOSIS — Z299 Encounter for prophylactic measures, unspecified: Secondary | ICD-10-CM | POA: Diagnosis not present

## 2021-01-19 DIAGNOSIS — I10 Essential (primary) hypertension: Secondary | ICD-10-CM | POA: Diagnosis not present

## 2021-01-20 ENCOUNTER — Encounter (HOSPITAL_COMMUNITY): Payer: Self-pay | Admitting: Psychiatry

## 2021-01-20 ENCOUNTER — Other Ambulatory Visit: Payer: Self-pay

## 2021-01-20 ENCOUNTER — Telehealth (INDEPENDENT_AMBULATORY_CARE_PROVIDER_SITE_OTHER): Payer: PPO | Admitting: Psychiatry

## 2021-01-20 DIAGNOSIS — F321 Major depressive disorder, single episode, moderate: Secondary | ICD-10-CM

## 2021-01-20 MED ORDER — FLUOXETINE HCL 40 MG PO CAPS: 40.0000 mg | ORAL_CAPSULE | Freq: Every day | ORAL | 3 refills | Status: DC

## 2021-01-20 MED ORDER — LAMOTRIGINE 100 MG PO TABS: 100.0000 mg | ORAL_TABLET | Freq: Two times a day (BID) | ORAL | 3 refills | Status: DC

## 2021-01-20 NOTE — Progress Notes (Signed)
Virtual Visit via Telephone Note  I connected with Stephen Porter on 01/20/21 at 10:00 AM EDT by telephone and verified that I am speaking with the correct person using two identifiers.  Location: Patient: home Provider: home office   I discussed the limitations, risks, security and privacy concerns of performing an evaluation and management service by telephone and the availability of in person appointments. I also discussed with the patient that there may be a patient responsible charge related to this service. The patient expressed understanding and agreed to proceed.    I discussed the assessment and treatment plan with the patient. The patient was provided an opportunity to ask questions and all were answered. The patient agreed with the plan and demonstrated an understanding of the instructions.   The patient was advised to call back or seek an in-person evaluation if the symptoms worsen or if the condition fails to improve as anticipated.  I provided 15 minutes of non-face-to-face time during this encounter.   Levonne Spiller, MD  [facility] MD/PA/NP OP Progress Note  01/20/2021 10:22 AM Stephen Porter  MRN:  [id number]  Chief Complaint:  Chief Complaint    Anxiety; Depression; Follow-up     HPI: This patient is a 65 year old married white male who lives with his wife in [place]. He has 2 grown daughters one son and 3 grandchildren. 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  The patient was referred by his primary physician, Dr. Brigitte Pulse, for further assessment and treatment of depression anxiety and chronic pain.  The patient states that as a younger person he never had any issues with depression. 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 In 2003 he had a neck injury and has had 3 surgeries on it and in 2005 had a back injury and had to have surgery on  this as well. He stated that after the back surgery he was in a great deal of pain and dysfunction. He was definitely no longer able to work and he was very frustrated with this. He stated that the back surgeon gave him a high dosage of pain medication and he became addicted. He had to go to [facility] for detox and he claimed he was detoxed without any help with medication. A few months later he had to get on fentanyl patches for the pain.  The patient returns for follow-up after 4 months.  He states he has not been doing as well lately.  He has been feeling more depressed.  He is having significant pain in his back.  He is on fentanyl patches but when he changes them every 3 days he cannot sleep at night and this seems to be wearing him down.  I urged him to talk to his primary doctor about referral to pain management.  He states that he feels sad a good deal of the time and discouraged that he cannot do the things he used to do.  I suggested that we go up on his Prozac and he is in agreement.  He denies suicidal ideation Visit Diagnosis:    ICD-10-CM   1. Current moderate episode of major depressive disorder without prior episode ([place])  F32.1     Past Psychiatric History: Prior hospitalization at [facility] in and at the behavioral health hospital  Past Medical History:  Past Medical History:  Diagnosis Date  .  Bipolar disorder ([place])   . DDD (degenerative disc disease)   . Depression   . Hyperlipidemia   . Insomnia   . Jejunal ulcer   . Leukocytosis 04/12/2016  . Skin rash     Past Surgical History:  Procedure Laterality Date  . BACK SURGERY    . CERVICAL SPINE SURGERY     x3  . CHOLECYSTECTOMY    . COLONOSCOPY  2006  . COLONOSCOPY N/A 06/30/2014   Procedure: COLONOSCOPY;  Surgeon: Rogene Houston, MD;  Location: [facility];  Service: Endoscopy;  Laterality: N/A;  830  . LUMBAR SPINE SURGERY    . UPPER GASTROINTESTINAL ENDOSCOPY      Family Psychiatric History: see  below  Family History:  Family History  Problem Relation Age of Onset  . Hypertension Sister   . Anxiety disorder Sister   . Hypertension Brother   . Hypertension Brother   . Hypertension Brother   . Dementia Mother   . Paranoid behavior Mother   . Dementia Father   . Alcohol abuse Paternal Uncle   . Drug abuse Paternal Uncle   . ADD / ADHD Neg Hx   . Depression Neg Hx   . OCD Neg Hx   . Schizophrenia Neg Hx   . Seizures Neg Hx   . Sexual abuse Neg Hx   . Physical abuse Neg Hx   . Colon cancer Neg Hx     Social History:  Social History   Socioeconomic History  . Marital status: Married    Spouse name: Not on file  . Number of children: Not on file  . Years of education: Not on file  . Highest education level: Not on file  Occupational History  . Occupation: Scientist, research (life sciences): UNEMPLOYED  Tobacco Use  . Smoking status: Former Smoker    Packs/day: 1.00    Years: 40.00    Pack years: 40.00    Types: Cigarettes  . Smokeless tobacco: Current User  . Tobacco comment: 07-05-2016 Dip, 01-22-2017 per pt he stopped Sep 25, 2016  Substance and Sexual Activity  . Alcohol use: No    Comment: 07-05-2016 per pt no  . Drug use: No    Comment: 07-05-2016 per pt no and he stopped Marijuana 1 yr ago  . Sexual activity: Not on file  Other Topics Concern  . Not on file  Social History Narrative   Married with 3 children and lives locally   No regular exercise   Social Determinants of Health   Financial Resource Strain: Not on file  Food Insecurity: Not on file  Transportation Needs: Not on file  Physical Activity: Not on file  Stress: Not on file  Social Connections: Not on file    Allergies:  Allergies  Allergen Reactions  . Elavil [Amitriptyline] Other (See Comments)    Shaky    Metabolic Disorder Labs: Lab Results  Component Value Date   HGBA1C 5.4 12/10/2018   MPG 108.28 12/10/2018   No results found for: PROLACTIN Lab Results  Component Value Date    CHOL 212 (H) 12/10/2018   TRIG 347 (H) 12/10/2018   HDL 34 (L) 12/10/2018   CHOLHDL 6.2 12/10/2018   VLDL 69 (H) 12/10/2018   LDLCALC 109 (H) 12/10/2018   LDLCALC See Comment mg/dL 07/04/2010   Lab Results  Component Value Date   TSH 0.959 02/23/2012   TSH 1.237 07/04/2010    Therapeutic Level Labs: No results found for: LITHIUM No results  found for: VALPROATE No components found for:  CBMZ  Current Medications: Current Outpatient Medications  Medication Sig Dispense Refill  . FLUoxetine (PROZAC) 40 MG capsule Take 1 capsule (40 mg total) by mouth daily. 30 capsule 3  . ALPRAZolam (XANAX) 0.5 MG tablet TAKE 1 TABLET BY MOUTH EVERY MORNING, AT NOON, EVERY EVENING and AT BEDTIME 120 tablet 3  . amLODipine (NORVASC) 5 MG tablet Take 5 mg by mouth at bedtime.     Marland Kitchen aspirin EC 325 MG tablet Take 1 tablet (325 mg total) by mouth daily. 30 tablet 3  . baclofen (LIORESAL) 10 MG tablet Take 10 mg by mouth daily.    Marland Kitchen ezetimibe (ZETIA) 10 MG tablet Take 1 tablet (10 mg total) by mouth daily. 30 tablet 3  . fentaNYL (DURAGESIC - DOSED MCG/HR) 50 MCG/HR Apply 50 mcg/hr topically every 3 (three) days.    Marland Kitchen lamoTRIgine (LAMICTAL) 100 MG tablet Take 1 tablet (100 mg total) by mouth 2 (two) times daily. 60 tablet 3  . nicotine (NICODERM CQ - DOSED IN MG/24 HOURS) 14 mg/24hr patch Place 1 patch (14 mg total) onto the skin daily. 28 patch 0  . omega-3 acid ethyl esters (LOVAZA) 1 g capsule Take 1 capsule (1 g total) by mouth 2 (two) times daily. 60 capsule 1  . promethazine-dextromethorphan (PROMETHAZINE-DM) 6.25-15 MG/5ML syrup Take 5 mLs by mouth 4 (four) times daily as needed for cough. 118 mL 0   No current facility-administered medications for this visit.     Musculoskeletal: Strength & Muscle Tone: decreased Gait & Station: normal Patient leans: N/A  Psychiatric Specialty Exam: Review of Systems  Constitutional: Positive for fatigue.  Musculoskeletal: Positive for arthralgias, back  pain and myalgias.  Psychiatric/Behavioral: Positive for dysphoric mood.  All other systems reviewed and are negative.   There were no vitals taken for this visit.There is no height or weight on file to calculate BMI.  General Appearance: NA  Eye Contact:  NA  Speech:  Clear and Coherent  Volume:  Normal  Mood:  dysphoric  Affect:  NA  Thought Process:  Goal Directed  Orientation:  Full (Time, Place, and Person)  Thought Content: Rumination   Suicidal Thoughts:  No  Homicidal Thoughts:  No  Memory:  Immediate;   Good Recent;   Good Remote;   Good  Judgement:  Good  Insight:  Fair  Psychomotor Activity:  Decreased  Concentration:  Concentration: Good and Attention Span: Good  Recall:  Good  Fund of Knowledge: Good  Language: Good  Akathisia:  No  Handed:  Right  AIMS (if indicated): not done  Assets:  Communication Skills Desire for Improvement Resilience Social Support Talents/Skills  ADL's:  Intact  Cognition: WNL  Sleep:  Fair   Screenings: PHQ2-9   Flowsheet Row Video Visit from 01/20/2021 in [facility]  PHQ-2 Total Score 2  PHQ-9 Total Score 8    Flowsheet Row Video Visit from 01/20/2021 in [facility] No Risk       Assessment and Plan: This patient is a 65 year old male with a history of depression and anxiety.  He feels like his depression has been worsening.  He will therefore increase Prozac to 40 mg daily for depression.  He will continue Lamictal 50 mg in the morning and 100 mg in the evening for mood stabilization and Xanax 0.5 mg 4 times daily for anxiety.  He will return to see me in  6 weeks or call sooner as needed   Levonne Spiller, MD 01/20/2021, 10:22 AM

## 2021-02-17 DIAGNOSIS — M549 Dorsalgia, unspecified: Secondary | ICD-10-CM | POA: Diagnosis not present

## 2021-02-17 DIAGNOSIS — Z6830 Body mass index (BMI) 30.0-30.9, adult: Secondary | ICD-10-CM | POA: Diagnosis not present

## 2021-02-17 DIAGNOSIS — Z299 Encounter for prophylactic measures, unspecified: Secondary | ICD-10-CM | POA: Diagnosis not present

## 2021-02-17 DIAGNOSIS — G8929 Other chronic pain: Secondary | ICD-10-CM | POA: Diagnosis not present

## 2021-02-17 DIAGNOSIS — I10 Essential (primary) hypertension: Secondary | ICD-10-CM | POA: Diagnosis not present

## 2021-02-26 ENCOUNTER — Inpatient Hospital Stay (HOSPITAL_COMMUNITY)
Admission: EM | Admit: 2021-02-26 | Discharge: 2021-03-01 | DRG: 896 | Disposition: A | Discharge status: Home / Self Care | Payer: PPO | Attending: Internal Medicine | Admitting: Internal Medicine

## 2021-02-26 ENCOUNTER — Encounter (HOSPITAL_COMMUNITY): Payer: Self-pay | Admitting: *Deleted

## 2021-02-26 ENCOUNTER — Emergency Department (HOSPITAL_COMMUNITY): Payer: PPO

## 2021-02-26 ENCOUNTER — Other Ambulatory Visit: Payer: Self-pay

## 2021-02-26 DIAGNOSIS — F1120 Opioid dependence, uncomplicated: Secondary | ICD-10-CM | POA: Diagnosis not present

## 2021-02-26 DIAGNOSIS — I444 Left anterior fascicular block: Secondary | ICD-10-CM | POA: Diagnosis present

## 2021-02-26 DIAGNOSIS — Z20822 Contact with and (suspected) exposure to covid-19: Secondary | ICD-10-CM | POA: Diagnosis present

## 2021-02-26 DIAGNOSIS — R4182 Altered mental status, unspecified: Secondary | ICD-10-CM

## 2021-02-26 DIAGNOSIS — G4700 Insomnia, unspecified: Secondary | ICD-10-CM | POA: Diagnosis not present

## 2021-02-26 DIAGNOSIS — Z8249 Family history of ischemic heart disease and other diseases of the circulatory system: Secondary | ICD-10-CM

## 2021-02-26 DIAGNOSIS — I959 Hypotension, unspecified: Secondary | ICD-10-CM | POA: Diagnosis present

## 2021-02-26 DIAGNOSIS — M5136 Other intervertebral disc degeneration, lumbar region: Secondary | ICD-10-CM | POA: Diagnosis not present

## 2021-02-26 DIAGNOSIS — G928 Other toxic encephalopathy: Secondary | ICD-10-CM | POA: Diagnosis not present

## 2021-02-26 DIAGNOSIS — R29898 Other symptoms and signs involving the musculoskeletal system: Secondary | ICD-10-CM

## 2021-02-26 DIAGNOSIS — R9431 Abnormal electrocardiogram [ECG] [EKG]: Secondary | ICD-10-CM | POA: Diagnosis present

## 2021-02-26 DIAGNOSIS — F419 Anxiety disorder, unspecified: Secondary | ICD-10-CM | POA: Diagnosis present

## 2021-02-26 DIAGNOSIS — G929 Unspecified toxic encephalopathy: Secondary | ICD-10-CM | POA: Diagnosis present

## 2021-02-26 DIAGNOSIS — E876 Hypokalemia: Secondary | ICD-10-CM | POA: Diagnosis not present

## 2021-02-26 DIAGNOSIS — E041 Nontoxic single thyroid nodule: Secondary | ICD-10-CM

## 2021-02-26 DIAGNOSIS — T424X6A Underdosing of benzodiazepines, initial encounter: Secondary | ICD-10-CM | POA: Diagnosis not present

## 2021-02-26 DIAGNOSIS — F319 Bipolar disorder, unspecified: Secondary | ICD-10-CM | POA: Diagnosis not present

## 2021-02-26 DIAGNOSIS — R42 Dizziness and giddiness: Secondary | ICD-10-CM | POA: Diagnosis present

## 2021-02-26 DIAGNOSIS — H532 Diplopia: Secondary | ICD-10-CM | POA: Diagnosis present

## 2021-02-26 DIAGNOSIS — Z87891 Personal history of nicotine dependence: Secondary | ICD-10-CM

## 2021-02-26 DIAGNOSIS — R531 Weakness: Secondary | ICD-10-CM | POA: Diagnosis not present

## 2021-02-26 DIAGNOSIS — Z981 Arthrodesis status: Secondary | ICD-10-CM

## 2021-02-26 DIAGNOSIS — M5126 Other intervertebral disc displacement, lumbar region: Secondary | ICD-10-CM | POA: Diagnosis not present

## 2021-02-26 DIAGNOSIS — K5090 Crohn's disease, unspecified, without complications: Secondary | ICD-10-CM | POA: Diagnosis not present

## 2021-02-26 DIAGNOSIS — E785 Hyperlipidemia, unspecified: Secondary | ICD-10-CM

## 2021-02-26 DIAGNOSIS — G8929 Other chronic pain: Secondary | ICD-10-CM | POA: Diagnosis not present

## 2021-02-26 DIAGNOSIS — F13231 Sedative, hypnotic or anxiolytic dependence with withdrawal delirium: Secondary | ICD-10-CM | POA: Diagnosis not present

## 2021-02-26 DIAGNOSIS — M47816 Spondylosis without myelopathy or radiculopathy, lumbar region: Secondary | ICD-10-CM | POA: Diagnosis present

## 2021-02-26 DIAGNOSIS — Z91138 Patient's unintentional underdosing of medication regimen for other reason: Secondary | ICD-10-CM

## 2021-02-26 DIAGNOSIS — Z8711 Personal history of peptic ulcer disease: Secondary | ICD-10-CM

## 2021-02-26 DIAGNOSIS — Z8619 Personal history of other infectious and parasitic diseases: Secondary | ICD-10-CM

## 2021-02-26 DIAGNOSIS — M50323 Other cervical disc degeneration at C6-C7 level: Secondary | ICD-10-CM | POA: Diagnosis not present

## 2021-02-26 DIAGNOSIS — F1011 Alcohol abuse, in remission: Secondary | ICD-10-CM | POA: Diagnosis not present

## 2021-02-26 DIAGNOSIS — R29818 Other symptoms and signs involving the nervous system: Secondary | ICD-10-CM | POA: Diagnosis not present

## 2021-02-26 DIAGNOSIS — Z818 Family history of other mental and behavioral disorders: Secondary | ICD-10-CM

## 2021-02-26 DIAGNOSIS — Z7982 Long term (current) use of aspirin: Secondary | ICD-10-CM

## 2021-02-26 DIAGNOSIS — R262 Difficulty in walking, not elsewhere classified: Secondary | ICD-10-CM | POA: Diagnosis not present

## 2021-02-26 DIAGNOSIS — M542 Cervicalgia: Secondary | ICD-10-CM | POA: Diagnosis not present

## 2021-02-26 DIAGNOSIS — W57XXXA Bitten or stung by nonvenomous insect and other nonvenomous arthropods, initial encounter: Secondary | ICD-10-CM | POA: Diagnosis present

## 2021-02-26 DIAGNOSIS — Z79899 Other long term (current) drug therapy: Secondary | ICD-10-CM

## 2021-02-26 DIAGNOSIS — I10 Essential (primary) hypertension: Secondary | ICD-10-CM

## 2021-02-26 DIAGNOSIS — Z8673 Personal history of transient ischemic attack (TIA), and cerebral infarction without residual deficits: Secondary | ICD-10-CM

## 2021-02-26 DIAGNOSIS — Z781 Physical restraint status: Secondary | ICD-10-CM

## 2021-02-26 DIAGNOSIS — M6281 Muscle weakness (generalized): Secondary | ICD-10-CM | POA: Diagnosis not present

## 2021-02-26 HISTORY — DX: Unspecified toxic encephalopathy: G92.9

## 2021-02-26 LAB — COMPREHENSIVE METABOLIC PANEL
ALT: 26 U/L (ref 0–44)
AST: 30 U/L (ref 15–41)
Albumin: 4.6 g/dL (ref 3.5–5.0)
Alkaline Phosphatase: 77 U/L (ref 38–126)
Anion gap: 11 (ref 5–15)
BUN: 10 mg/dL (ref 8–23)
CO2: 24 mmol/L (ref 22–32)
Calcium: 9.5 mg/dL (ref 8.9–10.3)
Chloride: 104 mmol/L (ref 98–111)
Creatinine, Ser: 0.78 mg/dL (ref 0.61–1.24)
GFR, Estimated: >60 mL/min (ref >60)
Glucose, Bld: 123 mg/dL — ABNORMAL HIGH (ref 70–99)
Potassium: 3.2 mmol/L — ABNORMAL LOW (ref 3.5–5.1)
Sodium: 139 mmol/L (ref 135–145)
Total Bilirubin: 1.1 mg/dL (ref 0.3–1.2)
Total Protein: 7.7 g/dL (ref 6.5–8.1)

## 2021-02-26 LAB — URINALYSIS, ROUTINE W REFLEX MICROSCOPIC
Bilirubin Urine: NEGATIVE
Glucose, UA: NEGATIVE mg/dL
Hgb urine dipstick: NEGATIVE
Ketones, ur: 20 mg/dL — AB
Leukocytes,Ua: NEGATIVE
Nitrite: NEGATIVE
Protein, ur: NEGATIVE mg/dL
Specific Gravity, Urine: 1.044 — ABNORMAL HIGH (ref 1.005–1.030)
pH: 7 (ref 5.0–8.0)

## 2021-02-26 LAB — MAGNESIUM: Magnesium: 2 mg/dL (ref 1.7–2.4)

## 2021-02-26 LAB — DIFFERENTIAL
Abs Immature Granulocytes: 0.02 10*3/uL (ref 0.00–0.07)
Basophils Absolute: 0 10*3/uL (ref 0.0–0.1)
Basophils Relative: 0 %
Eosinophils Absolute: 0 10*3/uL (ref 0.0–0.5)
Eosinophils Relative: 0 %
Immature Granulocytes: 0 %
Lymphocytes Relative: 18 %
Lymphs Abs: 1.7 10*3/uL (ref 0.7–4.0)
Monocytes Absolute: 0.6 10*3/uL (ref 0.1–1.0)
Monocytes Relative: 7 %
Neutro Abs: 7 10*3/uL (ref 1.7–7.7)
Neutrophils Relative %: 75 %

## 2021-02-26 LAB — CBC
HCT: 46.5 % (ref 39.0–52.0)
Hemoglobin: 15.5 g/dL (ref 13.0–17.0)
MCH: 28.8 pg (ref 26.0–34.0)
MCHC: 33.3 g/dL (ref 30.0–36.0)
MCV: 86.3 fL (ref 80.0–100.0)
Platelets: 273 10*3/uL (ref 150–400)
RBC: 5.39 MIL/uL (ref 4.22–5.81)
RDW: 13.4 % (ref 11.5–15.5)
WBC: 9.4 10*3/uL (ref 4.0–10.5)
nRBC: 0 % (ref 0.0–0.2)

## 2021-02-26 LAB — RAPID URINE DRUG SCREEN, HOSP PERFORMED
Amphetamines: NOT DETECTED
Barbiturates: NOT DETECTED
Benzodiazepines: NOT DETECTED
Cocaine: NOT DETECTED
Opiates: NOT DETECTED
Tetrahydrocannabinol: NOT DETECTED

## 2021-02-26 LAB — TSH: TSH: 1.082 u[IU]/mL (ref 0.350–4.500)

## 2021-02-26 LAB — PROTIME-INR
INR: 1 (ref 0.8–1.2)
Prothrombin Time: 13.5 seconds (ref 11.4–15.2)

## 2021-02-26 LAB — ETHANOL: Alcohol, Ethyl (B): <10 mg/dL (ref <10)

## 2021-02-26 LAB — PHOSPHORUS: Phosphorus: 3.2 mg/dL (ref 2.5–4.6)

## 2021-02-26 LAB — CREATININE, SERUM
Creatinine, Ser: 0.75 mg/dL (ref 0.61–1.24)
GFR, Estimated: >60 mL/min (ref >60)

## 2021-02-26 LAB — CBG MONITORING, ED: Glucose-Capillary: 130 mg/dL — ABNORMAL HIGH (ref 70–99)

## 2021-02-26 LAB — RESP PANEL BY RT-PCR (FLU A&B, COVID) ARPGX2
Influenza A by PCR: NEGATIVE
Influenza B by PCR: NEGATIVE
SARS Coronavirus 2 by RT PCR: NEGATIVE

## 2021-02-26 LAB — SEDIMENTATION RATE: Sed Rate: 15 mm/hr (ref 0–16)

## 2021-02-26 LAB — APTT: aPTT: 32 seconds (ref 24–36)

## 2021-02-26 LAB — C-REACTIVE PROTEIN: CRP: 0.6 mg/dL (ref <1.0)

## 2021-02-26 MED ORDER — CHLORDIAZEPOXIDE HCL 25 MG PO CAPS
25.0000 mg | ORAL_CAPSULE | Freq: Four times a day (QID) | ORAL | Status: AC
  Administered 2021-02-27 (×3): 25 mg via ORAL
  Filled 2021-02-26 (×2): qty 1

## 2021-02-26 MED ORDER — FENTANYL 50 MCG/HR TD PT72
1.0000 | MEDICATED_PATCH | TRANSDERMAL | Status: DC
  Administered 2021-02-27: 1 via TRANSDERMAL

## 2021-02-26 MED ORDER — POTASSIUM CHLORIDE 10 MEQ/100ML IV SOLN
10.0000 meq | INTRAVENOUS | Status: AC
Start: 2021-02-27 — End: 2021-02-27
  Administered 2021-02-27 (×6): 10 meq via INTRAVENOUS
  Filled 2021-02-26 (×6): qty 100

## 2021-02-26 MED ORDER — FENTANYL 50 MCG/HR TD PT72: 1.0000 | MEDICATED_PATCH | TRANSDERMAL | Status: DC

## 2021-02-26 MED ORDER — FOLIC ACID 1 MG PO TABS: 1.0000 mg | ORAL_TABLET | Freq: Every day | ORAL | Status: DC

## 2021-02-26 MED ORDER — ONDANSETRON HCL 4 MG/2ML IJ SOLN: 4.0000 mg | Freq: Four times a day (QID) | INTRAMUSCULAR | Status: DC | PRN

## 2021-02-26 MED ORDER — THIAMINE HCL 100 MG/ML IJ SOLN
100.0000 mg | Freq: Every day | INTRAMUSCULAR | Status: DC
  Administered 2021-02-26: 100 mg via INTRAVENOUS
  Filled 2021-02-26 (×2): qty 2

## 2021-02-26 MED ORDER — CHLORDIAZEPOXIDE HCL 25 MG PO CAPS
25.0000 mg | ORAL_CAPSULE | Freq: Three times a day (TID) | ORAL | Status: DC
  Administered 2021-02-27 – 2021-02-28 (×2): 25 mg via ORAL
  Filled 2021-02-26 (×3): qty 1

## 2021-02-26 MED ORDER — LOPERAMIDE HCL 2 MG PO CAPS
2.0000 mg | ORAL_CAPSULE | ORAL | Status: DC | PRN
Start: 2021-02-26 — End: 2021-03-01

## 2021-02-26 MED ORDER — NICOTINE 14 MG/24HR TD PT24
14.0000 mg | MEDICATED_PATCH | Freq: Every day | TRANSDERMAL | Status: DC
  Administered 2021-02-26 – 2021-02-27 (×2): 14 mg via TRANSDERMAL
  Filled 2021-02-26 (×4): qty 1

## 2021-02-26 MED ORDER — CHLORDIAZEPOXIDE HCL 25 MG PO CAPS: 25.0000 mg | ORAL_CAPSULE | Freq: Four times a day (QID) | ORAL | Status: DC

## 2021-02-26 MED ORDER — LORAZEPAM 2 MG/ML IJ SOLN
1.0000 mg | Freq: Once | INTRAMUSCULAR | Status: AC
  Administered 2021-02-26: 1 mg via INTRAVENOUS
  Filled 2021-02-26: qty 1

## 2021-02-26 MED ORDER — FLUOXETINE HCL 20 MG PO CAPS
40.0000 mg | ORAL_CAPSULE | Freq: Every day | ORAL | Status: DC
  Administered 2021-02-27 – 2021-03-01 (×3): 40 mg via ORAL
  Filled 2021-02-26 (×3): qty 2

## 2021-02-26 MED ORDER — LORAZEPAM 2 MG/ML IJ SOLN
1.0000 mg | INTRAMUSCULAR | Status: DC | PRN
  Administered 2021-02-26: 4 mg via INTRAVENOUS
  Filled 2021-02-26: qty 2

## 2021-02-26 MED ORDER — PANTOPRAZOLE SODIUM 40 MG PO TBEC: 40.0000 mg | DELAYED_RELEASE_TABLET | Freq: Every day | ORAL | Status: DC | PRN

## 2021-02-26 MED ORDER — ACETAMINOPHEN 650 MG RE SUPP: 650.0000 mg | Freq: Four times a day (QID) | RECTAL | Status: DC | PRN

## 2021-02-26 MED ORDER — SENNA 8.6 MG PO TABS
1.0000 | ORAL_TABLET | Freq: Every day | ORAL | Status: DC
  Administered 2021-02-26 – 2021-02-28 (×2): 8.6 mg via ORAL
  Filled 2021-02-26 (×2): qty 1

## 2021-02-26 MED ORDER — THIAMINE HCL 100 MG PO TABS
100.0000 mg | ORAL_TABLET | Freq: Every day | ORAL | Status: DC
  Administered 2021-02-27 – 2021-03-01 (×3): 100 mg via ORAL
  Filled 2021-02-26 (×3): qty 1

## 2021-02-26 MED ORDER — EZETIMIBE 10 MG PO TABS
10.0000 mg | ORAL_TABLET | Freq: Every day | ORAL | Status: DC
  Administered 2021-02-26 – 2021-03-01 (×3): 10 mg via ORAL
  Filled 2021-02-26 (×4): qty 1

## 2021-02-26 MED ORDER — CHLORDIAZEPOXIDE HCL 25 MG PO CAPS: 25.0000 mg | ORAL_CAPSULE | ORAL | Status: DC

## 2021-02-26 MED ORDER — LAMOTRIGINE 100 MG PO TABS
100.0000 mg | ORAL_TABLET | Freq: Two times a day (BID) | ORAL | Status: DC
  Administered 2021-02-26 – 2021-03-01 (×6): 100 mg via ORAL
  Filled 2021-02-26 (×7): qty 1

## 2021-02-26 MED ORDER — CHLORDIAZEPOXIDE HCL 25 MG PO CAPS
50.0000 mg | ORAL_CAPSULE | Freq: Once | ORAL | Status: AC
  Administered 2021-02-26: 50 mg via ORAL
  Filled 2021-02-26: qty 2

## 2021-02-26 MED ORDER — SODIUM CHLORIDE 0.9% FLUSH
3.0000 mL | Freq: Two times a day (BID) | INTRAVENOUS | Status: DC
  Administered 2021-02-27 – 2021-03-01 (×4): 3 mL via INTRAVENOUS

## 2021-02-26 MED ORDER — POLYETHYLENE GLYCOL 3350 17 G PO PACK: 17.0000 g | PACK | Freq: Every day | ORAL | Status: DC | PRN

## 2021-02-26 MED ORDER — CHLORDIAZEPOXIDE HCL 25 MG PO CAPS: 25.0000 mg | ORAL_CAPSULE | Freq: Every day | ORAL | Status: DC

## 2021-02-26 MED ORDER — OMEGA-3-ACID ETHYL ESTERS 1 G PO CAPS
1.0000 g | ORAL_CAPSULE | Freq: Every day | ORAL | Status: DC
  Administered 2021-02-26 – 2021-02-28 (×3): 1 g via ORAL
  Filled 2021-02-26 (×4): qty 1

## 2021-02-26 MED ORDER — ACETAMINOPHEN 325 MG PO TABS: 650.0000 mg | ORAL_TABLET | Freq: Four times a day (QID) | ORAL | Status: DC | PRN

## 2021-02-26 MED ORDER — LORAZEPAM 1 MG PO TABS
1.0000 mg | ORAL_TABLET | ORAL | Status: DC | PRN
  Administered 2021-02-27 (×2): 1 mg via ORAL
  Administered 2021-02-27: 2 mg via ORAL
  Filled 2021-02-26 (×2): qty 1
  Filled 2021-02-26: qty 2
  Filled 2021-02-26: qty 1

## 2021-02-26 MED ORDER — ENOXAPARIN SODIUM 40 MG/0.4ML IJ SOSY
40.0000 mg | PREFILLED_SYRINGE | INTRAMUSCULAR | Status: DC
  Administered 2021-02-26 – 2021-02-28 (×3): 40 mg via SUBCUTANEOUS
  Filled 2021-02-26 (×3): qty 0.4

## 2021-02-26 MED ORDER — IOHEXOL 350 MG/ML SOLN
125.0000 mL | Freq: Once | INTRAVENOUS | Status: AC | PRN
  Administered 2021-02-26: 100 mL via INTRAVENOUS

## 2021-02-26 MED ORDER — ONDANSETRON HCL 4 MG PO TABS: 4.0000 mg | ORAL_TABLET | Freq: Four times a day (QID) | ORAL | Status: DC | PRN

## 2021-02-26 MED ORDER — LORAZEPAM 2 MG/ML IJ SOLN
0.5000 mg | Freq: Once | INTRAMUSCULAR | Status: AC
  Administered 2021-02-26: 0.5 mg via INTRAVENOUS
  Filled 2021-02-26: qty 1

## 2021-02-26 MED ORDER — CHLORDIAZEPOXIDE HCL 25 MG PO CAPS: 50.0000 mg | ORAL_CAPSULE | Freq: Once | ORAL | Status: DC

## 2021-02-26 MED ORDER — AMLODIPINE BESYLATE 5 MG PO TABS
5.0000 mg | ORAL_TABLET | Freq: Every day | ORAL | Status: DC
  Administered 2021-02-27 – 2021-02-28 (×2): 5 mg via ORAL
  Filled 2021-02-26 (×2): qty 1

## 2021-02-26 MED ORDER — THIAMINE HCL 100 MG PO TABS: 100.0000 mg | ORAL_TABLET | Freq: Every day | ORAL | Status: DC

## 2021-02-26 MED ORDER — FOLIC ACID 1 MG PO TABS
1.0000 mg | ORAL_TABLET | Freq: Every day | ORAL | Status: DC
  Administered 2021-02-26 – 2021-03-01 (×4): 1 mg via ORAL
  Filled 2021-02-26 (×4): qty 1

## 2021-02-26 MED ORDER — ADULT MULTIVITAMIN W/MINERALS CH
1.0000 | ORAL_TABLET | Freq: Every day | ORAL | Status: DC
  Administered 2021-02-26 – 2021-03-01 (×4): 1 via ORAL
  Filled 2021-02-26 (×4): qty 1

## 2021-02-26 MED ORDER — SODIUM CHLORIDE 0.9 % IV BOLUS
1000.0000 mL | Freq: Once | INTRAVENOUS | Status: AC
  Administered 2021-02-26: 1000 mL via INTRAVENOUS

## 2021-02-26 MED ORDER — CHLORDIAZEPOXIDE HCL 25 MG PO CAPS: 25.0000 mg | ORAL_CAPSULE | Freq: Three times a day (TID) | ORAL | Status: DC

## 2021-02-26 MED ORDER — HYDROXYZINE HCL 25 MG PO TABS
25.0000 mg | ORAL_TABLET | Freq: Four times a day (QID) | ORAL | Status: DC | PRN
  Administered 2021-02-28 – 2021-03-01 (×3): 25 mg via ORAL
  Filled 2021-02-26 (×3): qty 1

## 2021-02-26 NOTE — ED Notes (Signed)
Pt returned from CT °

## 2021-02-26 NOTE — ED Notes (Signed)
EDP at bedside and determines LKW to be 1000 this morning. Code Stroke activated.

## 2021-02-26 NOTE — ED Notes (Signed)
ED Provider at bedside. 

## 2021-02-26 NOTE — Progress Notes (Signed)
CODE STROKE  CALL TIME  1201PM BEEPER  1203 START  1203 END   1010 SOC   1010 Epic   1010 RAD CALLED  1011

## 2021-02-26 NOTE — ED Notes (Signed)
Patient transported to MRI 

## 2021-02-26 NOTE — H&P (Addendum)
Date: 02/26/2021               Patient Name:  Stephen Porter MRN: [id number]  DOB: 03/10/1956 Age / Sex: 65 y.o., male   PCP: Monico Blitz, MD         Medical Service: Internal Medicine Teaching Service         Attending Physician: Dr. Aldine Contes, MD    First Contact: Dr. Allyson Sabal Pager: [phone]  Second Contact: Dr. Charleen Kirks Pager: [phone]       After Hours (After 5p/  First Contact Pager: [phone]  weekends / holidays): Second Contact Pager: [phone]   Chief Complaint: Tremors  History of Present Illness:   Stephen Porter is a 49 y.o. gentleman w/ PMHx bipolar I disorder, anxiety, TIA, Carotid Artery Stenosis, HLD, TUD, DDD and Crohn's Disease who presented to the ED due to tremulousness and weakness in his legs that caused him difficulty walking. His wife is at the bedside who assists in providing history. She states that he has been undergoing withdrawal from Xanax over the past 6 days or so and he agrees he feels as if he's currently in withdrawal. He has been prescribed Xanax 0.5mg  four times daily due to anxiety, although notes he's taken up to 10-12 tablets per day up until last week, and refilled his current prescription a few days early. He also uses fentanyl patches for chronic back pain (hx of multiple back surgeries for disc herniations). He changes his patch regularly every 72 hours and placed his current patch yesterday. He says he previously went through rehabilitation for oxycodone use disorder and benzodiazepine use disorder and remains interested in rehabilitation currently. He denies using oxycodone over the last several years or using an excess of opioids than prescribed. He has a history of AUD although has not had alcohol in ~30 years. He currently endorses feeling anxious and tremulous and has been experiencing visual hallucinations while in the ED of [facility]. He denies any recent changes to his medications and says he has followed up with  Behavioral Health. He says his mood has been "good" recently and denies SI or HI. His wife notes he was unable to walk well PTA although attributed this to his tremors. He denies any troubles with numbness or weakness in his legs at baseline. Notes that he has had ~4 tick bites over the past week although denies any rashes. Wife notes he's had lyme disease in the past.   Home Medications:  Current Meds  Medication Sig  . ALPRAZolam (XANAX) 0.5 MG tablet TAKE 1 TABLET BY MOUTH EVERY MORNING, AT NOON, EVERY EVENING and AT BEDTIME (Patient taking differently: Take 0.5 mg by mouth See admin instructions. Take 0.5 mg by mouth in the morning, at noontime, every evening, and at bedtime)  . amLODipine (NORVASC) 5 MG tablet Take 5 mg by mouth at bedtime.   . Cholecalciferol (VITAMIN D3) 10 MCG (400 UNIT) CAPS Take 400 Units by mouth at bedtime.  Marland Kitchen ezetimibe (ZETIA) 10 MG tablet Take 1 tablet (10 mg total) by mouth daily. (Patient taking differently: Take 10 mg by mouth at bedtime.)  . fentaNYL (DURAGESIC - DOSED MCG/HR) 50 MCG/HR Apply 50 mcg/hr topically every 3 (three) days.  Marland Kitchen FLUoxetine (PROZAC) 40 MG capsule Take 1 capsule (40 mg total) by mouth daily.  Marland Kitchen ibuprofen (ADVIL) 200 MG tablet Take 400-600 mg by mouth every 6 (six) hours as needed for mild pain or headache.  . lamoTRIgine (LAMICTAL)  100 MG tablet Take 1 tablet (100 mg total) by mouth 2 (two) times daily.  . nicotine polacrilex (NICORETTE) 4 MG gum Take 4 mg by mouth as needed (for any urge to smoke).  Marland Kitchen omega-3 acid ethyl esters (LOVAZA) 1 g capsule Take 1 capsule (1 g total) by mouth 2 (two) times daily. (Patient taking differently: Take 1 g by mouth at bedtime.)  . pantoprazole (PROTONIX) 40 MG tablet Take 40 mg by mouth daily as needed (for reflux).   Allergies: Allergies as of 02/26/2021 - Review Complete 02/26/2021  Allergen Reaction Noted  . Elavil [amitriptyline] Other (See Comments) 10/15/2012   Past Medical History:  Diagnosis  Date  . Bipolar disorder ([place])   . DDD (degenerative disc disease)   . Depression   . Hyperlipidemia   . Insomnia   . Jejunal ulcer   . Leukocytosis 04/12/2016  . Skin rash    Family History:  Family History  Problem Relation Age of Onset  . Hypertension Sister   . Anxiety disorder Sister   . Hypertension Brother   . Hypertension Brother   . Hypertension Brother   . Dementia Mother   . Paranoid behavior Mother   . Dementia Father   . Alcohol abuse Paternal Uncle   . Drug abuse Paternal Uncle   . ADD / ADHD Neg Hx   . Depression Neg Hx   . OCD Neg Hx   . Schizophrenia Neg Hx   . Seizures Neg Hx   . Sexual abuse Neg Hx   . Physical abuse Neg Hx   . Colon cancer Neg Hx    Social History:  Patient lives at home with his wife.  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  Has a history of AUD although stopped drinking ~30 years ago.  Has ~ 49 pack year smoking history. Switched from cigarettes to nicorette gun (100 pieces per 2 weeks) about 5 years ago.  Has a history of benzodiazepine use disorder.  Denies any other illicit or IVDU.    Review of Systems: A complete ROS was negative except as per HPI.   Physical Exam: Blood pressure 137/84, pulse 79, temperature 98 F (36.7 C), temperature source Oral, resp. rate 17, height 5\' 10"  (1.778 m), weight 99.8 kg, SpO2 94 %.  General: Patient appears stated age. He is restless. No acute distress.  Eyes: Sclera non-icteric. No conjunctival injection. PERRLA. No nystagmus.  HENT: No nasal discharge. No tongue lesions noted.  Respiratory: Lungs are CTA, bilaterally. No wheezes, rales, or rhonchi. Mild tachypnea present but normal work of breathing on room air.  Cardiovascular: Regular rate and rhythm. There is a 9-7/0 systolic murmur heard best over the left lower sternal border, less pronounced over the right upper chest. No lower extremity edema. Neurological: Awake,  alert, oriented to person and intermittently to situation but not time. No CN deficits. Patient is tremulous (diffusely) at rest and with activity.   MSK: Normal muscle bulk and strength (5/5 all four extremities). Increased muscle tone of upper extremities present.  Abdominal: Soft and non-tender to palpation. Bowel sounds intact. No rebound or guarding. Skin: There are 3 erythematous papular lesions with central punctum overlying patient's right upper hip region without excoriation. No other lesions or rashes noted.  Psych: Patient is hypervigilant with psychomotor agitation. He appears anxious and restless, fidgeting with nearby objects. Does have visual hallucinations. Thought process is tangential at times. Otherwise  pleasant and cooperative.   EKG: personally reviewed my interpretation is NSR at 86bpm with T waves in V1-V5 which are new as well as possible Q waves in I and aVL. LAFB also noted on previous EKG's. QTc interval is slightly prolonged ~440's. No ST segment depressions or elevations.   CT Head without Contrast: Unremarkable  CTA head w/ Perfusion: 74mm incidental thyroid nodule on the left. MR Brain without Contrast: Minimal chronic small vessel changes of the cerebral hemispheric white matter. No acute findings.  CT Cervical Spine without Contrast: Prior anterior fusion C5-C6 and C7-T1 with mild DDD changes at C6-C7 but no acute cervical spine abnormalities.  CT Lumbar Spine without Contrast: L4-L5 non-compressive disc bulge with facet osteoarthritis with L5-S1 distant posterior decompression, diskectomy and cage fusion. No other acute findings.   Assessment & Plan by Problem: Active Problems:   Toxic encephalopathy  # Polysubstance Use Disorder # Benzodiazepine Withdrawal  Patient endorses current benzodiazepine use disorder and tobacco use disorder and remote history of opiate use disorder and AUD. He had recently been overdosing on his benzodiazepine prescription (10-12  rather than 4 pills as prescribed daily) and last took Xanax ~6 days ago. He has been experiencing signs and symptoms of withdrawals since that time that acutely worsened tonight including: diffuse tremors, anxiety, agitation, and visual hallucinations. He denies recent abuse of fentanyl patches (q72 hours, last placed yesterday), and denies any other recent drug or alcohol use. Does not currently have a fentanyl patch on. UDS unremarkable (all negative).  - Gave loading dose Librium 50mg  x 1 - Start Librium 25mg  four times daily  - CIWA + Ativan q2 hrs  - Soft restraints placed on bilateral wrists and ankles due to persistent agitation  - Replace fentanyl patch  - Continue nicotine patch 29J daily  - Folic acid 1mg  daily  - Thiamine 100mg  PO daily  - Zofran 4mg  q6hrs PRN  - Regular diet for now  - PT eval and treat  - Blood cultures pending   # Acute Lower Extremity Weakness  Code stroke was called due to concerns for RLE weakness; however, patient's weakness resolved while he was in the CT scan. Head imaging did not show any acute neurological deficits despite chronic white matter disease. CT lumbar spine showed L4-L5 disc bulge with facet osteoarthritis although no compressive stenosis and previous hardware appears stable. LE weakness was likely due to acute withdrawal, although cannot rule out TIA especially with patient's history of the same and other CVD risk factors. - Neurology signed off for now  - Check Hgb A1c and lipid panel  - Check Mg and Phos  - Check TSH - Continue close monitoring   # Bipolar I Disorder  # Anxiety  Patient is prescribed Xanax 0.5mg  QID, fluoxetine 40mg  daily, and lamotrigine 100mg  BID at home. Denies recent changes in dosing (aside from Xanax as above). He does have mildly elevated QTc prolongation and LAFB (chronic).  - Continue home fluoxetine and Lamictal   # HTN Patient takes amlodipine 5mg  nightly at home.  - Continue home antihypertensive  -  Continue close monitoring   # HLD Patient is not on a statin. Takes Zetia 10mg  daily at home.  - Check lipid panel  - Continue home Zetia 10mg  daily for now   # Degenerative Disc Disease # Chronic Back Pain w/ Opioid Dependence  - Switched Ibuprofen to Tylenol 650mg  q6hrs PRN  - Continue fentanyl 71mcg/hr patch q72hrs starting today   # Hypokalemia  Renal  function within normal limits although K low at 3.2 on admission. Likely due to poor PO intake recently.  - Check magnesium  - Giving KCl IV 39mEq x 6 doses now  - Check morning renal function and electrolytes   # Incidental Thyroid Nodule  CTA neck showed an incidental 59mm left thyroid nodule. Patient has no known history of thyroid disease. TSH is within normal limits.  - Would benefit from inpatient vs. Outpatient thyroid ultrasound   Code Status: Full Code  Diet: Regular (for now - will need ongoing monitoring)  IVF: None  DVT PPx: Lovenox  Dispo: Admit patient to Observation with expected length of stay less than 2 midnights.  Signed: Jeralyn Bennett, MD 02/26/2021, 10:44 PM  Pager: [phone] After 5pm on weekdays and 1pm on weekends: On Call pager: [phone]

## 2021-02-26 NOTE — ED Notes (Signed)
Pt transported via stretcher back to CT for CT angio

## 2021-02-26 NOTE — Hospital Course (Addendum)
3 days of confusion and hallucination Complaining of right leg weakness Has been on xanax for years and has been off xanax for the past week.  Got 0.5 mg on arrival   He got really shaky and could not stand up this morning. He states that he has been shaky all week since he stopped taking xanax Did ok Friday night, but sunday morning he became disoriented. 10 days was the last time he took  He took 0.5 4x a day and he has been taking it for years.  He was on xanax for sleeping difficulties and anxiety  Drink ETOH? He takes fentanyl patches, every 3 days for back pain.  He did go to rehab for opioid use disorder.  He would routinely run short because he would take more Ever get some off the street or nonprescription? He take his medications at home regularly No history of seizures Double vision, hallucinations,  Nicotine patch and last chaged his fentanyl patch yesterday 120 every week or two He smoked for the majority of his life.  Last drink has been years ago (30 ish)

## 2021-02-26 NOTE — Progress Notes (Signed)
Spoke with patients RN [employer].  We got his brain MRI done but pt became more disoriented and saying things that did not make sense.  Became wild inside the bore.  ? If the meds they gave him made him hallucinate. Pt is a Scientist, research (life sciences).

## 2021-02-26 NOTE — ED Notes (Signed)
RN gave 4mg  ativan per CIWA order set. Pt began nonsensical speaking, trying to get out of bed, pt tried to grab RN's arm, tried to swing at RN, tried to grab his wife, yelling. MD Coe notified, verbal order for soft wrist restraints. Restraints applied to bilateral wrists

## 2021-02-26 NOTE — ED Provider Notes (Signed)
[place] EMERGENCY DEPARTMENT Provider Note   CSN: [id number] Arrival date & time: 02/26/21  1138  An emergency department physician performed an initial assessment on this suspected stroke patient at 1202.  History Chief Complaint  Patient presents with  . MRI transfer    Stephen Porter is a 65 y.o. male.  65 year old male with prior medical history as detailed below presents from [facility] for further evaluation.  Patient was transferred to [facility] specifically for MRI imaging.  Patient initially presented to [facility] earlier today.  Patient's primary complaint was acute onset of right leg weakness.  Patient also apparently with symptoms of delirium over the last 3 to 4 days.    Patient's was seen by tele-neuro at [facility].  He was not a candidate for tPA.  Further work-up including MRI of brain and L-spine was indicated.  Given the emergent nature of his symptoms he was transferred to [facility] for MRI imaging.  Patient with tremor, delirium/hallucinations, and reported history of recent benzodiazepine use as well.  Upon arrival to the ED at [facility] he appears to be comfortable.  He understands current plan of care.  He reports that his leg feels improved.  During the evaluation he does appear to have some hallucinations.  He reports seeing a "bunny" and a "chicken" in the exam room. His wife reports that this is not normal for him.  The history is provided by the patient, medical records and the spouse.  Illness Location:  Tremor, delirium, confusion, right leg weakness Severity:  Moderate Onset quality:  Gradual Duration:  4 days Timing:  Constant Progression:  Worsening Chronicity:  New      Past Medical History:  Diagnosis Date  . Bipolar disorder ([place])   . DDD (degenerative disc disease)   . Depression   . Hyperlipidemia   . Insomnia   . Jejunal ulcer   . Leukocytosis 04/12/2016  . Skin rash     Patient Active Problem List    Diagnosis Date Noted  . TIA (transient ischemic attack)   . Chronic pain syndrome   . Dyslipidemia   . Weakness of left lower extremity 12/09/2018  . Major depression in full remission ([place]) 07/05/2016  . Leukocytosis 04/12/2016  . Insomnia due to mental disorder 09/03/2012  . Psychoactive substance-induced organic mood disorder ([place]) 02/25/2012  . Other depression due to general medical condition 02/24/2012  . HYPERLIPIDEMIA 07/05/2010  . ANXIETY 02/08/2010  . Tobacco abuse 02/08/2010  . DEPRESSION 02/08/2010  . DEGENERATIVE DISC DISEASE 02/08/2010  . CROHN'S DISEASE, HX OF 02/08/2010    Past Surgical History:  Procedure Laterality Date  . BACK SURGERY    . CERVICAL SPINE SURGERY     x3  . CHOLECYSTECTOMY    . COLONOSCOPY  2006  . COLONOSCOPY N/A 06/30/2014   Procedure: COLONOSCOPY;  Surgeon: Rogene Houston, MD;  Location: [facility];  Service: Endoscopy;  Laterality: N/A;  830  . LUMBAR SPINE SURGERY    . UPPER GASTROINTESTINAL ENDOSCOPY         Family History  Problem Relation Age of Onset  . Hypertension Sister   . Anxiety disorder Sister   . Hypertension Brother   . Hypertension Brother   . Hypertension Brother   . Dementia Mother   . Paranoid behavior Mother   . Dementia Father   . Alcohol abuse Paternal Uncle   . Drug abuse Paternal Uncle   . ADD / ADHD Neg  Hx   . Depression Neg Hx   . OCD Neg Hx   . Schizophrenia Neg Hx   . Seizures Neg Hx   . Sexual abuse Neg Hx   . Physical abuse Neg Hx   . Colon cancer Neg Hx     Social History   Tobacco Use  . Smoking status: Former Smoker    Packs/day: 1.00    Years: 40.00    Pack years: 40.00    Types: Cigarettes  . Smokeless tobacco: Current User  . Tobacco comment: 07-05-2016 Dip, 01-22-2017 per pt he stopped Sep 25, 2016  Vaping Use  . Vaping Use: Never used  Substance Use Topics  . Alcohol use: No    Comment: 07-05-2016 per pt no  . Drug use: No    Comment: 07-05-2016 per pt no and he stopped  Marijuana 1 yr ago    Home Medications Prior to Admission medications   Medication Sig Start Date End Date Taking? Authorizing Provider  ALPRAZolam (XANAX) 0.5 MG tablet TAKE 1 TABLET BY MOUTH EVERY MORNING, AT NOON, EVERY EVENING and AT BEDTIME Patient taking differently: Take 0.5 mg by mouth See admin instructions. Take 0.5 mg by mouth in the morning, at noontime, every evening, and at bedtime 12/29/20  Yes Cloria Spring, MD  amLODipine (NORVASC) 5 MG tablet Take 5 mg by mouth at bedtime.    Yes [provider]  Cholecalciferol (VITAMIN D3) 10 MCG (400 UNIT) CAPS Take 400 Units by mouth at bedtime.   Yes [provider]  ezetimibe (ZETIA) 10 MG tablet Take 1 tablet (10 mg total) by mouth daily. Patient taking differently: Take 10 mg by mouth at bedtime. 12/10/18  Yes Barton Dubois, MD  fentaNYL (DURAGESIC - DOSED MCG/HR) 50 MCG/HR Apply 50 mcg/hr topically every 3 (three) days. 08/03/12  Yes [provider]  FLUoxetine (PROZAC) 40 MG capsule Take 1 capsule (40 mg total) by mouth daily. 01/20/21  Yes Cloria Spring, MD  ibuprofen (ADVIL) 200 MG tablet Take 400-600 mg by mouth every 6 (six) hours as needed for mild pain or headache.   Yes [provider]  lamoTRIgine (LAMICTAL) 100 MG tablet Take 1 tablet (100 mg total) by mouth 2 (two) times daily. 01/20/21  Yes Cloria Spring, MD  nicotine polacrilex (NICORETTE) 4 MG gum Take 4 mg by mouth as needed (for any urge to smoke).   Yes [provider]  omega-3 acid ethyl esters (LOVAZA) 1 g capsule Take 1 capsule (1 g total) by mouth 2 (two) times daily. Patient taking differently: Take 1 g by mouth at bedtime. 12/10/18  Yes Barton Dubois, MD  pantoprazole (PROTONIX) 40 MG tablet Take 40 mg by mouth daily as needed (for reflux).   Yes [provider]  aspirin EC 325 MG tablet Take 1 tablet (325 mg total) by mouth daily. Patient not taking: No sig reported 12/11/18   Barton Dubois, MD  baclofen  (LIORESAL) 10 MG tablet Take 10 mg by mouth daily. Patient not taking: Reported on 02/26/2021 06/11/16   [provider]  nicotine (NICODERM CQ - DOSED IN MG/24 HOURS) 14 mg/24hr patch Place 1 patch (14 mg total) onto the skin daily. Patient not taking: No sig reported 12/11/18   Barton Dubois, MD  promethazine-dextromethorphan (PROMETHAZINE-DM) 6.25-15 MG/5ML syrup Take 5 mLs by mouth 4 (four) times daily as needed for cough. Patient not taking: No sig reported 10/01/20   Faustino Congress, NP    Allergies  Elavil [amitriptyline]  Review of Systems   Review of Systems  All other systems reviewed and are negative.   Physical Exam Updated Vital Signs BP (!) 150/90   Pulse 77   Temp 98 F (36.7 C) (Oral)   Resp 19   Ht 5\' 10"  (1.778 m)   Wt 99.8 kg   SpO2 96%   BMI 31.57 kg/m   Physical Exam Vitals and nursing note reviewed.  Constitutional:      General: He is not in acute distress.    Appearance: Normal appearance. He is well-developed.  HENT:     Head: Normocephalic and atraumatic.  Eyes:     Conjunctiva/sclera: Conjunctivae normal.     Pupils: Pupils are equal, round, and reactive to light.  Cardiovascular:     Rate and Rhythm: Normal rate and regular rhythm.     Heart sounds: Normal heart sounds.  Pulmonary:     Effort: Pulmonary effort is normal. No respiratory distress.     Breath sounds: Normal breath sounds.  Abdominal:     General: There is no distension.     Palpations: Abdomen is soft.     Tenderness: There is no abdominal tenderness.  Musculoskeletal:        General: No deformity. Normal range of motion.     Cervical back: Normal range of motion and neck supple.  Skin:    General: Skin is warm and dry.  Neurological:     Mental Status: He is alert and oriented to person, place, and time.     Comments: Alert, Oriented to person, place, situation  Mild tremor in all 4 extremities  Normal Speech  LVO screen negative  4/5 strength to BUE  and BLE  Patient does report seeing "bunnies" and "chickens" in the exam room       ED Results / Procedures / Treatments   Labs (all labs ordered are listed, but only abnormal results are displayed) Labs Reviewed  COMPREHENSIVE METABOLIC PANEL - Abnormal; Notable for the following components:      Result Value   Potassium 3.2 (*)    Glucose, Bld 123 (*)    All other components within normal limits  URINALYSIS, ROUTINE W REFLEX MICROSCOPIC - Abnormal; Notable for the following components:   Specific Gravity, Urine 1.044 (*)    Ketones, ur 20 (*)    All other components within normal limits  CBG MONITORING, ED - Abnormal; Notable for the following components:   Glucose-Capillary 130 (*)    All other components within normal limits  RESP PANEL BY RT-PCR (FLU A&B, COVID) ARPGX2  CULTURE, BLOOD (ROUTINE X 2)  CULTURE, BLOOD (ROUTINE X 2)  ETHANOL  PROTIME-INR  APTT  CBC  DIFFERENTIAL  RAPID URINE DRUG SCREEN, HOSP PERFORMED  SEDIMENTATION RATE  C-REACTIVE PROTEIN    EKG EKG Interpretation  Date/Time:  Sunday February 26 2021 16:56:18 EDT Ventricular Rate:  81 PR Interval:  184 QRS Duration: 95 QT Interval:  507 QTC Calculation: 589 R Axis:   -75 Text Interpretation: Sinus rhythm Left anterior fascicular block Borderline low voltage, extremity leads Abnormal R-wave progression, late transition Abnrm T, consider ischemia, anterolateral lds Prolonged QT interval Confirmed by Dene Gentry [phone]) on 02/26/2021 4:58:23 PM   Radiology CT Cervical Spine Wo Contrast  Result Date: 02/26/2021 CLINICAL DATA:  Acute neck pain, prior cervical spine surgery EXAM: CT CERVICAL SPINE WITHOUT CONTRAST TECHNIQUE: Multidetector CT imaging of the cervical spine was performed without intravenous contrast. Multiplanar CT image reconstructions were  also generated. COMPARISON:  MR cervical spine 08/09/2020 FINDINGS: Alignment: Normal Skull base and vertebrae: Osseous mineralization normal. Slight  reversal of cervical lordosis. Prior anterior fusion of C5-C6, hardware intact. Vertebral body heights maintained. Disc space narrowing C6-C7. Prior fusion of C7-T1 with removal of hardware. No fracture, subluxation, or bone destruction. Visualized skull base intact. Soft tissues and spinal canal: Prevertebral soft tissues normal thickness. Atherosclerotic calcification of carotid and vertebral arteries bilaterally. Disc levels:  No specific abnormalities. Upper chest: Lung apices clear Other: N/A IMPRESSION: Prior anterior fusion C5-C6 and C7-T1. Mild degenerative disc disease changes at C6-C7. Slight reversal of cervical lordosis. No acute cervical spine abnormalities. Electronically Signed   By: Lavonia Dana M.D.   On: 02/26/2021 12:25   CT Lumbar Spine Wo Contrast  Result Date: 02/26/2021 CLINICAL DATA:  Low back pain. Previous surgery. Confusion. Intermittent fevers. EXAM: CT LUMBAR SPINE WITHOUT CONTRAST TECHNIQUE: Multidetector CT imaging of the lumbar spine was performed without intravenous contrast administration. Multiplanar CT image reconstructions were also generated. COMPARISON:  MRI 08/09/2020 FINDINGS: Segmentation: 5 lumbar type vertebral bodies as numbered previously. Alignment: No malalignment. Vertebrae: No fracture or primary bone lesion. Paraspinal and other soft tissues: Negative Disc levels: No significant finding from T11-12 through L3-4. L4-5: Mild bulging of the disc. Bilateral facet degeneration and hypertrophy. No apparent compressive stenosis. Findings could relate to low back pain. L5-S1: Previous discectomy and cage fusion. There is probably solid union. No finding to suggest ongoing motion at this level. Sufficient patency of the canal and foramina. IMPRESSION: No acute finding by CT. L4-5: Disc bulge. Facet osteoarthritis. No compressive stenosis. Findings could relate to low back pain. L5-S1: Distant posterior decompression, diskectomy and cage fusion. Likely solid union with  sufficient patency of the canal. Electronically Signed   By: Nelson Chimes M.D.   On: 02/26/2021 12:27   CT HEAD CODE STROKE WO CONTRAST  Result Date: 02/26/2021 CLINICAL DATA:  Code stroke. Acute presentation with mental status change. EXAM: CT HEAD WITHOUT CONTRAST TECHNIQUE: Contiguous axial images were obtained from the base of the skull through the vertex without intravenous contrast. COMPARISON:  12/09/2018 FINDINGS: Brain: Normal appearance without evidence of atrophy, old or acute infarction, mass lesion, hemorrhage, hydrocephalus or extra-axial collection. Vascular: There is atherosclerotic calcification of the major vessels at the base of the brain. Skull: Negative Sinuses/Orbits: Clear sinuses. Old medial wall blowout fracture right orbit. Other: None ASPECTS ([place] Stroke Program Early CT Score) - Ganglionic level infarction (caudate, lentiform nuclei, internal capsule, insula, M1-M3 cortex): 7 - Supraganglionic infarction (M4-M6 cortex): 3 Total score (0-10 with 10 being normal): 10 IMPRESSION: 1. Negative acute head CT.  Normal intracranial exam. 2. ASPECTS is 10. 3. These results were called by telephone at the time of interpretation on 02/26/2021 at 12:13 pm to provider [facility] , who verbally acknowledged these results. Electronically Signed   By: Nelson Chimes M.D.   On: 02/26/2021 12:14   CT ANGIO HEAD NECK W WO CM W PERF (CODE STROKE)  Addendum Date: 02/26/2021   ADDENDUM REPORT: 02/26/2021 16:54 ADDENDUM: 16 mm incidental thyroid nodule on the left. Recommend thyroid US (ref: J Am Coll Radiol. 2015 Feb;12(2): 143-50). Electronically Signed   By: Nelson Chimes M.D.   On: 02/26/2021 16:54   Result Date: 02/26/2021 CLINICAL DATA:  Acute mental status changes. EXAM: CT ANGIOGRAPHY HEAD AND NECK CT PERFUSION BRAIN TECHNIQUE: Multidetector CT imaging of the head and neck was performed using the standard protocol during bolus administration of intravenous  contrast. Multiplanar CT image  reconstructions and MIPs were obtained to evaluate the vascular anatomy. Carotid stenosis measurements (when applicable) are obtained utilizing NASCET criteria, using the distal internal carotid diameter as the denominator. Multiphase CT imaging of the brain was performed following IV bolus contrast injection. Subsequent parametric perfusion maps were calculated using RAPID software. CONTRAST:  153mL OMNIPAQUE IOHEXOL 350 MG/ML SOLN COMPARISON:  None. Head CT earlier same day FINDINGS: CTA NECK FINDINGS Aortic arch: Aortic atherosclerosis. Branching pattern is normal without origin stenosis. Right carotid system: Common carotid artery widely patent to the bifurcation. Soft and calcified plaque at the carotid bifurcation but no stenosis. Cervical ICA is widely patent. Left carotid system: Common carotid artery widely patent to the bifurcation. Soft and calcified plaque at the carotid bifurcation and ICA bulb but no stenosis. Cervical ICA widely patent. Vertebral arteries: Right vertebral artery origin widely patent. 30% stenosis of the left vertebral artery origin. Wide patency of both vertebral arteries beyond that. Skeleton: Previous cervical fusion procedures C4 through C7. No acute bone finding. Other neck: 16 mm nodule left lobe of the thyroid. Upper chest: Emphysema without focal or active process. Review of the MIP images confirms the above findings CTA HEAD FINDINGS Anterior circulation: Both internal carotid arteries are widely patent through the skull base and siphon regions. Ordinary siphon atherosclerotic calcification but no stenosis greater than 30% suspected. The anterior and middle cerebral vessels are patent. No large or medium vessel occlusion. No aneurysm or vascular malformation. Posterior circulation: Both vertebral arteries widely patent to the basilar. No basilar stenosis. Posterior circulation branch vessels widely patent. Venous sinuses: Patent and normal. Anatomic variants: None significant.  Review of the MIP images confirms the above findings CT Brain Perfusion Findings: ASPECTS: 10 CBF (<30%) Volume: 72mL Perfusion (Tmax>6.0s) volume: 50mL Mismatch Volume: 60mL Infarction Location:None IMPRESSION: Normal perfusion examination. No acute CTA finding. No intracranial large or medium vessel occlusion. Atherosclerotic disease at both carotid bifurcations but without stenosis or appreciable irregularity. Electronically Signed: By: Nelson Chimes M.D. On: 02/26/2021 13:05    Procedures Procedures   Medications Ordered in ED Medications  iohexol (OMNIPAQUE) 350 MG/ML injection 125 mL (100 mLs Intravenous Contrast Given 02/26/21 1243)  sodium chloride 0.9 % bolus 1,000 mL (0 mLs Intravenous Stopped 02/26/21 1335)  LORazepam (ATIVAN) injection 0.5 mg (0.5 mg Intravenous Given 02/26/21 1811)    ED Course  I have reviewed the triage vital signs and the nursing notes.  Pertinent labs & imaging results that were available during my care of the patient were reviewed by me and considered in my medical decision making (see chart for details).    MDM Rules/Calculators/A&P                          MDM  MSE complete  ARTICE BERGERSON was evaluated in Emergency Department on 02/26/2021 for the symptoms described in the history of present illness. He was evaluated in the context of the global COVID-19 pandemic, which necessitated consideration that the patient might be at risk for infection with the SARS-CoV-2 virus that causes COVID-19. Institutional protocols and algorithms that pertain to the evaluation of patients at risk for COVID-19 are in a state of rapid change based on information released by regulatory bodies including the CDC and federal and state organizations. These policies and algorithms were followed during the patient's care in the ED.   Patient presents as transfer from [facility].  He was sent to [facility]  for emergent MRI imaging after tele neuro evaluation. He was not a candidate for  TPA.   Patient initially presented to [facility] with reported delirium and hallucinations for the last 3 to 4 days.  Patient with longstanding history of Xanax use.  Reportedly patient has attempted to wean himself off of Xanax over the last 2 weeks. His last dose of xanax was reportedly a week ago.  Patient is noted to be tremulous in all 4 extremities.  He is reporting active hallucinations. His wife reports that his delirium has worsened over the last 3 days.  He does not appear to have significant weakness in his right lower extremity upon arrival to [facility] ED.  MRI brain is without acute abnormality.   MRI Lumbar spine delayed after the patient became agitated and was "seeing animals" in MRI. Additional Ativan ordered. Of note, the patient's right leg weakness noted at [facility] now has apparently resolved.   Patient's presentation here at [facility] is most consistent with likely benzodiazepine withdrawal.  Medicine service is aware of case and will evaluate for admission.     Final Clinical Impression(s) / ED Diagnoses Final diagnoses:  Altered mental status, unspecified altered mental status type  Weakness of extremity    Rx / DC Orders ED Discharge Orders    None       Valarie Merino, MD 02/26/21 2001

## 2021-02-26 NOTE — ED Triage Notes (Addendum)
Pt brought to ED by wife from home. Wife reports pt has been has been confused, difficulty walking, delusions, weakness since Saturday. Symptoms have been progressing since Saturday. Wife also reports neck surgery in Dec and pt has been having fevers intermittently since then. Pt clammy at time of triage. Pt also stopped taking his Xanax about 10 days ago and wife reports he was having withdrawal symptoms for a few days, but on Friday he was back to normal. LKW Friday. Pt is normally alert and oriented x 4, ambulatory with no assistance, carries conversation well. Pt appears weak, only able to stand with 2 nurse assist and reports his right leg is "turning in".

## 2021-02-26 NOTE — ED Notes (Signed)
Attempted report x 1, NA

## 2021-02-26 NOTE — ED Notes (Signed)
Code stroke paged at this time

## 2021-02-26 NOTE — ED Provider Notes (Signed)
[facility] EMERGENCY DEPARTMENT Provider Note   CSN: [id number] Arrival date & time: 02/26/21  1138  An emergency department physician performed an initial assessment on this suspected stroke patient at 1202.  History Chief Complaint  Patient presents with  . Altered Mental Status    Stephen Porter is a 66 y.o. male.  Patient presents chief complaint of episodes of confusion at home, low-grade fevers and sudden onset weakness of the right lower extremity.  Family states that he is been having episodes of fevers ever since his surgery several months ago.  They described as low-grade fevers that are intermittent.  They spoken to their doctors about this but it has just been under observation no additional testing has been done.  Wife states that the for the past week the fevers have been more persistent and nearly daily.  Patient also has been weaning himself off of Xanax over the course of the last 2 weeks and has been off Xanax for the last 10 days.  Wife states that he has been having episodes of confusion in the last 3 to 4 days at 1 time calling the police for activity he suspected across the street which the wife feels like was a delusion.  Patient also complains of chronic intermittent neck and back pain for several months unchanged today.  Patient and wife state that he woke up this morning feeling well, ambulating down to the kitchen and making coffee with normal ambulation.  Then around 11 AM he had weakness of the right lower extremity and was unable to walk.  This prompted their ER visit today.        Past Medical History:  Diagnosis Date  . Bipolar disorder ([place])   . DDD (degenerative disc disease)   . Depression   . Hyperlipidemia   . Insomnia   . Jejunal ulcer   . Leukocytosis 04/12/2016  . Skin rash     Patient Active Problem List   Diagnosis Date Noted  . TIA (transient ischemic attack)   . Chronic pain syndrome   . Dyslipidemia   . Weakness of left lower  extremity 12/09/2018  . Major depression in full remission ([place]) 07/05/2016  . Leukocytosis 04/12/2016  . Insomnia due to mental disorder 09/03/2012  . Psychoactive substance-induced organic mood disorder ([place]) 02/25/2012  . Other depression due to general medical condition 02/24/2012  . HYPERLIPIDEMIA 07/05/2010  . ANXIETY 02/08/2010  . Tobacco abuse 02/08/2010  . DEPRESSION 02/08/2010  . DEGENERATIVE DISC DISEASE 02/08/2010  . CROHN'S DISEASE, HX OF 02/08/2010    Past Surgical History:  Procedure Laterality Date  . BACK SURGERY    . CERVICAL SPINE SURGERY     x3  . CHOLECYSTECTOMY    . COLONOSCOPY  2006  . COLONOSCOPY N/A 06/30/2014   Procedure: COLONOSCOPY;  Surgeon: Rogene Houston, MD;  Location: [facility];  Service: Endoscopy;  Laterality: N/A;  830  . LUMBAR SPINE SURGERY    . UPPER GASTROINTESTINAL ENDOSCOPY         Family History  Problem Relation Age of Onset  . Hypertension Sister   . Anxiety disorder Sister   . Hypertension Brother   . Hypertension Brother   . Hypertension Brother   . Dementia Mother   . Paranoid behavior Mother   . Dementia Father   . Alcohol abuse Paternal Uncle   . Drug abuse Paternal Uncle   . ADD / ADHD Neg Hx   . Depression Neg Hx   .  OCD Neg Hx   . Schizophrenia Neg Hx   . Seizures Neg Hx   . Sexual abuse Neg Hx   . Physical abuse Neg Hx   . Colon cancer Neg Hx     Social History   Tobacco Use  . Smoking status: Former Smoker    Packs/day: 1.00    Years: 40.00    Pack years: 40.00    Types: Cigarettes  . Smokeless tobacco: Current User  . Tobacco comment: 07-05-2016 Dip, 01-22-2017 per pt he stopped Sep 25, 2016  Vaping Use  . Vaping Use: Never used  Substance Use Topics  . Alcohol use: No    Comment: 07-05-2016 per pt no  . Drug use: No    Comment: 07-05-2016 per pt no and he stopped Marijuana 1 yr ago    Home Medications Prior to Admission medications   Medication Sig Start Date End Date Taking? Authorizing  Provider  ALPRAZolam Duanne Moron) 0.5 MG tablet TAKE 1 TABLET BY MOUTH EVERY MORNING, AT NOON, EVERY EVENING and AT BEDTIME 12/29/20   Cloria Spring, MD  amLODipine (NORVASC) 5 MG tablet Take 5 mg by mouth at bedtime.     [provider]  aspirin EC 325 MG tablet Take 1 tablet (325 mg total) by mouth daily. 12/11/18   Barton Dubois, MD  baclofen (LIORESAL) 10 MG tablet Take 10 mg by mouth daily. 06/11/16   [provider]  ezetimibe (ZETIA) 10 MG tablet Take 1 tablet (10 mg total) by mouth daily. 12/10/18   Barton Dubois, MD  fentaNYL (DURAGESIC - DOSED MCG/HR) 50 MCG/HR Apply 50 mcg/hr topically every 3 (three) days. 08/03/12   [provider]  FLUoxetine (PROZAC) 40 MG capsule Take 1 capsule (40 mg total) by mouth daily. 01/20/21   Cloria Spring, MD  lamoTRIgine (LAMICTAL) 100 MG tablet Take 1 tablet (100 mg total) by mouth 2 (two) times daily. 01/20/21   Cloria Spring, MD  nicotine (NICODERM CQ - DOSED IN MG/24 HOURS) 14 mg/24hr patch Place 1 patch (14 mg total) onto the skin daily. 12/11/18   Barton Dubois, MD  omega-3 acid ethyl esters (LOVAZA) 1 g capsule Take 1 capsule (1 g total) by mouth 2 (two) times daily. 12/10/18   Barton Dubois, MD  promethazine-dextromethorphan (PROMETHAZINE-DM) 6.25-15 MG/5ML syrup Take 5 mLs by mouth 4 (four) times daily as needed for cough. 10/01/20   Faustino Congress, NP    Allergies    Elavil [amitriptyline]  Review of Systems   Review of Systems  Constitutional: Positive for fever.  HENT: Negative for ear pain and sore throat.   Eyes: Negative for pain.  Respiratory: Negative for cough.   Cardiovascular: Negative for chest pain.  Gastrointestinal: Negative for abdominal pain.  Genitourinary: Negative for flank pain.  Musculoskeletal: Positive for back pain.  Skin: Negative for color change and rash.  Neurological: Negative for syncope.  All other systems reviewed and are negative.   Physical Exam Updated Vital Signs BP  (!) 147/87   Pulse 65   Temp 99.6 F (37.6 C) (Oral)   Resp 15   Ht 5\' 10"  (1.778 m)   Wt 99.8 kg   SpO2 93%   BMI 31.57 kg/m   Physical Exam Constitutional:      General: He is not in acute distress.    Appearance: He is well-developed.  HENT:     Head: Normocephalic.     Nose: Nose normal.  Eyes:     Extraocular Movements:  Extraocular movements intact.  Cardiovascular:     Rate and Rhythm: Normal rate.  Pulmonary:     Effort: Pulmonary effort is normal.  Skin:    Coloration: Skin is not jaundiced.  Neurological:     Mental Status: He is alert.     Comments: Currently the patient is awake and alert, answering all questions.  Appears oriented to person place and time.  Cranial nerves II to XII intact speech is normal. Upper extremity has 5/5 strength bilaterally.  Lower extremity has weakness in the right lower extremity 3 out of 5.  Patient unable to lift the leg off the bed, unable to bend his knee, has significant weakness of dorsiflexion, and some weakness of plantarflexion.  Sensation otherwise grossly intact.  Extremity appears warm and well-perfused.     ED Results / Procedures / Treatments   Labs (all labs ordered are listed, but only abnormal results are displayed) Labs Reviewed  COMPREHENSIVE METABOLIC PANEL - Abnormal; Notable for the following components:      Result Value   Potassium 3.2 (*)    Glucose, Bld 123 (*)    All other components within normal limits  CBG MONITORING, ED - Abnormal; Notable for the following components:   Glucose-Capillary 130 (*)    All other components within normal limits  CULTURE, BLOOD (ROUTINE X 2)  RESP PANEL BY RT-PCR (FLU A&B, COVID) ARPGX2  CULTURE, BLOOD (ROUTINE X 2)  ETHANOL  PROTIME-INR  APTT  CBC  DIFFERENTIAL  SEDIMENTATION RATE  C-REACTIVE PROTEIN  RAPID URINE DRUG SCREEN, HOSP PERFORMED  URINALYSIS, ROUTINE W REFLEX MICROSCOPIC    EKG None  Radiology CT Cervical Spine Wo Contrast  Result Date:  02/26/2021 CLINICAL DATA:  Acute neck pain, prior cervical spine surgery EXAM: CT CERVICAL SPINE WITHOUT CONTRAST TECHNIQUE: Multidetector CT imaging of the cervical spine was performed without intravenous contrast. Multiplanar CT image reconstructions were also generated. COMPARISON:  MR cervical spine 08/09/2020 FINDINGS: Alignment: Normal Skull base and vertebrae: Osseous mineralization normal. Slight reversal of cervical lordosis. Prior anterior fusion of C5-C6, hardware intact. Vertebral body heights maintained. Disc space narrowing C6-C7. Prior fusion of C7-T1 with removal of hardware. No fracture, subluxation, or bone destruction. Visualized skull base intact. Soft tissues and spinal canal: Prevertebral soft tissues normal thickness. Atherosclerotic calcification of carotid and vertebral arteries bilaterally. Disc levels:  No specific abnormalities. Upper chest: Lung apices clear Other: N/A IMPRESSION: Prior anterior fusion C5-C6 and C7-T1. Mild degenerative disc disease changes at C6-C7. Slight reversal of cervical lordosis. No acute cervical spine abnormalities. Electronically Signed   By: Lavonia Dana M.D.   On: 02/26/2021 12:25   CT Lumbar Spine Wo Contrast  Result Date: 02/26/2021 CLINICAL DATA:  Low back pain. Previous surgery. Confusion. Intermittent fevers. EXAM: CT LUMBAR SPINE WITHOUT CONTRAST TECHNIQUE: Multidetector CT imaging of the lumbar spine was performed without intravenous contrast administration. Multiplanar CT image reconstructions were also generated. COMPARISON:  MRI 08/09/2020 FINDINGS: Segmentation: 5 lumbar type vertebral bodies as numbered previously. Alignment: No malalignment. Vertebrae: No fracture or primary bone lesion. Paraspinal and other soft tissues: Negative Disc levels: No significant finding from T11-12 through L3-4. L4-5: Mild bulging of the disc. Bilateral facet degeneration and hypertrophy. No apparent compressive stenosis. Findings could relate to low back pain.  L5-S1: Previous discectomy and cage fusion. There is probably solid union. No finding to suggest ongoing motion at this level. Sufficient patency of the canal and foramina. IMPRESSION: No acute finding by CT. L4-5: Disc bulge. Facet osteoarthritis.  No compressive stenosis. Findings could relate to low back pain. L5-S1: Distant posterior decompression, diskectomy and cage fusion. Likely solid union with sufficient patency of the canal. Electronically Signed   By: Nelson Chimes M.D.   On: 02/26/2021 12:27   CT HEAD CODE STROKE WO CONTRAST  Result Date: 02/26/2021 CLINICAL DATA:  Code stroke. Acute presentation with mental status change. EXAM: CT HEAD WITHOUT CONTRAST TECHNIQUE: Contiguous axial images were obtained from the base of the skull through the vertex without intravenous contrast. COMPARISON:  12/09/2018 FINDINGS: Brain: Normal appearance without evidence of atrophy, old or acute infarction, mass lesion, hemorrhage, hydrocephalus or extra-axial collection. Vascular: There is atherosclerotic calcification of the major vessels at the base of the brain. Skull: Negative Sinuses/Orbits: Clear sinuses. Old medial wall blowout fracture right orbit. Other: None ASPECTS ([place] Stroke Program Early CT Score) - Ganglionic level infarction (caudate, lentiform nuclei, internal capsule, insula, M1-M3 cortex): 7 - Supraganglionic infarction (M4-M6 cortex): 3 Total score (0-10 with 10 being normal): 10 IMPRESSION: 1. Negative acute head CT.  Normal intracranial exam. 2. ASPECTS is 10. 3. These results were called by telephone at the time of interpretation on 02/26/2021 at 12:13 pm to provider [facility] , who verbally acknowledged these results. Electronically Signed   By: Nelson Chimes M.D.   On: 02/26/2021 12:14   CT ANGIO HEAD NECK W WO CM W PERF (CODE STROKE)  Result Date: 02/26/2021 CLINICAL DATA:  Acute mental status changes. EXAM: CT ANGIOGRAPHY HEAD AND NECK CT PERFUSION BRAIN TECHNIQUE: Multidetector CT imaging  of the head and neck was performed using the standard protocol during bolus administration of intravenous contrast. Multiplanar CT image reconstructions and MIPs were obtained to evaluate the vascular anatomy. Carotid stenosis measurements (when applicable) are obtained utilizing NASCET criteria, using the distal internal carotid diameter as the denominator. Multiphase CT imaging of the brain was performed following IV bolus contrast injection. Subsequent parametric perfusion maps were calculated using RAPID software. CONTRAST:  169mL OMNIPAQUE IOHEXOL 350 MG/ML SOLN COMPARISON:  None. Head CT earlier same day FINDINGS: CTA NECK FINDINGS Aortic arch: Aortic atherosclerosis. Branching pattern is normal without origin stenosis. Right carotid system: Common carotid artery widely patent to the bifurcation. Soft and calcified plaque at the carotid bifurcation but no stenosis. Cervical ICA is widely patent. Left carotid system: Common carotid artery widely patent to the bifurcation. Soft and calcified plaque at the carotid bifurcation and ICA bulb but no stenosis. Cervical ICA widely patent. Vertebral arteries: Right vertebral artery origin widely patent. 30% stenosis of the left vertebral artery origin. Wide patency of both vertebral arteries beyond that. Skeleton: Previous cervical fusion procedures C4 through C7. No acute bone finding. Other neck: 16 mm nodule left lobe of the thyroid. Upper chest: Emphysema without focal or active process. Review of the MIP images confirms the above findings CTA HEAD FINDINGS Anterior circulation: Both internal carotid arteries are widely patent through the skull base and siphon regions. Ordinary siphon atherosclerotic calcification but no stenosis greater than 30% suspected. The anterior and middle cerebral vessels are patent. No large or medium vessel occlusion. No aneurysm or vascular malformation. Posterior circulation: Both vertebral arteries widely patent to the basilar. No  basilar stenosis. Posterior circulation branch vessels widely patent. Venous sinuses: Patent and normal. Anatomic variants: None significant. Review of the MIP images confirms the above findings CT Brain Perfusion Findings: ASPECTS: 10 CBF (<30%) Volume: 4mL Perfusion (Tmax>6.0s) volume: 70mL Mismatch Volume: 60mL Infarction Location:None IMPRESSION: Normal perfusion examination. No acute CTA finding.  No intracranial large or medium vessel occlusion. Atherosclerotic disease at both carotid bifurcations but without stenosis or appreciable irregularity. Electronically Signed   By: Nelson Chimes M.D.   On: 02/26/2021 13:05    Procedures .Critical Care Performed by: Luna Fuse, MD Authorized by: Luna Fuse, MD   Critical care provider statement:    Critical care time (minutes):  30   Critical care time was exclusive of:  Separately billable procedures and treating other patients   Critical care was necessary to treat or prevent imminent or life-threatening deterioration of the following conditions:  CNS failure or compromise     Medications Ordered in ED Medications  iohexol (OMNIPAQUE) 350 MG/ML injection 125 mL (100 mLs Intravenous Contrast Given 02/26/21 1243)  sodium chloride 0.9 % bolus 1,000 mL (1,000 mLs Intravenous New Bag/Given 02/26/21 1305)    ED Course  I have reviewed the triage vital signs and the nursing notes.  Pertinent labs & imaging results that were available during my care of the patient were reviewed by me and considered in my medical decision making (see chart for details).    MDM Rules/Calculators/A&P                          Patient is been having vague symptoms over the course the last several weeks.  However he acutely has weakness in the right lower extremity since this morning.  Stroke alert was activated seen by stroke team and deemed not a tPA candidate given unclear onset of entirety of symptoms.  CT angio unremarkable.  Given persistent weakness will  pursue MRI.  Patient to be transferred to [facility] for MRI imaging of the spine.  Final Clinical Impression(s) / ED Diagnoses Final diagnoses:  Altered mental status, unspecified altered mental status type  Weakness of extremity    Rx / DC Orders ED Discharge Orders    None       Luna Fuse, MD 02/26/21 1329

## 2021-02-26 NOTE — ED Notes (Signed)
[facility] at bedside 

## 2021-02-26 NOTE — ED Notes (Signed)
Spoke with [facility] to set up transportation at this time.
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
## 2021-02-27 DIAGNOSIS — H532 Diplopia: Secondary | ICD-10-CM | POA: Diagnosis present

## 2021-02-27 DIAGNOSIS — M47816 Spondylosis without myelopathy or radiculopathy, lumbar region: Secondary | ICD-10-CM | POA: Diagnosis present

## 2021-02-27 DIAGNOSIS — G4700 Insomnia, unspecified: Secondary | ICD-10-CM | POA: Diagnosis present

## 2021-02-27 DIAGNOSIS — F1011 Alcohol abuse, in remission: Secondary | ICD-10-CM | POA: Diagnosis present

## 2021-02-27 DIAGNOSIS — F1120 Opioid dependence, uncomplicated: Secondary | ICD-10-CM | POA: Diagnosis present

## 2021-02-27 DIAGNOSIS — E876 Hypokalemia: Secondary | ICD-10-CM | POA: Diagnosis present

## 2021-02-27 DIAGNOSIS — R29898 Other symptoms and signs involving the musculoskeletal system: Secondary | ICD-10-CM | POA: Diagnosis not present

## 2021-02-27 DIAGNOSIS — I959 Hypotension, unspecified: Secondary | ICD-10-CM | POA: Diagnosis present

## 2021-02-27 DIAGNOSIS — R262 Difficulty in walking, not elsewhere classified: Secondary | ICD-10-CM | POA: Diagnosis present

## 2021-02-27 DIAGNOSIS — I444 Left anterior fascicular block: Secondary | ICD-10-CM | POA: Diagnosis present

## 2021-02-27 DIAGNOSIS — G929 Unspecified toxic encephalopathy: Secondary | ICD-10-CM | POA: Diagnosis present

## 2021-02-27 DIAGNOSIS — K5090 Crohn's disease, unspecified, without complications: Secondary | ICD-10-CM | POA: Diagnosis present

## 2021-02-27 DIAGNOSIS — E041 Nontoxic single thyroid nodule: Secondary | ICD-10-CM | POA: Diagnosis present

## 2021-02-27 DIAGNOSIS — G928 Other toxic encephalopathy: Secondary | ICD-10-CM | POA: Diagnosis present

## 2021-02-27 DIAGNOSIS — R531 Weakness: Secondary | ICD-10-CM | POA: Diagnosis present

## 2021-02-27 DIAGNOSIS — F319 Bipolar disorder, unspecified: Secondary | ICD-10-CM | POA: Diagnosis present

## 2021-02-27 DIAGNOSIS — F419 Anxiety disorder, unspecified: Secondary | ICD-10-CM | POA: Diagnosis present

## 2021-02-27 DIAGNOSIS — T424X6A Underdosing of benzodiazepines, initial encounter: Secondary | ICD-10-CM | POA: Diagnosis present

## 2021-02-27 DIAGNOSIS — E785 Hyperlipidemia, unspecified: Secondary | ICD-10-CM | POA: Diagnosis present

## 2021-02-27 DIAGNOSIS — W57XXXA Bitten or stung by nonvenomous insect and other nonvenomous arthropods, initial encounter: Secondary | ICD-10-CM | POA: Diagnosis present

## 2021-02-27 DIAGNOSIS — F13231 Sedative, hypnotic or anxiolytic dependence with withdrawal delirium: Secondary | ICD-10-CM | POA: Diagnosis present

## 2021-02-27 DIAGNOSIS — R9431 Abnormal electrocardiogram [ECG] [EKG]: Secondary | ICD-10-CM | POA: Diagnosis present

## 2021-02-27 DIAGNOSIS — G8929 Other chronic pain: Secondary | ICD-10-CM | POA: Diagnosis present

## 2021-02-27 DIAGNOSIS — I10 Essential (primary) hypertension: Secondary | ICD-10-CM | POA: Diagnosis present

## 2021-02-27 DIAGNOSIS — M50323 Other cervical disc degeneration at C6-C7 level: Secondary | ICD-10-CM | POA: Diagnosis present

## 2021-02-27 DIAGNOSIS — M5136 Other intervertebral disc degeneration, lumbar region: Secondary | ICD-10-CM | POA: Diagnosis present

## 2021-02-27 DIAGNOSIS — Z20822 Contact with and (suspected) exposure to covid-19: Secondary | ICD-10-CM | POA: Diagnosis present

## 2021-02-27 LAB — BASIC METABOLIC PANEL
Anion gap: 8 (ref 5–15)
BUN: 9 mg/dL (ref 8–23)
CO2: 24 mmol/L (ref 22–32)
Calcium: 9 mg/dL (ref 8.9–10.3)
Chloride: 108 mmol/L (ref 98–111)
Creatinine, Ser: 0.73 mg/dL (ref 0.61–1.24)
GFR, Estimated: >60 mL/min (ref >60)
Glucose, Bld: 98 mg/dL (ref 70–99)
Potassium: 3.6 mmol/L (ref 3.5–5.1)
Sodium: 140 mmol/L (ref 135–145)

## 2021-02-27 LAB — HEMOGLOBIN A1C
Hgb A1c MFr Bld: 5.5 % (ref 4.8–5.6)
Mean Plasma Glucose: 111 mg/dL

## 2021-02-27 LAB — CBC
HCT: 38.7 % — ABNORMAL LOW (ref 39.0–52.0)
Hemoglobin: 12.9 g/dL — ABNORMAL LOW (ref 13.0–17.0)
MCH: 29.1 pg (ref 26.0–34.0)
MCHC: 33.3 g/dL (ref 30.0–36.0)
MCV: 87.2 fL (ref 80.0–100.0)
Platelets: 222 10*3/uL (ref 150–400)
RBC: 4.44 MIL/uL (ref 4.22–5.81)
RDW: 13.3 % (ref 11.5–15.5)
WBC: 9.4 10*3/uL (ref 4.0–10.5)
nRBC: 0 % (ref 0.0–0.2)

## 2021-02-27 LAB — HIV ANTIBODY (ROUTINE TESTING W REFLEX): HIV Screen 4th Generation wRfx: NONREACTIVE

## 2021-02-27 MED ORDER — FENTANYL 50 MCG/HR TD PT72
1.0000 | MEDICATED_PATCH | Freq: Once | TRANSDERMAL | Status: DC
  Administered 2021-02-27: 1 via TRANSDERMAL
  Filled 2021-02-27: qty 1

## 2021-02-27 MED ORDER — FENTANYL 50 MCG/HR TD PT72: 1.0000 | MEDICATED_PATCH | TRANSDERMAL | Status: DC

## 2021-02-27 NOTE — Progress Notes (Addendum)
IMTS Update:    Paged by nursing staff due to worsening agitation. Patient had been speaking nonsensically, trying to get up out of bed and had been swinging at staff. Soft wrist and ankle restraints were applied for patient safety and he was given 4mg  Ativan.   On examination, patient was sleeping comfortably, saturating in the 90's on room air and otherwise HDS. Wife and RN were notified to please page back if symptoms worsen despite current medications as scheduled.   Jeralyn Bennett, MD 02/27/2021, 12:25 AM Pager: [phone]

## 2021-02-27 NOTE — ED Notes (Signed)
Attempted report x 2- 4NP refused

## 2021-02-27 NOTE — Progress Notes (Addendum)
Pt arrived to 4NP04 from ED. Orders for restraints in Epic, but upon arrival pt not in restraints, pleasant, calm, and alert and orientedx4. However, he does talk about seeing "farm animals in the basement" including "hundreds of pigs in the parking lot." At this time pt does seem to be aware that these were hallucinations. Vitals WNL, assessment completed, dinner ordered. Bed locked and lowered with call bell in reach. Wife now at bedside.  Justice Rocher, RN

## 2021-02-27 NOTE — Evaluation (Signed)
Physical Therapy Evaluation Patient Details Name: Stephen Porter MRN: [id number] DOB: [birth year] Today's Date: 02/27/2021   History of Present Illness  Pt is a 65 y/o male admitted secondary to difficulty walking and AMS. Thought to have toxic metabolic encephalopathy secondary to Benzodiazepine Withdrawal. PMH includesbipolar disorder, DDD, chron's and tobacco use.  Clinical Impression  Pt admitted secondary to problem above with deficits below. Pt shaky during mobility tasks and with difficulty sequencing. Mod A for bed mobility and min guard A for transfers and gait using RW. Ambulated short distance in ED room. Anticipate pt will progress well as symptoms improve, but will benefit from continued PT acutely prior to d/c home. Recommending 24/7 family support at d/c. If family unable to provide, may need to consider ST SNF. Will continue to follow acutely.     Follow Up Recommendations Home health PT;Supervision/Assistance - 24 hour (pending progression and family availability to assist)    Equipment Recommendations  Rolling walker with 5" wheels    Recommendations for Other Services       Precautions / Restrictions Precautions Precautions: Fall Restrictions Weight Bearing Restrictions: No      Mobility  Bed Mobility Overal bed mobility: Needs Assistance Bed Mobility: Supine to Sit;Sit to Supine     Supine to sit: Mod assist Sit to supine: Supervision   General bed mobility comments: Mod A for trunk and LE assist to come to sitting. Pt with difficulty sequencing and requiring multimodal cues.    Transfers Overall transfer level: Needs assistance Equipment used: Rolling walker (2 wheeled) Transfers: Sit to/from Stand Sit to Stand: Min guard         General transfer comment: Min guard for safety to stand from higher stretcher height. Pt shaky, but no overt LOB noted.  Ambulation/Gait Ambulation/Gait assistance: Min guard Gait Distance (Feet): 5 Feet Assistive  device: Rolling walker (2 wheeled) Gait Pattern/deviations: Step-through pattern;Decreased stride length Gait velocity: Decreased   General Gait Details: Slow, unsteady gait. Shakiness noted throughout. Min guard A for safety.  Stairs            Wheelchair Mobility    Modified Rankin (Stroke Patients Only)       Balance Overall balance assessment: Needs assistance Sitting-balance support: No upper extremity supported;Feet supported Sitting balance-Leahy Scale: Fair     Standing balance support: Bilateral upper extremity supported;During functional activity Standing balance-Leahy Scale: Poor Standing balance comment: Reliant on BUE support                             Pertinent Vitals/Pain Pain Assessment: No/denies pain    Home Living Family/patient expects to be discharged to:: Private residence Living Arrangements: Spouse/significant other Available Help at Discharge: Family Type of Home: House Home Access: Level entry     Home Layout: One level Home Equipment: Electronics engineer Comments: Unsure of home information as no family present    Prior Function Level of Independence: Independent               Hand Dominance        Extremity/Trunk Assessment   Upper Extremity Assessment Upper Extremity Assessment: Defer to OT evaluation    Lower Extremity Assessment Lower Extremity Assessment: Generalized weakness    Cervical / Trunk Assessment Cervical / Trunk Assessment: Normal  Communication   Communication: No difficulties  Cognition Arousal/Alertness: Awake/alert Behavior During Therapy: Flat affect Overall Cognitive Status: No family/caregiver present to determine baseline cognitive functioning  General Comments: Disoriented to situation and place. REported it was june, but then states it was 59 and then 2025. Required multimodal cues for sequencing.      General Comments  General comments (skin integrity, edema, etc.): No family present    Exercises     Assessment/Plan    PT Assessment Patient needs continued PT services  PT Problem List Decreased strength;Decreased balance;Decreased activity tolerance;Decreased mobility;Decreased knowledge of use of DME;Decreased knowledge of precautions;Decreased safety awareness;Decreased cognition       PT Treatment Interventions Gait training;DME instruction;Stair training;Functional mobility training;Therapeutic activities;Therapeutic exercise;Balance training;Cognitive remediation;Patient/family education    PT Goals (Current goals can be found in the Care Plan section)  Acute Rehab PT Goals Patient Stated Goal: to take a nap PT Goal Formulation: With patient Time For Goal Achievement: 03/13/21 Potential to Achieve Goals: Good    Frequency Min 3X/week   Barriers to discharge        Co-evaluation               AM-PAC PT "6 Clicks" Mobility  Outcome Measure Help needed turning from your back to your side while in a flat bed without using bedrails?: A Little Help needed moving from lying on your back to sitting on the side of a flat bed without using bedrails?: A Lot Help needed moving to and from a bed to a chair (including a wheelchair)?: A Little Help needed standing up from a chair using your arms (e.g., wheelchair or bedside chair)?: A Little Help needed to walk in hospital room?: A Little Help needed climbing 3-5 steps with a railing? : A Lot 6 Click Score: 16    End of Session Equipment Utilized During Treatment: Gait belt Activity Tolerance: Patient tolerated treatment well Patient left: in bed;with call bell/phone within reach (on stretcher in ED) Nurse Communication: Mobility status PT Visit Diagnosis: Unsteadiness on feet (R26.81);Muscle weakness (generalized) (M62.81)    Time: 8250-5397 PT Time Calculation (min) (ACUTE ONLY): 15 min   Charges:   PT Evaluation $PT Eval Moderate  Complexity: 1 Mod          Reuel Derby, PT, DPT  Acute Rehabilitation Services  Pager: [phone] Office: [phone]   Rudean Hitt 02/27/2021, 1:27 PM

## 2021-02-27 NOTE — Progress Notes (Signed)
HD#0 Subjective:  Overnight Events:  Xanax prescribed for anxiety/sleep, follows with Dr. Harrington Challenger psych in [place].   Tremors/agitation started 3 days ago. Last xanax dose was 10 days ago. Ran out early, patient took extra pills per spouse.   This morning when patient was awake, no hallucinations but still confused.   Patient's leg weakness has resolved since admission.  Feels terrific. Follows some commands. [place]. When asked who his wife is, he states " my lady." When asked to state her name patient gets frustrated.    Objective:  Vital signs in last 24 hours: Vitals:   02/27/21 1045 02/27/21 1100 02/27/21 1115 02/27/21 1205  BP: (!) 149/129 (!) 147/97 130/64 136/86  Pulse: 66 81 67 78  Resp: 18 (!) 25 17 20   Temp:      TempSrc:      SpO2: 92% 96% 96% 96%  Weight:      Height:       Physical Exam:  Constitutional: Elderly male, lying in bed, in no acute distress Eyes: conjunctiva non-erythematous Cardiovascular: normal rate and regular rhythm, no m/r/g. Pulmonary/Chest: normal work of breathing on room air, lungs clear to auscultation bilaterally Abdominal: soft, non-tender, non-distended, normoactive bowel sounds MSK: normal bulk and tone Neurological: alert & oriented only to self, 5/5 strength in bilateral upper and lower extremities, tremorous on exam. Incoherent speech at times. Skin: warm and dry Psych: Fidgety but calm and cooperative.  Pertinent Labs: CBC Latest Ref Rng & Units 02/27/2021 02/26/2021 12/09/2018  WBC 4.0 - 10.5 K/uL 9.4 9.4 11.3(H)  Hemoglobin 13.0 - 17.0 g/dL 12.9(L) 15.5 15.5  Hematocrit 39.0 - 52.0 % 38.7(L) 46.5 47.0  Platelets 150 - 400 K/uL 222 273 236    CMP Latest Ref Rng & Units 02/27/2021 02/26/2021 02/26/2021  Glucose 70 - 99 mg/dL 98 - 123(H)  BUN 8 - 23 mg/dL 9 - 10  Creatinine 0.61 - 1.24 mg/dL 0.73 0.75 0.78  Sodium 135 - 145 mmol/L 140 - 139  Potassium 3.5 - 5.1 mmol/L 3.6 - 3.2(L)  Chloride 98 - 111 mmol/L  108 - 104  CO2 22 - 32 mmol/L 24 - 24  Calcium 8.9 - 10.3 mg/dL 9.0 - 9.5  Total Protein 6.5 - 8.1 g/dL - - 7.7  Total Bilirubin 0.3 - 1.2 mg/dL - - 1.1  Alkaline Phos 38 - 126 U/L - - 77  AST 15 - 41 U/L - - 30  ALT 0 - 44 U/L - - 26    Imaging: MR BRAIN WO CONTRAST  Result Date: 02/26/2021 CLINICAL DATA:  Acute mental status changes. EXAM: MRI HEAD WITHOUT CONTRAST TECHNIQUE: Multiplanar, multiecho pulse sequences of the brain and surrounding structures were obtained without intravenous contrast. COMPARISON:  CT angiography same day. FINDINGS: Brain: Diffusion imaging does not show any acute or subacute infarction. No abnormality affects the brainstem or cerebellum. Cerebral hemispheres show a very few scattered foci of T2 and FLAIR signal within the hemispheric white matter, likely indicating the earliest manifestation of small vessel change. No cortical or large vessel territory infarction. No mass lesion, hemorrhage, hydrocephalus or extra-axial collection. Vascular: Major vessels at the base of the brain show flow. Skull and upper cervical spine: Negative Sinuses/Orbits: Sinuses are clear. Old medial orbital wall blowout fracture on right. Other: None IMPRESSION: No acute finding. Minimal chronic small vessel change of the cerebral hemispheric white. Electronically Signed   By: Nelson Chimes M.D.   On: 02/26/2021 19:14  CT ANGIO HEAD NECK W WO CM W PERF (CODE STROKE)  Addendum Date: 02/26/2021   ADDENDUM REPORT: 02/26/2021 16:54 ADDENDUM: 16 mm incidental thyroid nodule on the left. Recommend thyroid US (ref: J Am Coll Radiol. 2015 Feb;12(2): 143-50). Electronically Signed   By: Nelson Chimes M.D.   On: 02/26/2021 16:54   Result Date: 02/26/2021 CLINICAL DATA:  Acute mental status changes. EXAM: CT ANGIOGRAPHY HEAD AND NECK CT PERFUSION BRAIN TECHNIQUE: Multidetector CT imaging of the head and neck was performed using the standard protocol during bolus administration of intravenous contrast.  Multiplanar CT image reconstructions and MIPs were obtained to evaluate the vascular anatomy. Carotid stenosis measurements (when applicable) are obtained utilizing NASCET criteria, using the distal internal carotid diameter as the denominator. Multiphase CT imaging of the brain was performed following IV bolus contrast injection. Subsequent parametric perfusion maps were calculated using RAPID software. CONTRAST:  162mL OMNIPAQUE IOHEXOL 350 MG/ML SOLN COMPARISON:  None. Head CT earlier same day FINDINGS: CTA NECK FINDINGS Aortic arch: Aortic atherosclerosis. Branching pattern is normal without origin stenosis. Right carotid system: Common carotid artery widely patent to the bifurcation. Soft and calcified plaque at the carotid bifurcation but no stenosis. Cervical ICA is widely patent. Left carotid system: Common carotid artery widely patent to the bifurcation. Soft and calcified plaque at the carotid bifurcation and ICA bulb but no stenosis. Cervical ICA widely patent. Vertebral arteries: Right vertebral artery origin widely patent. 30% stenosis of the left vertebral artery origin. Wide patency of both vertebral arteries beyond that. Skeleton: Previous cervical fusion procedures C4 through C7. No acute bone finding. Other neck: 16 mm nodule left lobe of the thyroid. Upper chest: Emphysema without focal or active process. Review of the MIP images confirms the above findings CTA HEAD FINDINGS Anterior circulation: Both internal carotid arteries are widely patent through the skull base and siphon regions. Ordinary siphon atherosclerotic calcification but no stenosis greater than 30% suspected. The anterior and middle cerebral vessels are patent. No large or medium vessel occlusion. No aneurysm or vascular malformation. Posterior circulation: Both vertebral arteries widely patent to the basilar. No basilar stenosis. Posterior circulation branch vessels widely patent. Venous sinuses: Patent and normal. Anatomic  variants: None significant. Review of the MIP images confirms the above findings CT Brain Perfusion Findings: ASPECTS: 10 CBF (<30%) Volume: 87mL Perfusion (Tmax>6.0s) volume: 8mL Mismatch Volume: 70mL Infarction Location:None IMPRESSION: Normal perfusion examination. No acute CTA finding. No intracranial large or medium vessel occlusion. Atherosclerotic disease at both carotid bifurcations but without stenosis or appreciable irregularity. Electronically Signed: By: Nelson Chimes M.D. On: 02/26/2021 13:05    Assessment/Plan:   Active Problems:   Toxic encephalopathy   Patient Summary: TRISTON SKARE is a 66 y.o. with a pertinent PMH of bipolar 1 disorder, tobacco use disorder, opioid use disorder, degenerative disc disease, hypertension, who presented with agitation and tremors and admitted for suspected benzodiazepine withdrawal.   Polysubstance use disorder Benzodiazepine withdrawal Per spouse, patient on benzodiazepines for sleep and anxiety.  States that he was taking more than his recommended dose prior to onset of symptoms and then ran out of medications which aligned with when his symptoms began.  Patient still having diffuse tremors and agitation, but no longer having visual hallucinations.  Psychiatry has been consulted for medication recommendations, appreciate assistance. -Continue Librium taper -CIWA with Ativan -Atarax q6h prn for anxiety -Continue home fentanyl patch and nicotine patch -Folic acid, thiamine -Regular diet -PT/OT eval and treat  Hypokalemia Potassium normalized with repletion.  Continue to trend BMPs. -daily BMP, replete as needed to maintain K >4  Bipolar 1 disorder Anxiety -Continue fluoxetine and lamotrigine  HTN HLD BP well controlled with norvasc 5mg . -monitor daily vitals, titrate up norvasc if needed. -continue norvasc -continue zetia 10mg  daily -f/u lipid panel  Chronic back pain w/ opioid dependence Degenerative disc disease Doing well on  tylenol 650mg  q6h prn and fentanyl 64mcg/hr patch q72h. -continue tylenol and fentanyl patch  Virl Axe, MD 02/27/2021, 12:55 PM Pager: [phone]

## 2021-02-27 NOTE — ED Notes (Addendum)
Reached out to pharmacy regarding MDs verbal order to given Fentanyl patch. Pharmacy replied stating patch would be verified when patient goes upstairs. Previous  Fentanyl patch removed from stomach

## 2021-02-27 NOTE — Consult Note (Signed)
[facility] Consult   Reason for Consult:  Medication recc's, Hx of Bipolar disorder and Anxiety Referring Physician:  Aldine Contes, MD Patient Identification: Stephen Porter MRN:  [id number] Principal Diagnosis: <principal problem not specified> Diagnosis:  Active Problems:   Toxic encephalopathy   Total Time spent with patient: 30 minutes  Subjective:   Stephen Porter is a 65 y.o. male patient admitted with  Benzodiazapine withdrawal who has a hx of bipolar I disorder, anxiety, Benzo use disorder, Opioid use disorder (oxycodone), and distant Substance use disorder, TIA, Carotid Artery Stenosis, HLD, TUD, DDD and Crohn's Disease. Per EMR patient was prescribed Xanax 0.5mg  QID PRN for anxiety. Per Primary team the patient endorsed that he was taking 10-12 pills per day and he had stopped taking Xanax 6 days prior to presentation.   HPI:   On assessment this AM patient is restless in his bed and trying to unsuccessfully to find the arms hole in his gown. Patient is a bit delirious as his ability to interact on exam appears to wax and wane. Patient reports his mood is "ok." At times patient is able to answer questions and at other times patient makes statements that appear random. Patient has poor attention span but a decent concentration as he was able to spell "world" backwards after 2 attempts and was able to complete basic math based money problems on his first attempt.  Patient was able to endorse that he has an OP psychiatrist Dr. Abundio Miu who prescribes his Xanax. Patient also endorsed that he was taking his Prozac and lamictal as prescribed for his Bipolar diagnosis. Patient reported that he was taking his Xanax up to 4 times a day but sometimes 2 times / day until 2 weeks ago when he reports he ran out. Provider asks for clarification because he initially reported to the primary team that he stopped taking Xanax 6 days ago. Patient reports he is not sure but he  repeats that he thinks he ran out 2 weeks ago. When provider points out that patient picked up his prescription less than 1 mon ago and should not have run out so fast if he was taking QID, patient reports "are you math person. You sound like my mom." Patient reports he did not think he would "be this bad" when he stopped taking Xanax.   As noted above patient would intermittently make random statements and indicated that he was having AH as he endorsed that he heard the provider say things when the provider was not talking. Patient denied SI, HI, and VH.    Past Psychiatric History: Bipolar disorder, Anxiety, Benzodiazepine use disorder, EtoH use disorder, Opioid use disorder (oxycodone)  Risk to Self:  NO Risk to Others:  NO Prior Inpatient Therapy:   Detox at [facility] and 2013 @ [facility]: Prior medications include Cymbalta Prior Outpatient Therapy:   YES, Dr. Levonne Spiller, current  Past Medical History:  Past Medical History:  Diagnosis Date  . Bipolar disorder ([place])   . DDD (degenerative disc disease)   . Depression   . Hyperlipidemia   . Insomnia   . Jejunal ulcer   . Leukocytosis 04/12/2016  . Skin rash     Past Surgical History:  Procedure Laterality Date  . BACK SURGERY    . CERVICAL SPINE SURGERY     x3  . CHOLECYSTECTOMY    . COLONOSCOPY  2006  . COLONOSCOPY N/A 06/30/2014   Procedure: COLONOSCOPY;  Surgeon: Rogene Houston, MD;  Location: [facility];  Service: Endoscopy;  Laterality: N/A;  830  . LUMBAR SPINE SURGERY    . UPPER GASTROINTESTINAL ENDOSCOPY     Family History:  Family History  Problem Relation Age of Onset  . Hypertension Sister   . Anxiety disorder Sister   . Hypertension Brother   . Hypertension Brother   . Hypertension Brother   . Dementia Mother   . Paranoid behavior Mother   . Dementia Father   . Alcohol abuse Paternal Uncle   . Drug abuse Paternal Uncle   . ADD / ADHD Neg Hx   . Depression Neg Hx   . OCD Neg Hx   . Schizophrenia Neg  Hx   . Seizures Neg Hx   . Sexual abuse Neg Hx   . Physical abuse Neg Hx   . Colon cancer Neg Hx    Family Psychiatric  History: Per above Social History:  Social History   Substance and Sexual Activity  Alcohol Use No   Comment: 07-05-2016 per pt no     Social History   Substance and Sexual Activity  Drug Use No   Comment: 07-05-2016 per pt no and he stopped Marijuana 1 yr ago    Social History   Socioeconomic History  . Marital status: Married    Spouse name: Not on file  . Number of children: Not on file  . Years of education: Not on file  . Highest education level: Not on file  Occupational History  . Occupation: Scientist, research (life sciences): UNEMPLOYED  Tobacco Use  . Smoking status: Former Smoker    Packs/day: 1.00    Years: 40.00    Pack years: 40.00    Types: Cigarettes  . Smokeless tobacco: Current User  . Tobacco comment: 07-05-2016 Dip, 01-22-2017 per pt he stopped Sep 25, 2016  Vaping Use  . Vaping Use: Never used  Substance and Sexual Activity  . Alcohol use: No    Comment: 07-05-2016 per pt no  . Drug use: No    Comment: 07-05-2016 per pt no and he stopped Marijuana 1 yr ago  . Sexual activity: Not on file  Other Topics Concern  . Not on file  Social History Narrative   Married with 3 children and lives locally   No regular exercise   Social Determinants of Health   Financial Resource Strain: Not on file  Food Insecurity: Not on file  Transportation Needs: Not on file  Physical Activity: Not on file  Stress: Not on file  Social Connections: Not on file   Additional Social History:    Allergies:   Allergies  Allergen Reactions  . Elavil [Amitriptyline] Other (See Comments)    Made the patient "shaky"    Labs:  Results for orders placed or performed during the hospital encounter of 02/26/21 (from the past 48 hour(s))  CBG monitoring, ED     Status: Abnormal   Collection Time: 02/26/21 11:52 AM  Result Value Ref Range   Glucose-Capillary  130 (H) 70 - 99 mg/dL    Comment: Glucose reference range applies only to samples taken after fasting for at least 8 hours.  Resp Panel by RT-PCR (Flu A&B, Covid) Nasopharyngeal Swab     Status: None   Collection Time: 02/26/21 12:03 PM   Specimen: Nasopharyngeal Swab; Nasopharyngeal(NP) swabs in vial transport medium  Result Value Ref Range   SARS Coronavirus 2 by RT PCR NEGATIVE NEGATIVE    Comment: (NOTE) SARS-CoV-2 target nucleic  acids are NOT DETECTED.  The SARS-CoV-2 RNA is generally detectable in upper respiratory specimens during the acute phase of infection. The lowest concentration of SARS-CoV-2 viral copies this assay can detect is 138 copies/mL. A negative result does not preclude SARS-Cov-2 infection and should not be used as the sole basis for treatment or other patient management decisions. A negative result may occur with  improper specimen collection/handling, submission of specimen other than nasopharyngeal swab, presence of viral mutation(s) within the areas targeted by this assay, and inadequate number of viral copies(<138 copies/mL). A negative result must be combined with clinical observations, patient history, and epidemiological information. The expected result is Negative.  Fact Sheet for Patients:  EntrepreneurPulse.com.au  Fact Sheet for Healthcare Providers:  IncredibleEmployment.be  This test is no t yet approved or cleared by the [place] FDA and  has been authorized for detection and/or diagnosis of SARS-CoV-2 by FDA under an Emergency Use Authorization (EUA). This EUA will remain  in effect (meaning this test can be used) for the duration of the COVID-19 declaration under Section 564(b)(1) of the Act, 21 U.S.C.section 360bbb-3(b)(1), unless the authorization is terminated  or revoked sooner.       Influenza A by PCR NEGATIVE NEGATIVE   Influenza B by PCR NEGATIVE NEGATIVE    Comment: (NOTE) The Xpert  Xpress SARS-CoV-2/FLU/RSV plus assay is intended as an aid in the diagnosis of influenza from Nasopharyngeal swab specimens and should not be used as a sole basis for treatment. Nasal washings and aspirates are unacceptable for Xpert Xpress SARS-CoV-2/FLU/RSV testing.  Fact Sheet for Patients: EntrepreneurPulse.com.au  Fact Sheet for Healthcare Providers: IncredibleEmployment.be  This test is not yet approved or cleared by the [place] FDA and has been authorized for detection and/or diagnosis of SARS-CoV-2 by FDA under an Emergency Use Authorization (EUA). This EUA will remain in effect (meaning this test can be used) for the duration of the COVID-19 declaration under Section 564(b)(1) of the Act, 21 U.S.C. section 360bbb-3(b)(1), unless the authorization is terminated or revoked.  Performed at [facility], [address], [place], [place] [address]   Ethanol     Status: None   Collection Time: 02/26/21 12:03 PM  Result Value Ref Range   Alcohol, Ethyl (B) <10 <10 mg/dL    Comment: (NOTE) Lowest detectable limit for serum alcohol is 10 mg/dL.  For medical purposes only. Performed at [facility], [address], [place], [place] [address]   Protime-INR     Status: None   Collection Time: 02/26/21 12:03 PM  Result Value Ref Range   Prothrombin Time 13.5 11.4 - 15.2 seconds   INR 1.0 0.8 - 1.2    Comment: (NOTE) INR goal varies based on device and disease states. Performed at [facility], [address], [place], [place] [address]   APTT     Status: None   Collection Time: 02/26/21 12:03 PM  Result Value Ref Range   aPTT 32 24 - 36 seconds    Comment: Performed at [facility], [address] [place], [place] [address]  CBC     Status: None   Collection Time: 02/26/21 12:03 PM  Result Value Ref Range   WBC 9.4 4.0 - 10.5 K/uL   RBC 5.39 4.22 - 5.81 MIL/uL   Hemoglobin 15.5 13.0 - 17.0 g/dL   HCT 46.5 39.0 - 52.0 %   MCV 86.3  80.0 - 100.0 fL   MCH 28.8 26.0 - 34.0 pg   MCHC 33.3 30.0 -  36.0 g/dL   RDW 13.4 11.5 - 15.5 %   Platelets 273 150 - 400 K/uL   nRBC 0.0 0.0 - 0.2 %    Comment: Performed at [facility], [address] [place], [place] [address]  Differential     Status: None   Collection Time: 02/26/21 12:03 PM  Result Value Ref Range   Neutrophils Relative % 75 %   Neutro Abs 7.0 1.7 - 7.7 K/uL   Lymphocytes Relative 18 %   Lymphs Abs 1.7 0.7 - 4.0 K/uL   Monocytes Relative 7 %   Monocytes Absolute 0.6 0.1 - 1.0 K/uL   Eosinophils Relative 0 %   Eosinophils Absolute 0.0 0.0 - 0.5 K/uL   Basophils Relative 0 %   Basophils Absolute 0.0 0.0 - 0.1 K/uL   Immature Granulocytes 0 %   Abs Immature Granulocytes 0.02 0.00 - 0.07 K/uL    Comment: Performed at [facility], [address] [place], [place] [address]  Comprehensive metabolic panel     Status: Abnormal   Collection Time: 02/26/21 12:03 PM  Result Value Ref Range   Sodium 139 135 - 145 mmol/L   Potassium 3.2 (L) 3.5 - 5.1 mmol/L   Chloride 104 98 - 111 mmol/L   CO2 24 22 - 32 mmol/L   Glucose, Bld 123 (H) 70 - 99 mg/dL    Comment: Glucose reference range applies only to samples taken after fasting for at least 8 hours.   BUN 10 8 - 23 mg/dL   Creatinine, Ser 0.78 0.61 - 1.24 mg/dL   Calcium 9.5 8.9 - 10.3 mg/dL   Total Protein 7.7 6.5 - 8.1 g/dL   Albumin 4.6 3.5 - 5.0 g/dL   AST 30 15 - 41 U/L   ALT 26 0 - 44 U/L   Alkaline Phosphatase 77 38 - 126 U/L   Total Bilirubin 1.1 0.3 - 1.2 mg/dL   GFR, Estimated >60 >60 mL/min    Comment: (NOTE) Calculated using the CKD-EPI Creatinine Equation (2021)    Anion gap 11 5 - 15    Comment: Performed at [facility], [address], [place], [place] [address]  Sedimentation rate     Status: None   Collection Time: 02/26/21 12:03 PM  Result Value Ref Range   Sed Rate 15 0 - 16 mm/hr    Comment: Performed at [facility], [address] [place], [place] [address]  C-reactive protein      Status: None   Collection Time: 02/26/21 12:05 PM  Result Value Ref Range   CRP 0.6 <1.0 mg/dL    Comment: Performed at [facility], [address], [place], [place] [address]  Culture, blood (routine x 2)     Status: None (Preliminary result)   Collection Time: 02/26/21 12:18 PM   Specimen: Right Antecubital; Blood  Result Value Ref Range   Specimen Description      RIGHT ANTECUBITAL BOTTLES DRAWN AEROBIC AND ANAEROBIC   Special Requests Blood Culture adequate volume    Culture      NO GROWTH < 24 HOURS Performed at [facility], [address] [place], [place] [address]    Report Status PENDING   Culture, blood (routine x 2)     Status: None (Preliminary result)   Collection Time: 02/26/21  1:23 PM   Specimen: BLOOD LEFT HAND  Result Value Ref Range   Specimen Description      BLOOD LEFT HAND BOTTLES DRAWN AEROBIC AND ANAEROBIC   Special  Requests Blood Culture adequate volume    Culture      NO GROWTH < 24 HOURS Performed at [facility], [address] [place], [place] [address]    Report Status PENDING   Urine rapid drug screen (hosp performed)     Status: None   Collection Time: 02/26/21  2:00 PM  Result Value Ref Range   Opiates NONE DETECTED NONE DETECTED   Cocaine NONE DETECTED NONE DETECTED   Benzodiazepines NONE DETECTED NONE DETECTED   Amphetamines NONE DETECTED NONE DETECTED   Tetrahydrocannabinol NONE DETECTED NONE DETECTED   Barbiturates NONE DETECTED NONE DETECTED    Comment: (NOTE) DRUG SCREEN FOR MEDICAL PURPOSES ONLY.  IF CONFIRMATION IS NEEDED FOR ANY PURPOSE, NOTIFY LAB WITHIN 5 DAYS.  LOWEST DETECTABLE LIMITS FOR URINE DRUG SCREEN Drug Class                     Cutoff (ng/mL) Amphetamine and metabolites    1000 Barbiturate and metabolites    200 Benzodiazepine                 370 Tricyclics and metabolites     300 Opiates and metabolites        300 Cocaine and metabolites        300 THC                            50 Performed at [facility], [address], [place], [place] [address]   Urinalysis, Routine w reflex microscopic     Status: Abnormal   Collection Time: 02/26/21  2:00 PM  Result Value Ref Range   Color, Urine YELLOW YELLOW   APPearance CLEAR CLEAR   Specific Gravity, Urine 1.044 (H) 1.005 - 1.030   pH 7.0 5.0 - 8.0   Glucose, UA NEGATIVE NEGATIVE mg/dL   Hgb urine dipstick NEGATIVE NEGATIVE   Bilirubin Urine NEGATIVE NEGATIVE   Ketones, ur 20 (A) NEGATIVE mg/dL   Protein, ur NEGATIVE NEGATIVE mg/dL   Nitrite NEGATIVE NEGATIVE   Leukocytes,Ua NEGATIVE NEGATIVE    Comment: Performed at [facility], [address] [place], [place] [address]  HIV Antibody (routine testing w rflx)     Status: None   Collection Time: 02/26/21  9:23 PM  Result Value Ref Range   HIV Screen 4th Generation wRfx Non Reactive Non Reactive    Comment: Performed at [facility], [place] [address], [place], [place] [address]  Creatinine, serum     Status: None   Collection Time: 02/26/21  9:23 PM  Result Value Ref Range   Creatinine, Ser 0.75 0.61 - 1.24 mg/dL   GFR, Estimated >60 >60 mL/min    Comment: (NOTE) Calculated using the CKD-EPI Creatinine Equation (2021) Performed at [place] [address] [place], [place] [address]   Magnesium     Status: None   Collection Time: 02/26/21  9:23 PM  Result Value Ref Range   Magnesium 2.0 1.7 - 2.4 mg/dL    Comment: Performed at [facility], [place] [address] [place], [place] [address]  Phosphorus     Status: None   Collection Time: 02/26/21  9:23 PM  Result Value Ref Range   Phosphorus 3.2 2.5 - 4.6 mg/dL    Comment: Performed at [facility], [place] [address], [place], [place] [address]  TSH     Status: None   Collection Time:  02/26/21  9:26 PM  Result Value Ref Range   TSH 1.082 0.350 - 4.500 uIU/mL    Comment: Performed by a 3rd Generation assay with a functional sensitivity of <=0.01 uIU/mL. Performed at [facility], [place] [address]  [place], [place] [address]   Basic metabolic panel     Status: None   Collection Time: 02/27/21  5:00 AM  Result Value Ref Range   Sodium 140 135 - 145 mmol/L   Potassium 3.6 3.5 - 5.1 mmol/L   Chloride 108 98 - 111 mmol/L   CO2 24 22 - 32 mmol/L   Glucose, Bld 98 70 - 99 mg/dL    Comment: Glucose reference range applies only to samples taken after fasting for at least 8 hours.   BUN 9 8 - 23 mg/dL   Creatinine, Ser 0.73 0.61 - 1.24 mg/dL   Calcium 9.0 8.9 - 10.3 mg/dL   GFR, Estimated >60 >60 mL/min    Comment: (NOTE) Calculated using the CKD-EPI Creatinine Equation (2021)    Anion gap 8 5 - 15    Comment: Performed at [place] [address], [place], [place] [address]  CBC     Status: Abnormal   Collection Time: 02/27/21  5:00 AM  Result Value Ref Range   WBC 9.4 4.0 - 10.5 K/uL   RBC 4.44 4.22 - 5.81 MIL/uL   Hemoglobin 12.9 (L) 13.0 - 17.0 g/dL   HCT 38.7 (L) 39.0 - 52.0 %   MCV 87.2 80.0 - 100.0 fL   MCH 29.1 26.0 - 34.0 pg   MCHC 33.3 30.0 - 36.0 g/dL   RDW 13.3 11.5 - 15.5 %   Platelets 222 150 - 400 K/uL   nRBC 0.0 0.0 - 0.2 %    Comment: Performed at [facility], [place] [address], [place], [place] [address]    Current Facility-Administered Medications  Medication Dose Route Frequency Provider Last Rate Last Admin  . acetaminophen (TYLENOL) tablet 650 mg  650 mg Oral Q6H PRN Marianna Payment, MD       Or  . acetaminophen (TYLENOL) suppository 650 mg  650 mg Rectal Q6H PRN Marianna Payment, MD      . amLODipine (NORVASC) tablet 5 mg  5 mg Oral QHS Marianna Payment, MD   5 mg at 02/27/21 1036  . chlordiazePOXIDE (LIBRIUM) capsule 25 mg  25 mg Oral TID Marianna Payment, MD       Followed by  . [START ON 02/28/2021] chlordiazePOXIDE (LIBRIUM) capsule 25 mg  25 mg Oral Rosario Adie, MD       Followed by  . [START ON 03/01/2021] chlordiazePOXIDE (LIBRIUM) capsule 25 mg  25 mg Oral Daily Marianna Payment, MD      . enoxaparin (LOVENOX) injection 40 mg  40 mg  Subcutaneous Q24H Marianna Payment, MD   40 mg at 02/26/21 2229  . ezetimibe (ZETIA) tablet 10 mg  10 mg Oral Daily Marianna Payment, MD   10 mg at 02/26/21 2224  . fentaNYL (DURAGESIC) 50 MCG/HR 1 patch  1 patch Transdermal Q72H Jeralyn Bennett, MD   1 patch at 02/27/21 0719  . fentaNYL (DURAGESIC) 50 MCG/HR 1 patch  1 patch Transdermal Once Aldine Contes, MD   1 patch at 02/27/21 0723  . FLUoxetine (PROZAC) capsule 40 mg  40 mg Oral Daily Marianna Payment, MD   40 mg at 02/27/21 1036  . folic acid (FOLVITE) tablet 1 mg  1 mg Oral Daily  Marianna Payment, MD   1 mg at 02/27/21 1036  . hydrOXYzine (ATARAX/VISTARIL) tablet 25 mg  25 mg Oral Q6H PRN Marianna Payment, MD      . lamoTRIgine (LAMICTAL) tablet 100 mg  100 mg Oral BID Marianna Payment, MD   100 mg at 02/27/21 1036  . loperamide (IMODIUM) capsule 2-4 mg  2-4 mg Oral PRN Marianna Payment, MD      . LORazepam (ATIVAN) tablet 1-4 mg  1-4 mg Oral Q1H PRN Marianna Payment, MD   1 mg at 02/27/21 1332   Or  . LORazepam (ATIVAN) injection 1-4 mg  1-4 mg Intravenous Q1H PRN Marianna Payment, MD   4 mg at 02/26/21 2243  . multivitamin with minerals tablet 1 tablet  1 tablet Oral Daily Marianna Payment, MD   1 tablet at 02/27/21 1036  . nicotine (NICODERM CQ - dosed in mg/24 hours) patch 14 mg  14 mg Transdermal Daily Marianna Payment, MD   14 mg at 02/27/21 1039  . omega-3 acid ethyl esters (LOVAZA) capsule 1 g  1 g Oral QHS Marianna Payment, MD   1 g at 02/26/21 2221  . ondansetron (ZOFRAN) tablet 4 mg  4 mg Oral Q6H PRN Marianna Payment, MD       Or  . ondansetron [facility]) injection 4 mg  4 mg Intravenous Q6H PRN Marianna Payment, MD      . pantoprazole (PROTONIX) EC tablet 40 mg  40 mg Oral Daily PRN Marianna Payment, MD      . polyethylene glycol (MIRALAX / GLYCOLAX) packet 17 g  17 g Oral Daily PRN Marianna Payment, MD      . senna (SENOKOT) tablet 8.6 mg  1 tablet Oral QHS Marianna Payment, MD   8.6 mg at 02/26/21 2225  . sodium chloride flush (NS) 0.9 % injection 3 mL  3 mL Intravenous  Q12H Marianna Payment, MD   3 mL at 02/27/21 1039  . thiamine tablet 100 mg  100 mg Oral Daily Marianna Payment, MD   100 mg at 02/27/21 1036   Or  . thiamine (B-1) injection 100 mg  100 mg Intravenous Daily Marianna Payment, MD   100 mg at 02/26/21 2230   Current Outpatient Medications  Medication Sig Dispense Refill  . ALPRAZolam (XANAX) 0.5 MG tablet TAKE 1 TABLET BY MOUTH EVERY MORNING, AT NOON, EVERY EVENING and AT BEDTIME (Patient taking differently: Take 0.5 mg by mouth See admin instructions. Take 0.5 mg by mouth in the morning, at noontime, every evening, and at bedtime) 120 tablet 3  . amLODipine (NORVASC) 5 MG tablet Take 5 mg by mouth at bedtime.     . Cholecalciferol (VITAMIN D3) 10 MCG (400 UNIT) CAPS Take 400 Units by mouth at bedtime.    Marland Kitchen ezetimibe (ZETIA) 10 MG tablet Take 1 tablet (10 mg total) by mouth daily. (Patient taking differently: Take 10 mg by mouth at bedtime.) 30 tablet 3  . fentaNYL (DURAGESIC - DOSED MCG/HR) 50 MCG/HR Apply 50 mcg/hr topically every 3 (three) days.    Marland Kitchen FLUoxetine (PROZAC) 40 MG capsule Take 1 capsule (40 mg total) by mouth daily. 30 capsule 3  . ibuprofen (ADVIL) 200 MG tablet Take 400-600 mg by mouth every 6 (six) hours as needed for mild pain or headache.    . lamoTRIgine (LAMICTAL) 100 MG tablet Take 1 tablet (100 mg total) by mouth 2 (two) times daily. 60 tablet 3  . nicotine polacrilex (NICORETTE) 4 MG gum Take 4 mg by  mouth as needed (for any urge to smoke).    Marland Kitchen omega-3 acid ethyl esters (LOVAZA) 1 g capsule Take 1 capsule (1 g total) by mouth 2 (two) times daily. (Patient taking differently: Take 1 g by mouth at bedtime.) 60 capsule 1  . pantoprazole (PROTONIX) 40 MG tablet Take 40 mg by mouth daily as needed (for reflux).    Marland Kitchen aspirin EC 325 MG tablet Take 1 tablet (325 mg total) by mouth daily. (Patient not taking: No sig reported) 30 tablet 3  . baclofen (LIORESAL) 10 MG tablet Take 10 mg by mouth daily. (Patient not taking: Reported on 02/26/2021)     . nicotine (NICODERM CQ - DOSED IN MG/24 HOURS) 14 mg/24hr patch Place 1 patch (14 mg total) onto the skin daily. (Patient not taking: No sig reported) 28 patch 0  . promethazine-dextromethorphan (PROMETHAZINE-DM) 6.25-15 MG/5ML syrup Take 5 mLs by mouth 4 (four) times daily as needed for cough. (Patient not taking: No sig reported) 118 mL 0    Musculoskeletal: Strength & Muscle Tone: within normal limits Gait & Station: remains in bed concern that patient is unsteady as he has tremors on exam Patient leans: N/A            Psychiatric Specialty Exam:  Presentation  General Appearance: Disheveled  Eye Contact:Fleeting  Speech:Slurred  Speech Volume:Decreased  Handedness:No data recorded  Mood and Affect  Mood:Anxious  Affect:Congruent   Thought Process  Thought Processes:Irrevelant  Descriptions of Associations:Circumstantial  Orientation:-- (oriented to person not time, place, somewhat oriented to situation)  Thought Content:Scattered  History of Schizophrenia/Schizoaffective disorder:No data recorded Duration of Psychotic Symptoms:No data recorded Hallucinations:Hallucinations: Auditory  Ideas of Reference:None  Suicidal Thoughts:Suicidal Thoughts: No  Homicidal Thoughts:Homicidal Thoughts: No   Sensorium  Memory:Immediate Poor; Recent Poor; Remote Poor  Judgment:Impaired  Insight:None   Executive Functions  Concentration:Fair  Attention Span:Poor  [place] of Knowledge:Poor  Language:Fair   Psychomotor Activity  Psychomotor Activity:Psychomotor Activity: Restlessness   Assets  Assets:Resilience   Sleep  Sleep:Sleep: Poor   Physical Exam: Physical Exam HENT:     Head: Normocephalic and atraumatic.  Eyes:     Extraocular Movements: Extraocular movements intact.     Conjunctiva/sclera: Conjunctivae normal.  Cardiovascular:     Rate and Rhythm: Normal rate.  Pulmonary:     Effort: Pulmonary effort is  normal.     Breath sounds: Normal breath sounds.  Abdominal:     General: Abdomen is flat.  Musculoskeletal:        General: Normal range of motion.     Cervical back: Normal range of motion.  Skin:    General: Skin is warm and dry.  Neurological:     Mental Status: He is alert. He is disoriented.    Review of Systems  Constitutional: Negative for chills and fever.  HENT: Negative for hearing loss.   Eyes: Negative for blurred vision.  Respiratory: Negative for cough and wheezing.   Cardiovascular: Negative for chest pain.  Gastrointestinal: Negative for abdominal pain.  Skin: Negative for rash.  Neurological: Negative for dizziness.  Psychiatric/Behavioral: Negative for suicidal ideas.   Blood pressure 131/79, pulse 82, temperature 97.6 F (36.4 C), temperature source Axillary, resp. rate 19, height 5\' 10"  (1.778 m), weight 99.8 kg, SpO2 96 %. Body mass index is 31.57 kg/m.  Treatment Plan Summary: Daily contact with patient to assess and evaluate symptoms and progress in treatment   Recommendations Benzodiazepine withdrawal Patient reports significant Xanax intake with sudden discontinuation.  Patient has a hx of Benzo use disorder. Patient PDMP notes that patient filled a 30 days worth of Xanax 0.5mg  QID PRN 01/28/2021. If patient truly ran out his initial report of taking 10-12 pills/ day makes sense and matches with his report of running out almost 2 weeks ago. Unsure if patient was buying Xanax off the street for a few more days, but patient UDS was negative on admission suggesting patient is in withdrawal as he has not had the medication in at least 2-3 days.  - Agree w/ Primary team's current tx plan of Librium taper - Once patient is stable do not restart Xanax  Anxiety Patient has a hx of Benzo use disorder and is currently in withdrawal after reported abuse of the medication. Once patient is medically stable recommend Atarax 25mg  TID PRN for anxiety and follow up w/ OP  Psych.  - Recommend Atarax 25mg  TID PRN  Bipolar disorder Patient reports compliance but at this time he appears to be a poor historian. Will get Lamictal level to make sure it is appropriate. - Continue lamictal 100mg  BID - Continue Prozac 40mg  daily - Lamictal level pending   Disposition: Per primary team. Per psychiatry patient does not meet inpatient criteria at this time. Will reassess patient in AM.  PGY-1 Freida Busman, MD 02/27/2021 1:43 PM

## 2021-02-27 NOTE — ED Notes (Signed)
Pt O2 dropped down into the 70s on room air while sleeping. Pt placed on 3L [place] and O2 100% at this time.

## 2021-02-28 ENCOUNTER — Telehealth (HOSPITAL_COMMUNITY): Payer: PPO | Admitting: Psychiatry

## 2021-02-28 LAB — CBC WITH DIFFERENTIAL/PLATELET
Abs Immature Granulocytes: 0.02 10*3/uL (ref 0.00–0.07)
Basophils Absolute: 0.1 10*3/uL (ref 0.0–0.1)
Basophils Relative: 1 %
Eosinophils Absolute: 0.4 10*3/uL (ref 0.0–0.5)
Eosinophils Relative: 4 %
HCT: 38.7 % — ABNORMAL LOW (ref 39.0–52.0)
Hemoglobin: 13.1 g/dL (ref 13.0–17.0)
Immature Granulocytes: 0 %
Lymphocytes Relative: 27 %
Lymphs Abs: 2.4 10*3/uL (ref 0.7–4.0)
MCH: 29 pg (ref 26.0–34.0)
MCHC: 33.9 g/dL (ref 30.0–36.0)
MCV: 85.6 fL (ref 80.0–100.0)
Monocytes Absolute: 0.7 10*3/uL (ref 0.1–1.0)
Monocytes Relative: 7 %
Neutro Abs: 5.6 10*3/uL (ref 1.7–7.7)
Neutrophils Relative %: 61 %
Platelets: 209 10*3/uL (ref 150–400)
RBC: 4.52 MIL/uL (ref 4.22–5.81)
RDW: 13.2 % (ref 11.5–15.5)
WBC: 9 10*3/uL (ref 4.0–10.5)
nRBC: 0 % (ref 0.0–0.2)

## 2021-02-28 LAB — LIPID PANEL
Cholesterol: 129 mg/dL (ref 0–200)
HDL: 38 mg/dL — ABNORMAL LOW (ref >40)
LDL Cholesterol: 66 mg/dL (ref 0–99)
Total CHOL/HDL Ratio: 3.4 RATIO
Triglycerides: 126 mg/dL (ref <150)
VLDL: 25 mg/dL (ref 0–40)

## 2021-02-28 LAB — BASIC METABOLIC PANEL
Anion gap: 6 (ref 5–15)
BUN: 9 mg/dL (ref 8–23)
CO2: 25 mmol/L (ref 22–32)
Calcium: 8.7 mg/dL — ABNORMAL LOW (ref 8.9–10.3)
Chloride: 105 mmol/L (ref 98–111)
Creatinine, Ser: 0.77 mg/dL (ref 0.61–1.24)
GFR, Estimated: >60 mL/min (ref >60)
Glucose, Bld: 103 mg/dL — ABNORMAL HIGH (ref 70–99)
Potassium: 3.3 mmol/L — ABNORMAL LOW (ref 3.5–5.1)
Sodium: 136 mmol/L (ref 135–145)

## 2021-02-28 LAB — GLUCOSE, CAPILLARY: Glucose-Capillary: 107 mg/dL — ABNORMAL HIGH (ref 70–99)

## 2021-02-28 LAB — LAMOTRIGINE LEVEL: Lamotrigine Lvl: 3.6 ug/mL (ref 2.0–20.0)

## 2021-02-28 MED ORDER — POTASSIUM CHLORIDE CRYS ER 20 MEQ PO TBCR
40.0000 meq | EXTENDED_RELEASE_TABLET | Freq: Two times a day (BID) | ORAL | Status: AC
  Administered 2021-02-28 (×2): 40 meq via ORAL
  Filled 2021-02-28 (×2): qty 2

## 2021-02-28 MED ORDER — CHLORDIAZEPOXIDE HCL 25 MG PO CAPS
25.0000 mg | ORAL_CAPSULE | Freq: Two times a day (BID) | ORAL | Status: AC
  Administered 2021-02-28 – 2021-03-01 (×2): 25 mg via ORAL
  Filled 2021-02-28 (×2): qty 1

## 2021-02-28 MED ORDER — POTASSIUM CHLORIDE CRYS ER 20 MEQ PO TBCR: 40.0000 meq | EXTENDED_RELEASE_TABLET | Freq: Two times a day (BID) | ORAL | Status: DC

## 2021-02-28 NOTE — Progress Notes (Signed)
Patient noted a/ox4, denied any pain or distress, VSS, no tremors or confusion noted, up in chair, family at bedside. Will continue to monitor.

## 2021-02-28 NOTE — Evaluation (Signed)
Occupational Therapy Evaluation Patient Details Name: Stephen Porter MRN: [id number] DOB: [birth year] Today's Date: 02/28/2021    History of Present Illness Pt is a 65 y/o male admitted secondary to difficulty walking and AMS. Thought to have toxic metabolic encephalopathy secondary to Benzodiazepine Withdrawal. PMH includesbipolar disorder, DDD, chron's and tobacco use.   Clinical Impression   Pt PTA: Pt was independent with ADL and mobility. Pt currently, minguardA for mobility and supervisionA for ADL tasks. Pt cognitively, A/Ox4. Pt passing a perfect score with no corrections in a distracting environment while walking in hallway for Short Blessed Test resulting "normal cognition."  Pt does not require continued OT skilled services as pt has 24/7 support at home from spouse. OT D/C at this time.     Follow Up Recommendations  No OT follow up;Supervision - Intermittent    Equipment Recommendations  None recommended by OT    Recommendations for Other Services       Precautions / Restrictions Precautions Precautions: Fall Restrictions Weight Bearing Restrictions: No      Mobility Bed Mobility Overal bed mobility: Needs Assistance Bed Mobility: Supine to Sit;Sit to Supine     Supine to sit: Supervision Sit to supine: Supervision   General bed mobility comments: no physical assist; increased time and use of rail    Transfers Overall transfer level: Needs assistance Equipment used: 1 person hand held assist Transfers: Sit to/from Stand;Stand Pivot Transfers Sit to Stand: Min guard Stand pivot transfers: Min guard       General transfer comment: slight instability with initial stance,    Balance Overall balance assessment: Needs assistance Sitting-balance support: No upper extremity supported;Feet supported Sitting balance-Leahy Scale: Fair     Standing balance support: Single extremity supported Standing balance-Leahy Scale: Poor Standing balance comment: able  to perform grooming tasks in standing, but reliant on single UE support                           ADL either performed or assessed with clinical judgement   ADL Overall ADL's : Modified independent;At baseline                                     Functional mobility during ADLs: Supervision/safety General ADL Comments: Pt performing OOB ADL with modified independence for tasks and for mobility in hallway, [place]. Pt's spouse in room and appears supportive. Pt tolerating session well.     Vision Baseline Vision/History: No visual deficits Vision Assessment?: No apparent visual deficits     Perception     Praxis      Pertinent Vitals/Pain Pain Assessment: No/denies pain     Hand Dominance Right   Extremity/Trunk Assessment Upper Extremity Assessment Upper Extremity Assessment: Overall WFL for tasks assessed   Lower Extremity Assessment Lower Extremity Assessment: Overall WFL for tasks assessed   Cervical / Trunk Assessment Cervical / Trunk Assessment: Normal   Communication Communication Communication: No difficulties   Cognition Arousal/Alertness: Awake/alert Behavior During Therapy: WFL for tasks assessed/performed Overall Cognitive Status: Within Functional Limits for tasks assessed                                 General Comments: Pt A/Ox4; pt aware of situation. Pt passing a perfect score with no corrections in a distracting environment while walking in hallway  for Short Blessed Test resulting "normal cognition." SBT is just a screen so further cognitive testing may be indicated.   General Comments  Spouse in room.    Exercises     Shoulder Instructions      Home Living Family/patient expects to be discharged to:: Private residence Living Arrangements: Spouse/significant other Available Help at Discharge: Family;Available 24 hours/day Type of Home: House Home Access: Level entry     Home Layout: One level      Bathroom Shower/Tub: Occupational psychologist: Handicapped height Bathroom Accessibility: Yes   Home Equipment: Shower seat;Cane - single point;Walker - 2 wheels;Bedside commode          Prior Functioning/Environment Level of Independence: Independent                 OT Problem List: Impaired balance (sitting and/or standing)      OT Treatment/Interventions:      OT Goals(Current goals can be found in the care plan section) Acute Rehab OT Goals Patient Stated Goal: to eat lunch OT Goal Formulation: All assessment and education complete, DC therapy Potential to Achieve Goals: Good  OT Frequency:     Barriers to D/C:            Co-evaluation              AM-PAC OT "6 Clicks" Daily Activity     Outcome Measure Help from another person eating meals?: None Help from another person taking care of personal grooming?: A Little Help from another person toileting, which includes using toliet, bedpan, or urinal?: A Little Help from another person bathing (including washing, rinsing, drying)?: A Little Help from another person to put on and taking off regular upper body clothing?: None Help from another person to put on and taking off regular lower body clothing?: None 6 Click Score: 21   End of Session Equipment Utilized During Treatment: Gait belt Nurse Communication: Mobility status  Activity Tolerance: Patient tolerated treatment well Patient left: in chair;with call bell/phone within reach;with family/visitor present  OT Visit Diagnosis: Unsteadiness on feet (R26.81)                Time: 4665-9935 OT Time Calculation (min): 24 min Charges:  OT General Charges $OT Visit: 1 Visit OT Evaluation $OT Eval Moderate Complexity: 1 Mod OT Treatments $Cognitive Funtion inital: Initial 15 mins  Jefferey Pica, OTR/L Acute Rehabilitation Services Pager: [phone] Office: [phone]   Ranessa Kosta  C 02/28/2021, 2:35 PM

## 2021-02-28 NOTE — Progress Notes (Signed)
HD#1 Subjective:  Overnight Events: Patient received Ativan at 21:00 for CIWA of 7.   Mr. Stephen Porter reports he is feeling much better today. He has been able to move around more and walk with help. He is still feeling shaky, but improved.   He is in agreement with plan to switch to Atarax for anxiety.   Objective:  Vital signs in last 24 hours: Vitals:   02/28/21 0800 02/28/21 0900 02/28/21 1000 02/28/21 1126  BP:    (!) 141/92  Pulse: 74 73 85 71  Resp: 17 18 10 18   Temp:    98 F (36.7 C)  TempSrc:    Oral  SpO2: 98% 95% 98% 94%  Weight:      Height:       Physical Exam:  General: Elderly male, sitting up in chair, NAD. CV: normal rate and regular rhythm, no m/r/g. Pulm: CTABL, no adventitious sounds noted. Normal work of breathing. Abdomen: soft, nontender, nondistended, normoactive bowel sounds. Neuro: AAOx4, coherent speech, no tremors noted during exam. Skin: warm and dry Psych: calm and cooperative.  Pertinent Labs: CBC Latest Ref Rng & Units 02/28/2021 02/27/2021 02/26/2021  WBC 4.0 - 10.5 K/uL 9.0 9.4 9.4  Hemoglobin 13.0 - 17.0 g/dL 13.1 12.9(L) 15.5  Hematocrit 39.0 - 52.0 % 38.7(L) 38.7(L) 46.5  Platelets 150 - 400 K/uL 209 222 273    CMP Latest Ref Rng & Units 02/28/2021 02/27/2021 02/26/2021  Glucose 70 - 99 mg/dL 103(H) 98 -  BUN 8 - 23 mg/dL 9 9 -  Creatinine 0.61 - 1.24 mg/dL 0.77 0.73 0.75  Sodium 135 - 145 mmol/L 136 140 -  Potassium 3.5 - 5.1 mmol/L 3.3(L) 3.6 -  Chloride 98 - 111 mmol/L 105 108 -  CO2 22 - 32 mmol/L 25 24 -  Calcium 8.9 - 10.3 mg/dL 8.7(L) 9.0 -  Total Protein 6.5 - 8.1 g/dL - - -  Total Bilirubin 0.3 - 1.2 mg/dL - - -  Alkaline Phos 38 - 126 U/L - - -  AST 15 - 41 U/L - - -  ALT 0 - 44 U/L - - -    Imaging: No results found.  Assessment/Plan:   Active Problems:   Toxic encephalopathy   Patient Summary: Stephen Porter is a 55 y.o. with a pertinent PMH of bipolar 1 disorder, tobacco use disorder, opioid use disorder,  degenerative disc disease, hypertension, who presented with agitation and tremors and admitted for benzodiazepine withdrawal.   Polysubstance use disorder Benzodiazepine withdrawal Overall, appears significantly improved from yesterday. Received 1mg  of ativan for CIWA of 7. Will discontinue ativan today as patient clinically appears much better. Will taper librium to BID dosing today. Anticipate that he will be medically stable for discharge tomorrow and will complete librium taper tomorrow as well. -Continue Librium taper -CIWA monitoring without ativan -PT recommending HH PT  Hypokalemia K 3.3 this AM, repleted with PO KCl.  Bipolar 1 disorder Anxiety Psychiatry consulted, appreciate assistance. Recommend outpatient psych follow up. Continue current medications and add PO atarax for anxiety. Patient will not require inpatient psych admission. -Continue fluoxetine and lamotrigine -f/u lamotrigine level -PO atarax 25mg  TID prn for anxeity  HTN HLD BP controlled with norvasc 5mg . Lipid panel with LDL 66. -monitor daily vitals, titrate up norvasc if needed. -continue norvasc -continue zetia 10mg  daily  Chronic back pain w/ opioid dependence Degenerative disc disease Doing well on tylenol 650mg  q6h prn and fentanyl 22mcg/hr patch q72h. -continue tylenol and fentanyl  patch   Anticipated discharge in 0-1 days.   Virl Axe, MD 02/28/2021, 11:58 AM Pager: [phone]

## 2021-02-28 NOTE — Consult Note (Signed)
[facility] Consult   Reason for Consult:  Medication recc's, Hx of Bipolar disorder and Anxiety Referring Physician:  Aldine Contes, MD Patient Identification: Stephen Porter MRN:  [id number] Principal Diagnosis: <principal problem not specified> Diagnosis:  Active Problems:   Toxic encephalopathy   Total Time spent with patient: 30 minutes  Subjective:   Stephen Porter is a 65 y.o. male patient admitted with  Benzodiazapine withdrawal who has a hx of bipolar I disorder, anxiety, Benzo use disorder, Opioid use disorder (oxycodone), and distant Substance use disorder, TIA, Carotid Artery Stenosis, HLD, TUD, DDD and Crohn's Disease. Per EMR patient was prescribed Xanax 0.5mg  QID PRN for anxiety. Per Primary team the patient endorsed that he was taking 10-12 pills per day and he had stopped taking Xanax 6 days prior to presentation.  HPI:   On assessment this AM patient is significantly improved. Patient had no behavior issues overnight and did not require the previously ordered restraints.   Patient is alert and oriented to person, place, time, and situation this AM. Patient's wife later comes to patient's bedside. Patient confirms that he has gone approx 2 weeks without benzo's. Patient reports he was trying to stop himself and he felt fine until last Friday. Patient reports that he had accidentally been abusing the medication "I just kept taking them because I was anxious and suddenly I saw I had way less." Patient reports that he has been having severe anxiety for a few weeks and endorses that his OP Psychiatrist increased Prozac approx 4-6 weeks ago due to his anxiety; however patient had also been taking increased Xanax at the time. Patient reports he is not sure if he truly has Bipolar disorder and denies a hx of manic episodes which his wife corroborates. On assessment this AM patient does not endorse SI, HI, nor AVH. Patient makes it very clear he does not wish to be  prescribed Benzo's again and notes that this withdrawal is worse than the one in the past. Patient reports that he was told that the substance is not addicting, but he understands that he a tendency to accidentally abuse pills.   Past Psychiatric History: Bipolar disorder, Anxiety, Benzodiazepine use disorder, and Opioid use disorder (oxycodone)  Risk to Self:   NO Risk to Others:  NO Prior Inpatient Therapy:   Detox at [facility] and 2013 @ [facility]: Prior medications include Cymbalta Prior Outpatient Therapy:   YES, Dr. Levonne Spiller, current  Past Medical History:  Past Medical History:  Diagnosis Date  . Bipolar disorder ([place])   . DDD (degenerative disc disease)   . Depression   . Hyperlipidemia   . Insomnia   . Jejunal ulcer   . Leukocytosis 04/12/2016  . Skin rash     Past Surgical History:  Procedure Laterality Date  . BACK SURGERY    . CERVICAL SPINE SURGERY     x3  . CHOLECYSTECTOMY    . COLONOSCOPY  2006  . COLONOSCOPY N/A 06/30/2014   Procedure: COLONOSCOPY;  Surgeon: Rogene Houston, MD;  Location: [facility];  Service: Endoscopy;  Laterality: N/A;  830  . LUMBAR SPINE SURGERY    . UPPER GASTROINTESTINAL ENDOSCOPY     Family History:  Family History  Problem Relation Age of Onset  . Hypertension Sister   . Anxiety disorder Sister   . Hypertension Brother   . Hypertension Brother   . Hypertension Brother   . Dementia Mother   . Paranoid behavior Mother   .  Dementia Father   . Alcohol abuse Paternal Uncle   . Drug abuse Paternal Uncle   . ADD / ADHD Neg Hx   . Depression Neg Hx   . OCD Neg Hx   . Schizophrenia Neg Hx   . Seizures Neg Hx   . Sexual abuse Neg Hx   . Physical abuse Neg Hx   . Colon cancer Neg Hx    Family Psychiatric  History: Per above, Uncles had EtoH use disorder Social History:  Social History   Substance and Sexual Activity  Alcohol Use No   Comment: 07-05-2016 per pt no     Social History   Substance and Sexual Activity   Drug Use No   Comment: 07-05-2016 per pt no and he stopped Marijuana 1 yr ago    Social History   Socioeconomic History  . Marital status: Married    Spouse name: Not on file  . Number of children: Not on file  . Years of education: Not on file  . Highest education level: Not on file  Occupational History  . Occupation: Scientist, research (life sciences): UNEMPLOYED  Tobacco Use  . Smoking status: Former Smoker    Packs/day: 1.00    Years: 40.00    Pack years: 40.00    Types: Cigarettes  . Smokeless tobacco: Current User  . Tobacco comment: 07-05-2016 Dip, 01-22-2017 per pt he stopped Sep 25, 2016  Vaping Use  . Vaping Use: Never used  Substance and Sexual Activity  . Alcohol use: No    Comment: 07-05-2016 per pt no  . Drug use: No    Comment: 07-05-2016 per pt no and he stopped Marijuana 1 yr ago  . Sexual activity: Not on file  Other Topics Concern  . Not on file  Social History Narrative   Married with 3 children and lives locally   No regular exercise   Social Determinants of Health   Financial Resource Strain: Not on file  Food Insecurity: Not on file  Transportation Needs: Not on file  Physical Activity: Not on file  Stress: Not on file  Social Connections: Not on file   Additional Social History:    Allergies:   Allergies  Allergen Reactions  . Elavil [Amitriptyline] Other (See Comments)    Made the patient "shaky"    Labs:  Results for orders placed or performed during the hospital encounter of 02/26/21 (from the past 48 hour(s))  CBG monitoring, ED     Status: Abnormal   Collection Time: 02/26/21 11:52 AM  Result Value Ref Range   Glucose-Capillary 130 (H) 70 - 99 mg/dL    Comment: Glucose reference range applies only to samples taken after fasting for at least 8 hours.  Resp Panel by RT-PCR (Flu A&B, Covid) Nasopharyngeal Swab     Status: None   Collection Time: 02/26/21 12:03 PM   Specimen: Nasopharyngeal Swab; Nasopharyngeal(NP) swabs in vial transport  medium  Result Value Ref Range   SARS Coronavirus 2 by RT PCR NEGATIVE NEGATIVE    Comment: (NOTE) SARS-CoV-2 target nucleic acids are NOT DETECTED.  The SARS-CoV-2 RNA is generally detectable in upper respiratory specimens during the acute phase of infection. The lowest concentration of SARS-CoV-2 viral copies this assay can detect is 138 copies/mL. A negative result does not preclude SARS-Cov-2 infection and should not be used as the sole basis for treatment or other patient management decisions. A negative result may occur with  improper specimen collection/handling, submission of specimen other  than nasopharyngeal swab, presence of viral mutation(s) within the areas targeted by this assay, and inadequate number of viral copies(<138 copies/mL). A negative result must be combined with clinical observations, patient history, and epidemiological information. The expected result is Negative.  Fact Sheet for Patients:  EntrepreneurPulse.com.au  Fact Sheet for Healthcare Providers:  IncredibleEmployment.be  This test is no t yet approved or cleared by the [place] FDA and  has been authorized for detection and/or diagnosis of SARS-CoV-2 by FDA under an Emergency Use Authorization (EUA). This EUA will remain  in effect (meaning this test can be used) for the duration of the COVID-19 declaration under Section 564(b)(1) of the Act, 21 U.S.C.section 360bbb-3(b)(1), unless the authorization is terminated  or revoked sooner.       Influenza A by PCR NEGATIVE NEGATIVE   Influenza B by PCR NEGATIVE NEGATIVE    Comment: (NOTE) The Xpert Xpress SARS-CoV-2/FLU/RSV plus assay is intended as an aid in the diagnosis of influenza from Nasopharyngeal swab specimens and should not be used as a sole basis for treatment. Nasal washings and aspirates are unacceptable for Xpert Xpress SARS-CoV-2/FLU/RSV testing.  Fact Sheet for  Patients: EntrepreneurPulse.com.au  Fact Sheet for Healthcare Providers: IncredibleEmployment.be  This test is not yet approved or cleared by the [place] FDA and has been authorized for detection and/or diagnosis of SARS-CoV-2 by FDA under an Emergency Use Authorization (EUA). This EUA will remain in effect (meaning this test can be used) for the duration of the COVID-19 declaration under Section 564(b)(1) of the Act, 21 U.S.C. section 360bbb-3(b)(1), unless the authorization is terminated or revoked.  Performed at [facility], [address] [place], [place] [address]   Ethanol     Status: None   Collection Time: 02/26/21 12:03 PM  Result Value Ref Range   Alcohol, Ethyl (B) <10 <10 mg/dL    Comment: (NOTE) Lowest detectable limit for serum alcohol is 10 mg/dL.  For medical purposes only. Performed at [facility], [address] [place], [place] [address]   Protime-INR     Status: None   Collection Time: 02/26/21 12:03 PM  Result Value Ref Range   Prothrombin Time 13.5 11.4 - 15.2 seconds   INR 1.0 0.8 - 1.2    Comment: (NOTE) INR goal varies based on device and disease states. Performed at [facility], [address], [place], [place] [address]   APTT     Status: None   Collection Time: 02/26/21 12:03 PM  Result Value Ref Range   aPTT 32 24 - 36 seconds    Comment: Performed at [facility], [address] [place], [place] [address]  CBC     Status: None   Collection Time: 02/26/21 12:03 PM  Result Value Ref Range   WBC 9.4 4.0 - 10.5 K/uL   RBC 5.39 4.22 - 5.81 MIL/uL   Hemoglobin 15.5 13.0 - 17.0 g/dL   HCT 46.5 39.0 - 52.0 %   MCV 86.3 80.0 - 100.0 fL   MCH 28.8 26.0 - 34.0 pg   MCHC 33.3 30.0 - 36.0 g/dL   RDW 13.4 11.5 - 15.5 %   Platelets 273 150 - 400 K/uL   nRBC 0.0 0.0 - 0.2 %    Comment: Performed at [facility], [address], [place], [place] [address]  Differential     Status: None   Collection Time:  02/26/21 12:03 PM  Result Value Ref Range   Neutrophils Relative % 75 %   Neutro Abs  7.0 1.7 - 7.7 K/uL   Lymphocytes Relative 18 %   Lymphs Abs 1.7 0.7 - 4.0 K/uL   Monocytes Relative 7 %   Monocytes Absolute 0.6 0.1 - 1.0 K/uL   Eosinophils Relative 0 %   Eosinophils Absolute 0.0 0.0 - 0.5 K/uL   Basophils Relative 0 %   Basophils Absolute 0.0 0.0 - 0.1 K/uL   Immature Granulocytes 0 %   Abs Immature Granulocytes 0.02 0.00 - 0.07 K/uL    Comment: Performed at [facility], [address] [place], [place] [address]  Comprehensive metabolic panel     Status: Abnormal   Collection Time: 02/26/21 12:03 PM  Result Value Ref Range   Sodium 139 135 - 145 mmol/L   Potassium 3.2 (L) 3.5 - 5.1 mmol/L   Chloride 104 98 - 111 mmol/L   CO2 24 22 - 32 mmol/L   Glucose, Bld 123 (H) 70 - 99 mg/dL    Comment: Glucose reference range applies only to samples taken after fasting for at least 8 hours.   BUN 10 8 - 23 mg/dL   Creatinine, Ser 0.78 0.61 - 1.24 mg/dL   Calcium 9.5 8.9 - 10.3 mg/dL   Total Protein 7.7 6.5 - 8.1 g/dL   Albumin 4.6 3.5 - 5.0 g/dL   AST 30 15 - 41 U/L   ALT 26 0 - 44 U/L   Alkaline Phosphatase 77 38 - 126 U/L   Total Bilirubin 1.1 0.3 - 1.2 mg/dL   GFR, Estimated >60 >60 mL/min    Comment: (NOTE) Calculated using the CKD-EPI Creatinine Equation (2021)    Anion gap 11 5 - 15    Comment: Performed at [facility], [address], [place], [place] [address]  Sedimentation rate     Status: None   Collection Time: 02/26/21 12:03 PM  Result Value Ref Range   Sed Rate 15 0 - 16 mm/hr    Comment: Performed at [facility], [address], [place], [place] [address]  C-reactive protein     Status: None   Collection Time: 02/26/21 12:05 PM  Result Value Ref Range   CRP 0.6 <1.0 mg/dL    Comment: Performed at [facility], [address], [place], [place] [address]  Culture, blood (routine x 2)     Status: None (Preliminary result)   Collection Time: 02/26/21 12:18 PM    Specimen: Right Antecubital; Blood  Result Value Ref Range   Specimen Description      RIGHT ANTECUBITAL BOTTLES DRAWN AEROBIC AND ANAEROBIC   Special Requests Blood Culture adequate volume    Culture      NO GROWTH 2 DAYS Performed at [facility], [address], [place], [place] [address]    Report Status PENDING   Culture, blood (routine x 2)     Status: None (Preliminary result)   Collection Time: 02/26/21  1:23 PM   Specimen: BLOOD LEFT HAND  Result Value Ref Range   Specimen Description      BLOOD LEFT HAND BOTTLES DRAWN AEROBIC AND ANAEROBIC   Special Requests Blood Culture adequate volume    Culture      NO GROWTH 2 DAYS Performed at [facility], [address] [place], [place] [address]    Report Status PENDING   Urine rapid drug screen (hosp performed)     Status: None   Collection Time: 02/26/21  2:00 PM  Result Value Ref Range   Opiates NONE DETECTED NONE DETECTED   Cocaine NONE  DETECTED NONE DETECTED   Benzodiazepines NONE DETECTED NONE DETECTED   Amphetamines NONE DETECTED NONE DETECTED   Tetrahydrocannabinol NONE DETECTED NONE DETECTED   Barbiturates NONE DETECTED NONE DETECTED    Comment: (NOTE) DRUG SCREEN FOR MEDICAL PURPOSES ONLY.  IF CONFIRMATION IS NEEDED FOR ANY PURPOSE, NOTIFY LAB WITHIN 5 DAYS.  LOWEST DETECTABLE LIMITS FOR URINE DRUG SCREEN Drug Class                     Cutoff (ng/mL) Amphetamine and metabolites    1000 Barbiturate and metabolites    200 Benzodiazepine                 440 Tricyclics and metabolites     300 Opiates and metabolites        300 Cocaine and metabolites        300 THC                            50 Performed at [facility], [address] [place], [place] [address]   Urinalysis, Routine w reflex microscopic     Status: Abnormal   Collection Time: 02/26/21  2:00 PM  Result Value Ref Range   Color, Urine YELLOW YELLOW   APPearance CLEAR CLEAR   Specific Gravity, Urine 1.044 (H) 1.005 - 1.030   pH 7.0 5.0 - 8.0    Glucose, UA NEGATIVE NEGATIVE mg/dL   Hgb urine dipstick NEGATIVE NEGATIVE   Bilirubin Urine NEGATIVE NEGATIVE   Ketones, ur 20 (A) NEGATIVE mg/dL   Protein, ur NEGATIVE NEGATIVE mg/dL   Nitrite NEGATIVE NEGATIVE   Leukocytes,Ua NEGATIVE NEGATIVE    Comment: Performed at [facility], [address] [place], [place] [address]  HIV Antibody (routine testing w rflx)     Status: None   Collection Time: 02/26/21  9:23 PM  Result Value Ref Range   HIV Screen 4th Generation wRfx Non Reactive Non Reactive    Comment: Performed at [facility], [place] [address], [place], [place] [address]  Creatinine, serum     Status: None   Collection Time: 02/26/21  9:23 PM  Result Value Ref Range   Creatinine, Ser 0.75 0.61 - 1.24 mg/dL   GFR, Estimated >60 >60 mL/min    Comment: (NOTE) Calculated using the CKD-EPI Creatinine Equation (2021) Performed at [facility] [address] [place], [place] [address]   Magnesium     Status: None   Collection Time: 02/26/21  9:23 PM  Result Value Ref Range   Magnesium 2.0 1.7 - 2.4 mg/dL    Comment: Performed at [facility], [place] [address] [place], [place] [address]  Phosphorus     Status: None   Collection Time: 02/26/21  9:23 PM  Result Value Ref Range   Phosphorus 3.2 2.5 - 4.6 mg/dL    Comment: Performed at [facility], [place] [address] [place], [place] [address]  TSH     Status: None   Collection Time: 02/26/21  9:26 PM  Result Value Ref Range   TSH 1.082 0.350 - 4.500 uIU/mL    Comment: Performed by a 3rd Generation assay with a functional sensitivity of <=0.01 uIU/mL. Performed at [facility], [place] [address], [place], [place] [address]   Hemoglobin A1c     Status: None   Collection Time: 02/26/21  9:26 PM  Result Value Ref Range   Hgb A1c  MFr Bld 5.5 4.8 - 5.6 %    Comment: (NOTE)         Prediabetes: 5.7 - 6.4         Diabetes: >6.4         Glycemic control for adults with diabetes: <7.0    Mean Plasma  Glucose 111 mg/dL    Comment: (NOTE) Performed At: [facility] [place], [place] [id number] Rush Farmer MD [id number]   Basic metabolic panel     Status: None   Collection Time: 02/27/21  5:00 AM  Result Value Ref Range   Sodium 140 135 - 145 mmol/L   Potassium 3.6 3.5 - 5.1 mmol/L   Chloride 108 98 - 111 mmol/L   CO2 24 22 - 32 mmol/L   Glucose, Bld 98 70 - 99 mg/dL    Comment: Glucose reference range applies only to samples taken after fasting for at least 8 hours.   BUN 9 8 - 23 mg/dL   Creatinine, Ser 0.73 0.61 - 1.24 mg/dL   Calcium 9.0 8.9 - 10.3 mg/dL   GFR, Estimated >60 >60 mL/min    Comment: (NOTE) Calculated using the CKD-EPI Creatinine Equation (2021)    Anion gap 8 5 - 15    Comment: Performed at [place] [address], [place], [place] [address]  CBC     Status: Abnormal   Collection Time: 02/27/21  5:00 AM  Result Value Ref Range   WBC 9.4 4.0 - 10.5 K/uL   RBC 4.44 4.22 - 5.81 MIL/uL   Hemoglobin 12.9 (L) 13.0 - 17.0 g/dL   HCT 38.7 (L) 39.0 - 52.0 %   MCV 87.2 80.0 - 100.0 fL   MCH 29.1 26.0 - 34.0 pg   MCHC 33.3 30.0 - 36.0 g/dL   RDW 13.3 11.5 - 15.5 %   Platelets 222 150 - 400 K/uL   nRBC 0.0 0.0 - 0.2 %    Comment: Performed at [facility], [place] [address] [place], [place] [address]  Lipid panel     Status: Abnormal   Collection Time: 02/28/21  4:11 AM  Result Value Ref Range   Cholesterol 129 0 - 200 mg/dL   Triglycerides 126 <150 mg/dL   HDL 38 (L) >40 mg/dL   Total CHOL/HDL Ratio 3.4 RATIO   VLDL 25 0 - 40 mg/dL   LDL Cholesterol 66 0 - 99 mg/dL    Comment:        Total Cholesterol/HDL:CHD Risk Coronary Heart Disease Risk Table                     Men   Women  1/2 Average Risk   3.4   3.3  Average Risk       5.0   4.4  2 X Average Risk   9.6   7.1  3 X Average Risk  23.4   11.0        Use the calculated Patient Ratio above and the CHD Risk Table to determine the patient's CHD Risk.         ATP III CLASSIFICATION (LDL):  <100     mg/dL   Optimal  100-129  mg/dL   Near or Above                    Optimal  130-159  mg/dL   Borderline  160-189  mg/dL   High  >190  mg/dL   Very High Performed at [facility], [place] [address], [place], [place] [address]   Basic metabolic panel     Status: Abnormal   Collection Time: 02/28/21  4:11 AM  Result Value Ref Range   Sodium 136 135 - 145 mmol/L   Potassium 3.3 (L) 3.5 - 5.1 mmol/L   Chloride 105 98 - 111 mmol/L   CO2 25 22 - 32 mmol/L   Glucose, Bld 103 (H) 70 - 99 mg/dL    Comment: Glucose reference range applies only to samples taken after fasting for at least 8 hours.   BUN 9 8 - 23 mg/dL   Creatinine, Ser 0.77 0.61 - 1.24 mg/dL   Calcium 8.7 (L) 8.9 - 10.3 mg/dL   GFR, Estimated >60 >60 mL/min    Comment: (NOTE) Calculated using the CKD-EPI Creatinine Equation (2021)    Anion gap 6 5 - 15    Comment: Performed at [facility] [address] [place], [place] [address]  CBC with Differential/Platelet     Status: Abnormal   Collection Time: 02/28/21  4:11 AM  Result Value Ref Range   WBC 9.0 4.0 - 10.5 K/uL   RBC 4.52 4.22 - 5.81 MIL/uL   Hemoglobin 13.1 13.0 - 17.0 g/dL   HCT 38.7 (L) 39.0 - 52.0 %   MCV 85.6 80.0 - 100.0 fL   MCH 29.0 26.0 - 34.0 pg   MCHC 33.9 30.0 - 36.0 g/dL   RDW 13.2 11.5 - 15.5 %   Platelets 209 150 - 400 K/uL   nRBC 0.0 0.0 - 0.2 %   Neutrophils Relative % 61 %   Neutro Abs 5.6 1.7 - 7.7 K/uL   Lymphocytes Relative 27 %   Lymphs Abs 2.4 0.7 - 4.0 K/uL   Monocytes Relative 7 %   Monocytes Absolute 0.7 0.1 - 1.0 K/uL   Eosinophils Relative 4 %   Eosinophils Absolute 0.4 0.0 - 0.5 K/uL   Basophils Relative 1 %   Basophils Absolute 0.1 0.0 - 0.1 K/uL   Immature Granulocytes 0 %   Abs Immature Granulocytes 0.02 0.00 - 0.07 K/uL    Comment: Performed at [facility] [address], [place], [place] [address]  Glucose, capillary     Status: Abnormal   Collection Time:  02/28/21  8:10 AM  Result Value Ref Range   Glucose-Capillary 107 (H) 70 - 99 mg/dL    Comment: Glucose reference range applies only to samples taken after fasting for at least 8 hours.   Comment 1 Notify RN    Comment 2 Document in Chart     Current Facility-Administered Medications  Medication Dose Route Frequency Provider Last Rate Last Admin  . acetaminophen (TYLENOL) tablet 650 mg  650 mg Oral Q6H PRN Marianna Payment, MD       Or  . acetaminophen (TYLENOL) suppository 650 mg  650 mg Rectal Q6H PRN Marianna Payment, MD      . amLODipine (NORVASC) tablet 5 mg  5 mg Oral QHS Marianna Payment, MD   5 mg at 02/27/21 1036  . chlordiazePOXIDE (LIBRIUM) capsule 25 mg  25 mg Oral TID Marianna Payment, MD   25 mg at 02/28/21 0919   Followed by  . chlordiazePOXIDE (LIBRIUM) capsule 25 mg  25 mg Oral Rosario Adie, MD       Followed by  . [START ON 03/01/2021] chlordiazePOXIDE (LIBRIUM) capsule 25 mg  25 mg Oral Daily Coe,  Marland Kitchen, MD      . enoxaparin (LOVENOX) injection 40 mg  40 mg Subcutaneous Q24H Marianna Payment, MD   40 mg at 02/27/21 2128  . ezetimibe (ZETIA) tablet 10 mg  10 mg Oral Daily Marianna Payment, MD   10 mg at 02/28/21 0919  . fentaNYL (DURAGESIC) 50 MCG/HR 1 patch  1 patch Transdermal Once Aldine Contes, MD   1 patch at 02/27/21 0723  . [START ON 03/02/2021] fentaNYL (DURAGESIC) 50 MCG/HR 1 patch  1 patch Transdermal Q72H Narendra, Nischal, MD      . FLUoxetine (PROZAC) capsule 40 mg  40 mg Oral Daily Marianna Payment, MD   40 mg at 02/28/21 0919  . folic acid (FOLVITE) tablet 1 mg  1 mg Oral Daily Marianna Payment, MD   1 mg at 02/28/21 3614  . hydrOXYzine (ATARAX/VISTARIL) tablet 25 mg  25 mg Oral Q6H PRN Marianna Payment, MD      . lamoTRIgine (LAMICTAL) tablet 100 mg  100 mg Oral BID Marianna Payment, MD   100 mg at 02/28/21 0919  . loperamide (IMODIUM) capsule 2-4 mg  2-4 mg Oral PRN Marianna Payment, MD      . LORazepam (ATIVAN) tablet 1-4 mg  1-4 mg Oral Q1H PRN Marianna Payment, MD   1 mg at  02/27/21 2130   Or  . LORazepam (ATIVAN) injection 1-4 mg  1-4 mg Intravenous Q1H PRN Marianna Payment, MD   4 mg at 02/26/21 2243  . multivitamin with minerals tablet 1 tablet  1 tablet Oral Daily Marianna Payment, MD   1 tablet at 02/28/21 0919  . nicotine (NICODERM CQ - dosed in mg/24 hours) patch 14 mg  14 mg Transdermal Daily Marianna Payment, MD   14 mg at 02/27/21 1039  . omega-3 acid ethyl esters (LOVAZA) capsule 1 g  1 g Oral QHS Marianna Payment, MD   1 g at 02/27/21 2129  . ondansetron (ZOFRAN) tablet 4 mg  4 mg Oral Q6H PRN Marianna Payment, MD       Or  . ondansetron [facility]) injection 4 mg  4 mg Intravenous Q6H PRN Marianna Payment, MD      . pantoprazole (PROTONIX) EC tablet 40 mg  40 mg Oral Daily PRN Marianna Payment, MD      . polyethylene glycol (MIRALAX / GLYCOLAX) packet 17 g  17 g Oral Daily PRN Marianna Payment, MD      . potassium chloride SA (KLOR-CON) CR tablet 40 mEq  40 mEq Oral BID Virl Axe, MD   40 mEq at 02/28/21 0919  . senna (SENOKOT) tablet 8.6 mg  1 tablet Oral QHS Marianna Payment, MD   8.6 mg at 02/26/21 2225  . sodium chloride flush (NS) 0.9 % injection 3 mL  3 mL Intravenous Q12H Marianna Payment, MD   3 mL at 02/27/21 2131  . thiamine tablet 100 mg  100 mg Oral Daily Marianna Payment, MD   100 mg at 02/28/21 4315   Or  . thiamine (B-1) injection 100 mg  100 mg Intravenous Daily Marianna Payment, MD   100 mg at 02/26/21 2230    Musculoskeletal: Strength & Muscle Tone: within normal limits Gait & Station: remains in bed on exam Patient leans: N/A            Psychiatric Specialty Exam:  Presentation  General Appearance: Appropriate for Environment  Eye Contact:Good  Speech:Clear and Coherent  Speech Volume:Normal  Handedness:No data recorded  Mood and Affect  Mood:Euthymic  Affect:Congruent  Thought Process  Thought Processes:Coherent  Descriptions of Associations:Intact  Orientation:Full (Time, Place and Person)  Thought Content:Logical  History  of Schizophrenia/Schizoaffective disorder:No data recorded Duration of Psychotic Symptoms:No data recorded Hallucinations:Hallucinations: None  Ideas of Reference:None  Suicidal Thoughts:Suicidal Thoughts: No  Homicidal Thoughts:Homicidal Thoughts: No   Sensorium  Memory:Immediate Fair; Recent Fair; Remote Good  Judgment:Fair  Insight:Fair   Executive Functions  Concentration:Good  Attention Span:Good  Keys of Knowledge:Good  Language:Good   Psychomotor Activity  Psychomotor Activity:Psychomotor Activity: Normal   Assets  Assets:Resilience; Armed forces logistics/support/administrative officer; Desire for Improvement; Social Support; Housing; Intimacy   Sleep  Sleep:Sleep: Fair   Physical Exam: Physical Exam Constitutional:      Appearance: Normal appearance.  HENT:     Head: Normocephalic and atraumatic.  Eyes:     Extraocular Movements: Extraocular movements intact.     Conjunctiva/sclera: Conjunctivae normal.  Cardiovascular:     Rate and Rhythm: Normal rate.  Pulmonary:     Effort: Pulmonary effort is normal.     Breath sounds: Normal breath sounds.  Abdominal:     General: Abdomen is flat.  Musculoskeletal:        General: Normal range of motion.  Skin:    General: Skin is warm and dry.  Neurological:     General: No focal deficit present.     Mental Status: He is alert.    Review of Systems  Constitutional: Negative for chills and fever.  HENT: Negative for hearing loss.   Eyes: Negative for blurred vision.  Respiratory: Negative for cough and wheezing.   Cardiovascular: Negative for chest pain.  Gastrointestinal: Negative for abdominal pain.  Neurological: Negative for dizziness.  Psychiatric/Behavioral: Positive for substance abuse. Negative for suicidal ideas.   Blood pressure 136/75, pulse 85, temperature 97.8 F (36.6 C), temperature source Oral, resp. rate 10, height 5\' 10"  (1.778 m), weight 99.8 kg, SpO2 98 %. Body mass index is 31.57  kg/m.  Treatment Plan Summary: Medication management   Recommendations Benzodiazepine withdrawal Patient continues to endorse sudden discontinuation of Xanx approx 10-14 days ago.  - Agree w/ Primary team's current tx plan of Librium taper - Once patient is stable do not restart Xanax  Anxiety Patient has a hx of Benzo use disorder and is currently in withdrawal after reported abuse of the medication. Once patient is medically stable. Discussed with patient and his wife that wife should lock patient's pills for now and be the person who provides patient with his medications. Patient endorses that he agrees and recognizes that he is more prone to abuse medications in pill form. - recommend Atarax 25mg  TID PRN for anxiety and follow up w/ OP Psych.  - Recommend Atarax 25mg  TID PRN  Bipolar disorder Patient continues to endorse medication compliance and appears more oriented on exam today and notes that he does not have trouble taking non-PRN medications.  - Continue lamictal 100mg  BID - Continue Prozac 40mg  daily - Lamictal level pending  Disposition: Per primary team. Recommend patient follow-up with his OP psych within 1-2 weeks from discharge. Patient displayed significant improvement today and no longer appears to meet criteria for inpatient.   Patient is determined to be psychiatrically stable at this time. Psychiatry will sign off. Please do not hesitate to call back if questions arise. Thank you for this consult.   PGY-1 Freida Busman, MD 02/28/2021 10:37 AM

## 2021-03-01 LAB — BASIC METABOLIC PANEL
Anion gap: 7 (ref 5–15)
BUN: 9 mg/dL (ref 8–23)
CO2: 28 mmol/L (ref 22–32)
Calcium: 9.1 mg/dL (ref 8.9–10.3)
Chloride: 107 mmol/L (ref 98–111)
Creatinine, Ser: 0.74 mg/dL (ref 0.61–1.24)
GFR, Estimated: >60 mL/min (ref >60)
Glucose, Bld: 114 mg/dL — ABNORMAL HIGH (ref 70–99)
Potassium: 3.9 mmol/L (ref 3.5–5.1)
Sodium: 142 mmol/L (ref 135–145)

## 2021-03-01 LAB — GLUCOSE, CAPILLARY: Glucose-Capillary: 116 mg/dL — ABNORMAL HIGH (ref 70–99)

## 2021-03-01 MED ORDER — HYDROXYZINE HCL 25 MG PO TABS: 25.0000 mg | ORAL_TABLET | Freq: Three times a day (TID) | ORAL | 1 refills | Status: DC | PRN

## 2021-03-01 NOTE — Discharge Summary (Addendum)
Name: Stephen Porter MRN: [id number] DOB: [birth year] 65 y.o. PCP: Monico Blitz, MD  Date of Admission: 02/26/2021 11:40 AM Date of Discharge:  03/01/2021 Attending Physician: Aldine Contes, MD  Discharge Diagnosis: 1. Acute benzodiazepine withdrawal 2. Anxiety 3. Bipolar 1 Disorder  Discharge Medications: Allergies as of 03/01/2021      Reactions   Elavil [amitriptyline] Other (See Comments)   Made the patient "shaky"      Medication List    STOP taking these medications   ALPRAZolam 0.5 MG tablet Commonly known as: XANAX   aspirin EC 325 MG tablet   baclofen 10 MG tablet Commonly known as: LIORESAL   nicotine 14 mg/24hr patch Commonly known as: NICODERM CQ - dosed in mg/24 hours   promethazine-dextromethorphan 6.25-15 MG/5ML syrup Commonly known as: PROMETHAZINE-DM     TAKE these medications   amLODipine 5 MG tablet Commonly known as: NORVASC Take 5 mg by mouth at bedtime.   ezetimibe 10 MG tablet Commonly known as: ZETIA Take 1 tablet (10 mg total) by mouth daily. What changed: when to take this   fentaNYL 50 MCG/HR Commonly known as: DURAGESIC Apply 50 mcg/hr topically every 3 (three) days.   FLUoxetine 40 MG capsule Commonly known as: PROzac Take 1 capsule (40 mg total) by mouth daily.   hydrOXYzine 25 MG tablet Commonly known as: ATARAX/VISTARIL Take 1 tablet (25 mg total) by mouth 3 (three) times daily as needed for anxiety.   ibuprofen 200 MG tablet Commonly known as: ADVIL Take 400-600 mg by mouth every 6 (six) hours as needed for mild pain or headache.   lamoTRIgine 100 MG tablet Commonly known as: LAMICTAL Take 1 tablet (100 mg total) by mouth 2 (two) times daily.   nicotine polacrilex 4 MG gum Commonly known as: NICORETTE Take 4 mg by mouth as needed (for any urge to smoke).   omega-3 acid ethyl esters 1 g capsule Commonly known as: LOVAZA Take 1 capsule (1 g total) by mouth 2 (two) times daily. What changed: when to take this    pantoprazole 40 MG tablet Commonly known as: PROTONIX Take 40 mg by mouth daily as needed (for reflux).   Vitamin D3 10 MCG (400 UNIT) Caps Take 400 Units by mouth at bedtime.       Disposition and follow-up:   Stephen Porter was discharged from [facility] in Stable condition.  At the hospital follow up visit please address:  1.  Acute benzodiazepine withdrawal. Improved with librium and CIWA w/ativan. Discontinuing home valium at discharge. Will discharge with hydroxyzine 25mg  TID prn for anxiety instead. F/u with PCP, Monico Blitz, in 2-3 weeks.  2. Anxiety.  Discontinuing home Valium at discharge.  Discharged with hydroxyzine 25 mg 3 times daily as needed for anxiety.  Follow-up with PCP in 2 to 3 weeks.  2.  Labs / imaging needed at time of follow-up: BMP, UDS  3.  Pending labs/ test needing follow-up: None  Follow-up Appointments:  Follow-up Information    Monico Blitz, MD Follow up in 2 week(s).   Specialty: Internal Medicine Contact information: [address]  [place] [address] [place] [facility] Course by problem list: 1. Acute benzodiazepine withdrawal.  Stephen Porter initially presented with increased weakness in right lower extremity and tremulousness for the past day.  Patient was initially unable to provide history, but patient's wife was at bedside for collateral information.  Wife stated that patient had been prescribed Xanax by psychiatrist and the dose was 0.5 mg 4 times daily.  However, patient had been taking increased Xanax at home up to 10 to 12 tablets/day but then ran out of his medications a week prior to admission.  He has previously been in rehab for oxycodone use disorder as well as benzodiazepine use disorder.  Patient also has a previous alcohol use disorder history but has not consumed any alcohol in approximately 30 years.  Patient's right lower extremity weakness resolved after coming to the ED.  CT lumbar spine  showed some disc bulge at L4-L5 which is noncompressive.  CT C-spine showed prior surgical changes as well as mild degenerative disc disease but no acute abnormalities.  No further work-up for this was needed as it did not recur throughout admission.  While in the ED patient was noted to have visual hallucinations, tremors, as well as increased anxiety in the setting of running out of benzodiazepines.  His UDS was negative for benzodiazepines as well which is consistent with story of running out of medications.  His current presentation is consistent with acute benzodiazepine withdrawal.  He was started on Librium as well as CIWA with Ativan.  Patient's withdrawal symptoms are significantly improved yesterday.  Today, he completed his Librium taper and feels much better overall.  He has not required Ativan in 2 days now.  At discharge, will discontinue home Valium and instead prescribe hydroxyzine for anxiety.  Follow-up with PCP in 2 to 3 weeks.  2. Bipolar 1 disorder.  Anxiety.  Patient's bipolar 1 disorder well-controlled with Lamictal and fluoxetine. Lamictal level wnl. Continued home medications while here.  Will resume home medications at discharge.  Psychiatry was consulted for medication recommendations in regards to patient's anxiety.  He had been treating this with Xanax as prescribed by a psychiatrist.  Psychiatry recommended discontinuing Xanax altogether and instead starting patient on hydroxyzine as needed.  At discharge, will discontinue Xanax and prescribe hydroxyzine 25 mg 3 times daily as needed.  Follow-up with PCP in 2 to 3 weeks.  Discharge Subjective:  Stephen Porter feels ready to go home. He does not feel he needs [facility] services. He does note some disorientation when waking up from a nap though.   Reevaluated patient this afternoon. Feels much better and would like to go home today. Transportation will be with spouse.  Discharge Exam:   BP (!) 145/89 (BP Location: Left Arm)   Pulse 66    Temp 97.9 F (36.6 C) (Oral)   Resp 11   Ht 5\' 10"  (1.778 m)   Wt 99.8 kg   SpO2 95%   BMI 31.57 kg/m  Discharge exam:  General: Elderly male, lying comfortably in bed, NAD. CV: normal rate and regular rhythm, no m/r/g. Pulm: CTABL, no adventitious sounds noted. Abdomen: soft, nontender, nondistended, normoactive bowel sounds. Neuro: AAOx4, no focal deficits noted. Skin: warm and dry  Pertinent Labs, Studies, and Procedures:  CBC Latest Ref Rng & Units 02/28/2021 02/27/2021 02/26/2021  WBC 4.0 - 10.5 K/uL 9.0 9.4 9.4  Hemoglobin 13.0 - 17.0 g/dL 13.1 12.9(L) 15.5  Hematocrit 39.0 - 52.0 % 38.7(L) 38.7(L) 46.5  Platelets 150 - 400 K/uL 209 222 273   CMP Latest Ref Rng & Units 03/01/2021 02/28/2021 02/27/2021  Glucose 70 - 99 mg/dL 114(H) 103(H) 98  BUN 8 - 23 mg/dL 9 9 9   Creatinine 0.61 - 1.24 mg/dL 0.74 0.77 0.73  Sodium 135 - 145 mmol/L 142  136 140  Potassium 3.5 - 5.1 mmol/L 3.9 3.3(L) 3.6  Chloride 98 - 111 mmol/L 107 105 108  CO2 22 - 32 mmol/L 28 25 24   Calcium 8.9 - 10.3 mg/dL 9.1 8.7(L) 9.0  Total Protein 6.5 - 8.1 g/dL - - -  Total Bilirubin 0.3 - 1.2 mg/dL - - -  Alkaline Phos 38 - 126 U/L - - -  AST 15 - 41 U/L - - -  ALT 0 - 44 U/L - - -   Urinalysis    Component Value Date/Time   COLORURINE YELLOW 02/26/2021 1400   APPEARANCEUR CLEAR 02/26/2021 1400   LABSPEC 1.044 (H) 02/26/2021 1400   PHURINE 7.0 02/26/2021 1400   GLUCOSEU NEGATIVE 02/26/2021 1400   HGBUR NEGATIVE 02/26/2021 1400   BILIRUBINUR NEGATIVE 02/26/2021 1400   KETONESUR 20 (A) 02/26/2021 1400   PROTEINUR NEGATIVE 02/26/2021 1400   UROBILINOGEN 0.2 01/26/2015 1200   NITRITE NEGATIVE 02/26/2021 1400   LEUKOCYTESUR NEGATIVE 02/26/2021 1400    Drugs of Abuse     Component Value Date/Time   LABOPIA NONE DETECTED 02/26/2021 1400   COCAINSCRNUR NONE DETECTED 02/26/2021 1400   LABBENZ NONE DETECTED 02/26/2021 1400   AMPHETMU NONE DETECTED 02/26/2021 1400   THCU NONE DETECTED 02/26/2021 1400    LABBARB NONE DETECTED 02/26/2021 1400    Blood Culture    Component Value Date/Time   SDES  02/26/2021 1323    BLOOD LEFT HAND BOTTLES DRAWN AEROBIC AND ANAEROBIC   SPECREQUEST Blood Culture adequate volume 02/26/2021 1323   CULT  02/26/2021 1323    NO GROWTH 3 DAYS Performed at [facility], [address] [place], [place] [address]    REPTSTATUS PENDING 02/26/2021 1323    Lipid Panel     Component Value Date/Time   CHOL 129 02/28/2021 0411   TRIG 126 02/28/2021 0411   TRIG 729 12/20/2009 0000   HDL 38 (L) 02/28/2021 0411   CHOLHDL 3.4 02/28/2021 0411   VLDL 25 02/28/2021 0411   LDLCALC 66 02/28/2021 0411   Lamotrigine level 3.6  HbA1c 5.5%  TSH 1.082   Discharge Instructions: Discharge Instructions    Call MD for:  difficulty breathing, headache or visual disturbances   Complete by: As directed    Call MD for:  extreme fatigue   Complete by: As directed    Call MD for:  hives   Complete by: As directed    Call MD for:  persistant dizziness or light-headedness   Complete by: As directed    Call MD for:  persistant nausea and vomiting   Complete by: As directed    Call MD for:  redness, tenderness, or signs of infection (pain, swelling, redness, odor or green/yellow discharge around incision site)   Complete by: As directed    Call MD for:  severe uncontrolled pain   Complete by: As directed    Call MD for:  temperature >100.4   Complete by: As directed    Diet - low sodium heart healthy   Complete by: As directed    Discharge instructions   Complete by: As directed    Mr Rosendahl, it was a pleasure taking care of you during your time here. You came in for withdrawals from benzodiazepines and were treated while you were here.  Please note the following:  1. Please stop taking xanax from now on.  2. I have prescribed Atarax to take as needed for anxiety. I have sent this to your preferred pharmacy in [place].  3. Please follow up with your primary care doctor  in 2-3 weeks.   Increase activity slowly   Complete by: As directed       Signed: Virl Axe, MD 03/01/2021, 2:53 PM   Pager: [phone]

## 2021-03-01 NOTE — Progress Notes (Signed)
Physical Therapy Treatment Patient Details Name: FREDDIE DYMEK MRN: [id number] DOB: 08/10/1956 Today's Date: 03/01/2021    History of Present Illness Pt is a 65 y/o male admitted secondary to difficulty walking and AMS. Thought to have toxic metabolic encephalopathy secondary to Benzodiazepine Withdrawal. PMH includesbipolar disorder, DDD, chron's and tobacco use.    PT Comments    Pt is making good, steady progress towards his PT goals, demonstrating an ability to ambulate up to at least ~300 ft without UE support and navigate several stairs with use of 1 handrail without LOB and only min guard assist this date. However, he continues to display shakiness and slight instability throughout all mobility, impacting his safety and placing him at risk for falls, supported by his DGI score of 16 this date. Pt does display appropriate alterations to his gait pattern and speed to safely adjust to challenges during the DGI though. Pt and his wife were educated on having UE support on stairs and close supervision for mobility at d/c home to decrease his risk for falls. Expecting pt to continue to steadily progress and return close to if not back at baseline fairly quickly as he is making great progress already and is A&Ox4 now. Thus, changed his d/c recs to no PT follow-up or AD needed. Will continue to follow acutely.    Follow Up Recommendations  Supervision/Assistance - 24 hour;No PT follow up     Equipment Recommendations  None recommended by PT    Recommendations for Other Services       Precautions / Restrictions Precautions Precautions: Fall Restrictions Weight Bearing Restrictions: No    Mobility  Bed Mobility Overal bed mobility: Needs Assistance Bed Mobility: Supine to Sit;Sit to Supine     Supine to sit: Supervision Sit to supine: Supervision   General bed mobility comments: Bed flat with rails down to simulate home, pt able to perform all bed mobility safely with supervision  only.    Transfers Overall transfer level: Needs assistance Equipment used: None Transfers: Sit to/from Stand Sit to Stand: Min guard         General transfer comment: Min guard for safety, slight instability/shakiness noted but no overt LOB.  Ambulation/Gait Ambulation/Gait assistance: Min guard Gait Distance (Feet): 300 Feet Assistive device: None Gait Pattern/deviations: Step-through pattern;Decreased stride length;Narrow base of support Gait velocity: Decreased Gait velocity interpretation: <1.8 ft/sec, indicate of risk for recurrent falls General Gait Details: Slow, unsteady gait. Shakiness noted throughout. Min guard A for safety. No overt LOB. Pt adjusts steps or gait speed with challenges of DGI.   Stairs Stairs: Yes Stairs assistance: Min guard Stair Management: One rail Right;One rail Left;Step to pattern;Forwards;Alternating pattern Number of Stairs: 3 General stair comments: Ascends with R rail and descends with L, alternating between reciprocal and step-to pattern. Min guard for safety with no overt LOB but shakiness and unsteadiness noted throughout, especially when descending.   Wheelchair Mobility    Modified Rankin (Stroke Patients Only)       Balance Overall balance assessment: Needs assistance Sitting-balance support: No upper extremity supported;Feet supported Sitting balance-Leahy Scale: Fair     Standing balance support: During functional activity;No upper extremity supported Standing balance-Leahy Scale: Fair Standing balance comment: No UE support but unsteadiness noted.                 Standardized Balance Assessment Standardized Balance Assessment : Dynamic Gait Index   Dynamic Gait Index Level Surface: Mild Impairment Change in Gait Speed: Mild Impairment Gait  with Horizontal Head Turns: Mild Impairment Gait with Vertical Head Turns: Mild Impairment Gait and Pivot Turn: Mild Impairment Step Over Obstacle: Mild  Impairment Step Around Obstacles: Mild Impairment Steps: Mild Impairment Total Score: 16      Cognition Arousal/Alertness: Awake/alert Behavior During Therapy: WFL for tasks assessed/performed Overall Cognitive Status: Within Functional Limits for tasks assessed                                 General Comments: A&Ox4.      Exercises      General Comments General comments (skin integrity, edema, etc.): Spouse in room      Pertinent Vitals/Pain Pain Assessment: Faces Faces Pain Scale: No hurt Pain Intervention(s): Monitored during session    Home Living                      Prior Function            PT Goals (current goals can now be found in the care plan section) Acute Rehab PT Goals Patient Stated Goal: to go to the beach with his family PT Goal Formulation: With patient Time For Goal Achievement: 03/13/21 Potential to Achieve Goals: Good Progress towards PT goals: Progressing toward goals    Frequency    Min 3X/week      PT Plan Discharge plan needs to be updated;Equipment recommendations need to be updated    Co-evaluation              AM-PAC PT "6 Clicks" Mobility   Outcome Measure  Help needed turning from your back to your side while in a flat bed without using bedrails?: A Little Help needed moving from lying on your back to sitting on the side of a flat bed without using bedrails?: A Little Help needed moving to and from a bed to a chair (including a wheelchair)?: A Little Help needed standing up from a chair using your arms (e.g., wheelchair or bedside chair)?: A Little Help needed to walk in hospital room?: A Little Help needed climbing 3-5 steps with a railing? : A Little 6 Click Score: 18    End of Session Equipment Utilized During Treatment: Gait belt Activity Tolerance: Patient tolerated treatment well Patient left: in bed;with call bell/phone within reach;with bed alarm set Nurse Communication: Mobility  status PT Visit Diagnosis: Unsteadiness on feet (R26.81);Muscle weakness (generalized) (M62.81);Other abnormalities of gait and mobility (R26.89)     Time: 2947-6546 PT Time Calculation (min) (ACUTE ONLY): 17 min  Charges:  $Gait Training: 8-22 mins                     Moishe Spice, PT, DPT Acute Rehabilitation Services  Pager: [phone] Office: [place] 03/01/2021, 2:04 PM

## 2021-03-03 LAB — CULTURE, BLOOD (ROUTINE X 2)
Culture: NO GROWTH
Culture: NO GROWTH
Special Requests: ADEQUATE
Special Requests: ADEQUATE

## 2021-03-21 ENCOUNTER — Telehealth (INDEPENDENT_AMBULATORY_CARE_PROVIDER_SITE_OTHER): Payer: Self-pay | Admitting: Internal Medicine

## 2021-03-21 DIAGNOSIS — Z79899 Other long term (current) drug therapy: Secondary | ICD-10-CM | POA: Diagnosis not present

## 2021-03-21 DIAGNOSIS — Z299 Encounter for prophylactic measures, unspecified: Secondary | ICD-10-CM | POA: Diagnosis not present

## 2021-03-21 DIAGNOSIS — Z1331 Encounter for screening for depression: Secondary | ICD-10-CM | POA: Diagnosis not present

## 2021-03-21 DIAGNOSIS — Z0000 Encounter for general adult medical examination without abnormal findings: Secondary | ICD-10-CM | POA: Diagnosis not present

## 2021-03-21 DIAGNOSIS — E7800 Pure hypercholesterolemia, unspecified: Secondary | ICD-10-CM | POA: Diagnosis not present

## 2021-03-21 DIAGNOSIS — M542 Cervicalgia: Secondary | ICD-10-CM | POA: Diagnosis not present

## 2021-03-21 DIAGNOSIS — Z1339 Encounter for screening examination for other mental health and behavioral disorders: Secondary | ICD-10-CM | POA: Diagnosis not present

## 2021-03-21 DIAGNOSIS — Z7189 Other specified counseling: Secondary | ICD-10-CM | POA: Diagnosis not present

## 2021-03-21 DIAGNOSIS — M2550 Pain in unspecified joint: Secondary | ICD-10-CM | POA: Diagnosis not present

## 2021-03-21 DIAGNOSIS — R5383 Other fatigue: Secondary | ICD-10-CM | POA: Diagnosis not present

## 2021-03-21 DIAGNOSIS — Z125 Encounter for screening for malignant neoplasm of prostate: Secondary | ICD-10-CM | POA: Diagnosis not present

## 2021-03-21 DIAGNOSIS — Z6830 Body mass index (BMI) 30.0-30.9, adult: Secondary | ICD-10-CM | POA: Diagnosis not present

## 2021-03-21 DIAGNOSIS — I10 Essential (primary) hypertension: Secondary | ICD-10-CM | POA: Diagnosis not present

## 2021-03-21 DIAGNOSIS — Z87891 Personal history of nicotine dependence: Secondary | ICD-10-CM | POA: Diagnosis not present

## 2021-03-21 NOTE — Telephone Encounter (Signed)
Stephen Porter from [facility] would like patients last colonoscopy report faxed to [place]

## 2021-03-21 NOTE — Telephone Encounter (Signed)
Report to faxed to [facility] [place]

## 2021-03-23 DIAGNOSIS — I5022 Chronic systolic (congestive) heart failure: Secondary | ICD-10-CM | POA: Diagnosis not present

## 2021-03-23 DIAGNOSIS — I10 Essential (primary) hypertension: Secondary | ICD-10-CM | POA: Diagnosis not present

## 2021-03-23 DIAGNOSIS — G4700 Insomnia, unspecified: Secondary | ICD-10-CM | POA: Diagnosis not present

## 2021-03-26 ENCOUNTER — Other Ambulatory Visit: Payer: Self-pay

## 2021-03-26 ENCOUNTER — Ambulatory Visit
Admission: EM | Admit: 2021-03-26 | Discharge: 2021-03-26 | Disposition: A | Discharge status: Home / Self Care | Payer: PPO

## 2021-03-26 ENCOUNTER — Encounter (HOSPITAL_COMMUNITY): Payer: Self-pay | Admitting: Emergency Medicine

## 2021-03-26 ENCOUNTER — Emergency Department (HOSPITAL_COMMUNITY): Payer: PPO

## 2021-03-26 ENCOUNTER — Emergency Department (HOSPITAL_COMMUNITY)
Admission: EM | Admit: 2021-03-26 | Discharge: 2021-03-26 | Disposition: A | Discharge status: Home / Self Care | Payer: PPO | Attending: Emergency Medicine | Admitting: Emergency Medicine

## 2021-03-26 DIAGNOSIS — Z9049 Acquired absence of other specified parts of digestive tract: Secondary | ICD-10-CM | POA: Diagnosis not present

## 2021-03-26 DIAGNOSIS — Z87891 Personal history of nicotine dependence: Secondary | ICD-10-CM | POA: Insufficient documentation

## 2021-03-26 DIAGNOSIS — R109 Unspecified abdominal pain: Secondary | ICD-10-CM | POA: Insufficient documentation

## 2021-03-26 DIAGNOSIS — R079 Chest pain, unspecified: Secondary | ICD-10-CM | POA: Diagnosis not present

## 2021-03-26 DIAGNOSIS — R0789 Other chest pain: Secondary | ICD-10-CM | POA: Diagnosis not present

## 2021-03-26 DIAGNOSIS — R42 Dizziness and giddiness: Secondary | ICD-10-CM | POA: Insufficient documentation

## 2021-03-26 DIAGNOSIS — I700 Atherosclerosis of aorta: Secondary | ICD-10-CM | POA: Diagnosis not present

## 2021-03-26 DIAGNOSIS — M47816 Spondylosis without myelopathy or radiculopathy, lumbar region: Secondary | ICD-10-CM | POA: Diagnosis not present

## 2021-03-26 DIAGNOSIS — H9222 Otorrhagia, left ear: Secondary | ICD-10-CM | POA: Diagnosis not present

## 2021-03-26 DIAGNOSIS — K429 Umbilical hernia without obstruction or gangrene: Secondary | ICD-10-CM | POA: Diagnosis not present

## 2021-03-26 LAB — CBC
HCT: 44.1 % (ref 39.0–52.0)
Hemoglobin: 14.8 g/dL (ref 13.0–17.0)
MCH: 29 pg (ref 26.0–34.0)
MCHC: 33.6 g/dL (ref 30.0–36.0)
MCV: 86.5 fL (ref 80.0–100.0)
Platelets: 238 10*3/uL (ref 150–400)
RBC: 5.1 MIL/uL (ref 4.22–5.81)
RDW: 13 % (ref 11.5–15.5)
WBC: 8.8 10*3/uL (ref 4.0–10.5)
nRBC: 0 % (ref 0.0–0.2)

## 2021-03-26 LAB — BASIC METABOLIC PANEL
Anion gap: 7 (ref 5–15)
BUN: 12 mg/dL (ref 8–23)
CO2: 24 mmol/L (ref 22–32)
Calcium: 8.6 mg/dL — ABNORMAL LOW (ref 8.9–10.3)
Chloride: 105 mmol/L (ref 98–111)
Creatinine, Ser: 0.98 mg/dL (ref 0.61–1.24)
GFR, Estimated: >60 mL/min (ref >60)
Glucose, Bld: 107 mg/dL — ABNORMAL HIGH (ref 70–99)
Potassium: 3.8 mmol/L (ref 3.5–5.1)
Sodium: 136 mmol/L (ref 135–145)

## 2021-03-26 LAB — URINALYSIS, ROUTINE W REFLEX MICROSCOPIC
Bilirubin Urine: NEGATIVE
Glucose, UA: NEGATIVE mg/dL
Hgb urine dipstick: NEGATIVE
Ketones, ur: NEGATIVE mg/dL
Leukocytes,Ua: NEGATIVE
Nitrite: NEGATIVE
Protein, ur: NEGATIVE mg/dL
Specific Gravity, Urine: 1.013 (ref 1.005–1.030)
pH: 6 (ref 5.0–8.0)

## 2021-03-26 MED ORDER — KETOROLAC TROMETHAMINE 30 MG/ML IJ SOLN
30.0000 mg | Freq: Once | INTRAMUSCULAR | Status: AC
Start: 2021-03-26 — End: 2021-03-26
  Administered 2021-03-26: 30 mg via INTRAVENOUS
  Filled 2021-03-26: qty 1

## 2021-03-26 NOTE — ED Provider Notes (Signed)
[facility] EMERGENCY DEPARTMENT Provider Note   CSN: [id number] Arrival date & time: 03/26/21  1444     History Chief Complaint  Patient presents with   Dizziness    Stephen Porter is a 65 y.o. male.  He is here with multiple complaints.  He said he woke up this morning with some blood coming out of his left ear.  He is also complaining of bilateral flank pain and thinks of something wrong with his kidneys.  He says its been going on for a little bit and has chronic back pain.  But he thinks it is now his kidneys.  No urinary symptoms.  No trauma.  He also has a pain in his left lower chest that he attributes to a floating rib.  No shortness of breath.  No nausea vomiting or diarrhea.  Said he feels dizzy at times.  The history is provided by the patient.  Flank Pain Episode onset: months. The problem has not changed since onset.Associated symptoms include chest pain and abdominal pain. Pertinent negatives include no headaches and no shortness of breath. The symptoms are aggravated by bending and twisting. Nothing relieves the symptoms. He has tried rest for the symptoms. The treatment provided no relief.  Ear Drainage This is a new problem. The current episode started 6 to 12 hours ago. The problem has been resolved. Associated symptoms include chest pain and abdominal pain. Pertinent negatives include no headaches and no shortness of breath. Nothing aggravates the symptoms. Nothing relieves the symptoms. He has tried nothing for the symptoms. The treatment provided no relief.      Past Medical History:  Diagnosis Date   Bipolar disorder ([place])    DDD (degenerative disc disease)    Depression    Hyperlipidemia    Insomnia    Jejunal ulcer    Leukocytosis 04/12/2016   Skin rash     Patient Active Problem List   Diagnosis Date Noted   Toxic encephalopathy 02/26/2021   TIA (transient ischemic attack)    Chronic pain syndrome    Dyslipidemia    Weakness of left lower extremity  12/09/2018   Major depression in full remission ([place]) 07/05/2016   Leukocytosis 04/12/2016   Insomnia due to mental disorder 09/03/2012   Psychoactive substance-induced organic mood disorder ([place]) 02/25/2012   Other depression due to general medical condition 02/24/2012   HYPERLIPIDEMIA 07/05/2010   ANXIETY 02/08/2010   Tobacco abuse 02/08/2010   DEPRESSION 02/08/2010   DEGENERATIVE [place] DISEASE 02/08/2010   CROHN'S DISEASE, HX OF 02/08/2010    Past Surgical History:  Procedure Laterality Date   BACK SURGERY     CERVICAL SPINE SURGERY     x3   CHOLECYSTECTOMY     COLONOSCOPY  2006   COLONOSCOPY N/A 06/30/2014   Procedure: COLONOSCOPY;  Surgeon: Rogene Houston, MD;  Location: [facility];  Service: Endoscopy;  Laterality: N/A;  25   LUMBAR SPINE SURGERY     UPPER GASTROINTESTINAL ENDOSCOPY         Family History  Problem Relation Age of Onset   Hypertension Sister    Anxiety disorder Sister    Hypertension Brother    Hypertension Brother    Hypertension Brother    Dementia Mother    Paranoid behavior Mother    Dementia Father    Alcohol abuse Paternal Uncle    Drug abuse Paternal Uncle    ADD / ADHD Neg Hx    Depression Neg Hx  OCD Neg Hx    Schizophrenia Neg Hx    Seizures Neg Hx    Sexual abuse Neg Hx    Physical abuse Neg Hx    Colon cancer Neg Hx     Social History   Tobacco Use   Smoking status: Former    Packs/day: 1.00    Years: 40.00    Pack years: 40.00    Types: Cigarettes   Smokeless tobacco: Current   Tobacco comments:    07-05-2016 Dip, 01-22-2017 per pt he stopped Sep 25, 2016  Vaping Use   Vaping Use: Never used  Substance Use Topics   Alcohol use: No    Comment: 07-05-2016 per pt no   Drug use: No    Comment: 07-05-2016 per pt no and he stopped Marijuana 1 yr ago    Home Medications Prior to Admission medications   Medication Sig Start Date End Date Taking? Authorizing Provider  amLODipine (NORVASC) 5 MG tablet Take 5 mg by  mouth at bedtime.     [provider]  Cholecalciferol (VITAMIN D3) 10 MCG (400 UNIT) CAPS Take 400 Units by mouth at bedtime.    [provider]  ezetimibe (ZETIA) 10 MG tablet Take 1 tablet (10 mg total) by mouth daily. Patient taking differently: Take 10 mg by mouth at bedtime. 12/10/18   Barton Dubois, MD  fentaNYL (DURAGESIC - DOSED MCG/HR) 50 MCG/HR Apply 50 mcg/hr topically every 3 (three) days. 08/03/12   [provider]  FLUoxetine (PROZAC) 40 MG capsule Take 1 capsule (40 mg total) by mouth daily. 01/20/21   Cloria Spring, MD  hydrOXYzine (ATARAX/VISTARIL) 25 MG tablet Take 1 tablet (25 mg total) by mouth 3 (three) times daily as needed for anxiety. 03/01/21   Virl Axe, MD  ibuprofen (ADVIL) 200 MG tablet Take 400-600 mg by mouth every 6 (six) hours as needed for mild pain or headache.    [provider]  lamoTRIgine (LAMICTAL) 100 MG tablet Take 1 tablet (100 mg total) by mouth 2 (two) times daily. 01/20/21   Cloria Spring, MD  nicotine polacrilex (NICORETTE) 4 MG gum Take 4 mg by mouth as needed (for any urge to smoke).    [provider]  omega-3 acid ethyl esters (LOVAZA) 1 g capsule Take 1 capsule (1 g total) by mouth 2 (two) times daily. Patient taking differently: Take 1 g by mouth at bedtime. 12/10/18   Barton Dubois, MD  pantoprazole (PROTONIX) 40 MG tablet Take 40 mg by mouth daily as needed (for reflux).    [provider]    Allergies    Elavil [amitriptyline]  Review of Systems   Review of Systems  Constitutional:  Negative for fever.  HENT:  Positive for ear discharge. Negative for sore throat.   Eyes:  Negative for visual disturbance.  Respiratory:  Negative for shortness of breath.   Cardiovascular:  Positive for chest pain.  Gastrointestinal:  Positive for abdominal pain.  Genitourinary:  Positive for flank pain. Negative for dysuria.  Musculoskeletal:  Positive for back pain.  Skin:  Negative for rash.   Neurological:  Positive for dizziness and light-headedness. Negative for headaches.   Physical Exam Updated Vital Signs BP (!) 149/89 (BP Location: Left Arm)   Pulse 63   Temp 98 F (36.7 C) (Oral)   Resp 15   Ht 5\' 10"  (1.778 m)   Wt 93 kg   SpO2 98%   BMI 29.41 kg/m   Physical Exam Vitals  and nursing note reviewed.  Constitutional:      Appearance: Normal appearance. He is well-developed.  HENT:     Head: Normocephalic and atraumatic.     Right Ear: Tympanic membrane normal.     Left Ear: Tympanic membrane normal.     Mouth/Throat:     Mouth: Mucous membranes are moist.     Pharynx: Oropharynx is clear.  Eyes:     Conjunctiva/sclera: Conjunctivae normal.  Cardiovascular:     Rate and Rhythm: Normal rate and regular rhythm.     Heart sounds: No murmur heard. Pulmonary:     Effort: Pulmonary effort is normal. No respiratory distress.     Breath sounds: Normal breath sounds.  Abdominal:     Palpations: Abdomen is soft.     Tenderness: There is no abdominal tenderness. There is no guarding or rebound.  Musculoskeletal:        General: No deformity or signs of injury. Normal range of motion.     Cervical back: Neck supple.  Skin:    General: Skin is warm and dry.  Neurological:     General: No focal deficit present.     Mental Status: He is alert and oriented to person, place, and time.     Cranial Nerves: No cranial nerve deficit.     Sensory: No sensory deficit.     Motor: No weakness.    ED Results / Procedures / Treatments   Labs (all labs ordered are listed, but only abnormal results are displayed) Labs Reviewed  BASIC METABOLIC PANEL - Abnormal; Notable for the following components:      Result Value   Glucose, Bld 107 (*)    Calcium 8.6 (*)    All other components within normal limits  CBC  URINALYSIS, ROUTINE W REFLEX MICROSCOPIC    EKG EKG Interpretation  Date/Time:  Sunday March 26 2021 15:45:03 EDT Ventricular Rate:  74 PR  Interval:  174 QRS Duration: 80 QT Interval:  412 QTC Calculation: 457 R Axis:   -73 Text Interpretation: Normal sinus rhythm Low voltage QRS Left anterior fascicular block Cannot rule out Anterior infarct , age undetermined Abnormal ECG No significant change since prior 6/22 Confirmed by Aletta Edouard [phone]) on 03/26/2021 4:07:26 PM  Radiology CT Renal Stone Study  Result Date: 03/26/2021 CLINICAL DATA:  Flank pain. Kidney stones suspected. Bilateral flank pain started yesterday. EXAM: CT ABDOMEN AND PELVIS WITHOUT CONTRAST TECHNIQUE: Multidetector CT imaging of the abdomen and pelvis was performed following the standard protocol without IV contrast. COMPARISON:  12/01/2015 FINDINGS: Lower chest: No acute abnormality. Hepatobiliary: Cholecystectomy.  Normal appearance of the liver. Pancreas: Unremarkable. No pancreatic ductal dilatation or surrounding inflammatory changes. Spleen: Normal in size without focal abnormality. Adrenals/Urinary Tract: Normal adrenal glands. Symmetric bilateral kidneys. No hydronephrosis or suspicious renal mass. No nephrolithiasis. Ureters are unremarkable. The bladder and visualized portion of the urethra are normal. Stomach/Bowel: Stomach and small bowel loops are normal in appearance. The appendix is well seen and has a normal appearance. Significant stool burden in normal appearing loops of large bowel. Vascular/Lymphatic: There is minimal atherosclerotic calcification of the abdominal aorta. No aneurysm. No retroperitoneal or mesenteric adenopathy. Reproductive: Prostate is unremarkable. Other: Small fat containing paraumbilical hernia.  No ascites. Musculoskeletal: Mild degenerative changes in the LOWER lumbar spine. Ray cages at L5-S1. No lytic or blastic lesions. IMPRESSION: 1. No acute abnormality of the abdomen or pelvis. 2. No nephrolithiasis or urinary tract abnormality. 3. Moderate stool burden. 4.  Aortic atherosclerosis.  (  ICD10-I70.0) 5. Cholecystectomy.  Electronically Signed   By: Nolon Nations M.D.   On: 03/26/2021 18:01    Procedures Procedures   Medications Ordered in ED Medications  ketorolac (TORADOL) 30 MG/ML injection 30 mg (30 mg Intravenous Given 03/26/21 2022)    ED Course  I have reviewed the triage vital signs and the nursing notes.  Pertinent labs & imaging results that were available during my care of the patient were reviewed by me and considered in my medical decision making (see chart for details).  Clinical Course as of 03/27/21 1004  Sun Mar 26, 2021  2053 Reviewed results with patient and his wife.  I have no explanation for why his ear was bleeding.  Recommended close follow-up with primary care doctor.  Return instructions discussed [MB]    Clinical Course User Index [MB] Hayden Rasmussen, MD   MDM Rules/Calculators/A&P                         This patient complains of blood from his left ear, bilateral flank pain, left lateral chest pain; this involves an extensive number of treatment Options and is a complaint that carries with it a high risk of complications and Morbidity. The differential includes ear trauma, ear infection, pyelonephritis, renal colic, retroperitoneal bleed, chronic pain, anxiety, ACS  I ordered, reviewed and interpreted labs, which included CBC with normal white count normal hemoglobin, chemistries fairly normal, urinalysis unremarkable I ordered medication IV Toradol with some improvement in his symptoms I ordered imaging studies which included CT renal and I independently    visualized and interpreted imaging which showed no acute findings Additional history obtained from patient's wife Previous records obtained and reviewed in epic, admission last month for altered mental status where he had his psychiatric medicines adjusted  After the interventions stated above, I reevaluated the patient and found patient hemodynamically stable in no distress.  Reviewed results of work-up with  him.  Recommended close follow-up with his primary care doctor.  Return instructions discussed   Final Clinical Impression(s) / ED Diagnoses Final diagnoses:  Otorrhagia of left ear  Flank pain    Rx / DC Orders ED Discharge Orders     None        Hayden Rasmussen, MD 03/27/21 1007

## 2021-03-26 NOTE — ED Triage Notes (Signed)
Pt to the ED with multiple complaints.  Pt states he woke up this morning with blood coming out of his left ear, dizziness, abdominal pain and bilateral flank pain.  Pt denies urinary symptoms.

## 2021-03-26 NOTE — Discharge Instructions (Addendum)
You are seen in the emergency department for bilateral flank pain and acute bleeding from your left ear.  There is no evidence of infection or bleeding in your ear.  You had blood work and a CAT scan of your abdomen and pelvis along with a urinalysis that did not show any significant findings.  Please contact your primary care doctor for close follow-up.

## 2021-03-30 DIAGNOSIS — L989 Disorder of the skin and subcutaneous tissue, unspecified: Secondary | ICD-10-CM | POA: Diagnosis not present

## 2021-03-30 DIAGNOSIS — Z789 Other specified health status: Secondary | ICD-10-CM | POA: Diagnosis not present

## 2021-03-30 DIAGNOSIS — F339 Major depressive disorder, recurrent, unspecified: Secondary | ICD-10-CM | POA: Diagnosis not present

## 2021-03-30 DIAGNOSIS — I10 Essential (primary) hypertension: Secondary | ICD-10-CM | POA: Diagnosis not present

## 2021-03-30 DIAGNOSIS — R42 Dizziness and giddiness: Secondary | ICD-10-CM | POA: Diagnosis not present

## 2021-03-30 DIAGNOSIS — G959 Disease of spinal cord, unspecified: Secondary | ICD-10-CM | POA: Diagnosis not present

## 2021-03-30 DIAGNOSIS — Z299 Encounter for prophylactic measures, unspecified: Secondary | ICD-10-CM | POA: Diagnosis not present

## 2021-04-07 DIAGNOSIS — R42 Dizziness and giddiness: Secondary | ICD-10-CM | POA: Diagnosis not present

## 2021-04-07 DIAGNOSIS — G8929 Other chronic pain: Secondary | ICD-10-CM | POA: Diagnosis not present

## 2021-04-07 DIAGNOSIS — A770 Spotted fever due to Rickettsia rickettsii: Secondary | ICD-10-CM | POA: Diagnosis not present

## 2021-04-07 DIAGNOSIS — Z299 Encounter for prophylactic measures, unspecified: Secondary | ICD-10-CM | POA: Diagnosis not present

## 2021-04-07 DIAGNOSIS — M549 Dorsalgia, unspecified: Secondary | ICD-10-CM | POA: Diagnosis not present

## 2021-04-20 DIAGNOSIS — M549 Dorsalgia, unspecified: Secondary | ICD-10-CM | POA: Diagnosis not present

## 2021-04-20 DIAGNOSIS — Z87891 Personal history of nicotine dependence: Secondary | ICD-10-CM | POA: Diagnosis not present

## 2021-04-20 DIAGNOSIS — Z299 Encounter for prophylactic measures, unspecified: Secondary | ICD-10-CM | POA: Diagnosis not present

## 2021-04-20 DIAGNOSIS — G8929 Other chronic pain: Secondary | ICD-10-CM | POA: Diagnosis not present

## 2021-04-20 DIAGNOSIS — Z6826 Body mass index (BMI) 26.0-26.9, adult: Secondary | ICD-10-CM | POA: Diagnosis not present

## 2021-04-20 DIAGNOSIS — I10 Essential (primary) hypertension: Secondary | ICD-10-CM | POA: Diagnosis not present

## 2021-04-23 DIAGNOSIS — G4700 Insomnia, unspecified: Secondary | ICD-10-CM | POA: Diagnosis not present

## 2021-04-23 DIAGNOSIS — I10 Essential (primary) hypertension: Secondary | ICD-10-CM | POA: Diagnosis not present

## 2021-04-23 DIAGNOSIS — I5022 Chronic systolic (congestive) heart failure: Secondary | ICD-10-CM | POA: Diagnosis not present

## 2021-04-24 DIAGNOSIS — Z1283 Encounter for screening for malignant neoplasm of skin: Secondary | ICD-10-CM | POA: Diagnosis not present

## 2021-04-24 DIAGNOSIS — L821 Other seborrheic keratosis: Secondary | ICD-10-CM | POA: Diagnosis not present

## 2021-04-24 DIAGNOSIS — D1801 Hemangioma of skin and subcutaneous tissue: Secondary | ICD-10-CM | POA: Diagnosis not present

## 2021-04-24 DIAGNOSIS — L570 Actinic keratosis: Secondary | ICD-10-CM | POA: Diagnosis not present

## 2021-04-24 DIAGNOSIS — D485 Neoplasm of uncertain behavior of skin: Secondary | ICD-10-CM | POA: Diagnosis not present

## 2021-04-24 DIAGNOSIS — L814 Other melanin hyperpigmentation: Secondary | ICD-10-CM | POA: Diagnosis not present

## 2021-04-24 DIAGNOSIS — D225 Melanocytic nevi of trunk: Secondary | ICD-10-CM | POA: Diagnosis not present

## 2021-04-24 DIAGNOSIS — D0339 Melanoma in situ of other parts of face: Secondary | ICD-10-CM | POA: Diagnosis not present

## 2021-04-26 ENCOUNTER — Other Ambulatory Visit (HOSPITAL_COMMUNITY): Payer: Self-pay | Admitting: Psychiatry

## 2021-05-04 DIAGNOSIS — L988 Other specified disorders of the skin and subcutaneous tissue: Secondary | ICD-10-CM | POA: Diagnosis not present

## 2021-05-04 DIAGNOSIS — C4339 Malignant melanoma of other parts of face: Secondary | ICD-10-CM | POA: Diagnosis not present

## 2021-05-17 DIAGNOSIS — Z6830 Body mass index (BMI) 30.0-30.9, adult: Secondary | ICD-10-CM | POA: Diagnosis not present

## 2021-05-17 DIAGNOSIS — Z299 Encounter for prophylactic measures, unspecified: Secondary | ICD-10-CM | POA: Diagnosis not present

## 2021-05-17 DIAGNOSIS — M549 Dorsalgia, unspecified: Secondary | ICD-10-CM | POA: Diagnosis not present

## 2021-05-17 DIAGNOSIS — I10 Essential (primary) hypertension: Secondary | ICD-10-CM | POA: Diagnosis not present

## 2021-05-17 DIAGNOSIS — G8929 Other chronic pain: Secondary | ICD-10-CM | POA: Diagnosis not present

## 2021-05-28 ENCOUNTER — Other Ambulatory Visit (HOSPITAL_COMMUNITY): Payer: Self-pay | Admitting: Psychiatry

## 2021-05-30 NOTE — Telephone Encounter (Signed)
Call for appt

## 2021-06-01 DIAGNOSIS — D485 Neoplasm of uncertain behavior of skin: Secondary | ICD-10-CM | POA: Diagnosis not present

## 2021-06-01 DIAGNOSIS — L905 Scar conditions and fibrosis of skin: Secondary | ICD-10-CM | POA: Diagnosis not present

## 2021-06-15 DIAGNOSIS — D485 Neoplasm of uncertain behavior of skin: Secondary | ICD-10-CM | POA: Diagnosis not present

## 2021-06-15 DIAGNOSIS — D225 Melanocytic nevi of trunk: Secondary | ICD-10-CM | POA: Diagnosis not present

## 2021-06-20 DIAGNOSIS — G8929 Other chronic pain: Secondary | ICD-10-CM | POA: Diagnosis not present

## 2021-06-20 DIAGNOSIS — I700 Atherosclerosis of aorta: Secondary | ICD-10-CM | POA: Diagnosis not present

## 2021-06-20 DIAGNOSIS — Z6830 Body mass index (BMI) 30.0-30.9, adult: Secondary | ICD-10-CM | POA: Diagnosis not present

## 2021-06-20 DIAGNOSIS — M549 Dorsalgia, unspecified: Secondary | ICD-10-CM | POA: Diagnosis not present

## 2021-06-20 DIAGNOSIS — Z299 Encounter for prophylactic measures, unspecified: Secondary | ICD-10-CM | POA: Diagnosis not present

## 2021-07-19 DIAGNOSIS — Z299 Encounter for prophylactic measures, unspecified: Secondary | ICD-10-CM | POA: Diagnosis not present

## 2021-07-19 DIAGNOSIS — F32A Depression, unspecified: Secondary | ICD-10-CM | POA: Diagnosis not present

## 2021-07-19 DIAGNOSIS — G8929 Other chronic pain: Secondary | ICD-10-CM | POA: Diagnosis not present

## 2021-07-19 DIAGNOSIS — Z6830 Body mass index (BMI) 30.0-30.9, adult: Secondary | ICD-10-CM | POA: Diagnosis not present

## 2021-07-19 DIAGNOSIS — M549 Dorsalgia, unspecified: Secondary | ICD-10-CM | POA: Diagnosis not present

## 2021-07-24 DIAGNOSIS — I5022 Chronic systolic (congestive) heart failure: Secondary | ICD-10-CM | POA: Diagnosis not present

## 2021-07-24 DIAGNOSIS — I10 Essential (primary) hypertension: Secondary | ICD-10-CM | POA: Diagnosis not present

## 2021-07-24 DIAGNOSIS — G4700 Insomnia, unspecified: Secondary | ICD-10-CM | POA: Diagnosis not present

## 2021-08-16 DIAGNOSIS — G8929 Other chronic pain: Secondary | ICD-10-CM | POA: Diagnosis not present

## 2021-08-16 DIAGNOSIS — M549 Dorsalgia, unspecified: Secondary | ICD-10-CM | POA: Diagnosis not present

## 2021-08-16 DIAGNOSIS — F1120 Opioid dependence, uncomplicated: Secondary | ICD-10-CM | POA: Diagnosis not present

## 2021-08-16 DIAGNOSIS — Z299 Encounter for prophylactic measures, unspecified: Secondary | ICD-10-CM | POA: Diagnosis not present

## 2021-08-16 DIAGNOSIS — Z6830 Body mass index (BMI) 30.0-30.9, adult: Secondary | ICD-10-CM | POA: Diagnosis not present

## 2021-08-24 ENCOUNTER — Other Ambulatory Visit (HOSPITAL_COMMUNITY): Payer: Self-pay | Admitting: Psychiatry

## 2021-08-24 NOTE — Telephone Encounter (Signed)
Call for appt

## 2021-09-01 DIAGNOSIS — G2581 Restless legs syndrome: Secondary | ICD-10-CM | POA: Diagnosis not present

## 2021-09-01 DIAGNOSIS — G959 Disease of spinal cord, unspecified: Secondary | ICD-10-CM | POA: Diagnosis not present

## 2021-09-01 DIAGNOSIS — M549 Dorsalgia, unspecified: Secondary | ICD-10-CM | POA: Diagnosis not present

## 2021-09-01 DIAGNOSIS — Z299 Encounter for prophylactic measures, unspecified: Secondary | ICD-10-CM | POA: Diagnosis not present

## 2021-09-01 DIAGNOSIS — G8929 Other chronic pain: Secondary | ICD-10-CM | POA: Diagnosis not present

## 2021-09-06 DIAGNOSIS — G959 Disease of spinal cord, unspecified: Secondary | ICD-10-CM | POA: Diagnosis not present

## 2021-09-08 ENCOUNTER — Other Ambulatory Visit: Payer: Self-pay | Admitting: Family Medicine

## 2021-09-08 DIAGNOSIS — E041 Nontoxic single thyroid nodule: Secondary | ICD-10-CM

## 2021-09-14 ENCOUNTER — Ambulatory Visit (HOSPITAL_COMMUNITY)
Admission: RE | Admit: 2021-09-14 | Discharge: 2021-09-14 | Disposition: A | Discharge status: Home / Self Care | Payer: PPO | Source: Ambulatory Visit | Attending: Family Medicine | Admitting: Family Medicine

## 2021-09-14 ENCOUNTER — Other Ambulatory Visit: Payer: Self-pay

## 2021-09-14 DIAGNOSIS — E041 Nontoxic single thyroid nodule: Secondary | ICD-10-CM | POA: Insufficient documentation

## 2021-09-19 DIAGNOSIS — F319 Bipolar disorder, unspecified: Secondary | ICD-10-CM | POA: Diagnosis not present

## 2021-09-19 DIAGNOSIS — G959 Disease of spinal cord, unspecified: Secondary | ICD-10-CM | POA: Diagnosis not present

## 2021-09-19 DIAGNOSIS — Z6830 Body mass index (BMI) 30.0-30.9, adult: Secondary | ICD-10-CM | POA: Diagnosis not present

## 2021-09-19 DIAGNOSIS — I10 Essential (primary) hypertension: Secondary | ICD-10-CM | POA: Diagnosis not present

## 2021-09-19 DIAGNOSIS — Z299 Encounter for prophylactic measures, unspecified: Secondary | ICD-10-CM | POA: Diagnosis not present

## 2021-10-20 DIAGNOSIS — G4700 Insomnia, unspecified: Secondary | ICD-10-CM | POA: Diagnosis not present

## 2021-10-20 DIAGNOSIS — Z789 Other specified health status: Secondary | ICD-10-CM | POA: Diagnosis not present

## 2021-10-20 DIAGNOSIS — F339 Major depressive disorder, recurrent, unspecified: Secondary | ICD-10-CM | POA: Diagnosis not present

## 2021-10-20 DIAGNOSIS — G959 Disease of spinal cord, unspecified: Secondary | ICD-10-CM | POA: Diagnosis not present

## 2021-10-20 DIAGNOSIS — I700 Atherosclerosis of aorta: Secondary | ICD-10-CM | POA: Diagnosis not present

## 2021-10-20 DIAGNOSIS — Z299 Encounter for prophylactic measures, unspecified: Secondary | ICD-10-CM | POA: Diagnosis not present

## 2021-10-24 DIAGNOSIS — I10 Essential (primary) hypertension: Secondary | ICD-10-CM | POA: Diagnosis not present

## 2021-10-24 DIAGNOSIS — E782 Mixed hyperlipidemia: Secondary | ICD-10-CM | POA: Diagnosis not present

## 2021-11-09 DIAGNOSIS — Z6830 Body mass index (BMI) 30.0-30.9, adult: Secondary | ICD-10-CM | POA: Diagnosis not present

## 2021-11-09 DIAGNOSIS — E785 Hyperlipidemia, unspecified: Secondary | ICD-10-CM | POA: Diagnosis not present

## 2021-11-09 DIAGNOSIS — G8929 Other chronic pain: Secondary | ICD-10-CM | POA: Diagnosis not present

## 2021-11-09 DIAGNOSIS — I10 Essential (primary) hypertension: Secondary | ICD-10-CM | POA: Diagnosis not present

## 2021-11-09 DIAGNOSIS — M549 Dorsalgia, unspecified: Secondary | ICD-10-CM | POA: Diagnosis not present

## 2021-11-09 DIAGNOSIS — F330 Major depressive disorder, recurrent, mild: Secondary | ICD-10-CM | POA: Diagnosis not present

## 2021-11-09 DIAGNOSIS — F419 Anxiety disorder, unspecified: Secondary | ICD-10-CM | POA: Diagnosis not present

## 2021-11-20 DIAGNOSIS — K5190 Ulcerative colitis, unspecified, without complications: Secondary | ICD-10-CM | POA: Diagnosis not present

## 2021-11-20 DIAGNOSIS — Z299 Encounter for prophylactic measures, unspecified: Secondary | ICD-10-CM | POA: Diagnosis not present

## 2021-11-20 DIAGNOSIS — F17210 Nicotine dependence, cigarettes, uncomplicated: Secondary | ICD-10-CM | POA: Diagnosis not present

## 2021-11-20 DIAGNOSIS — F319 Bipolar disorder, unspecified: Secondary | ICD-10-CM | POA: Diagnosis not present

## 2021-11-20 DIAGNOSIS — M549 Dorsalgia, unspecified: Secondary | ICD-10-CM | POA: Diagnosis not present

## 2021-11-20 DIAGNOSIS — G8929 Other chronic pain: Secondary | ICD-10-CM | POA: Diagnosis not present

## 2021-12-18 DIAGNOSIS — Z713 Dietary counseling and surveillance: Secondary | ICD-10-CM | POA: Diagnosis not present

## 2021-12-18 DIAGNOSIS — I10 Essential (primary) hypertension: Secondary | ICD-10-CM | POA: Diagnosis not present

## 2021-12-18 DIAGNOSIS — G959 Disease of spinal cord, unspecified: Secondary | ICD-10-CM | POA: Diagnosis not present

## 2021-12-18 DIAGNOSIS — Z299 Encounter for prophylactic measures, unspecified: Secondary | ICD-10-CM | POA: Diagnosis not present

## 2021-12-18 DIAGNOSIS — Z6830 Body mass index (BMI) 30.0-30.9, adult: Secondary | ICD-10-CM | POA: Diagnosis not present

## 2022-01-01 DIAGNOSIS — I10 Essential (primary) hypertension: Secondary | ICD-10-CM | POA: Diagnosis not present

## 2022-01-01 DIAGNOSIS — M549 Dorsalgia, unspecified: Secondary | ICD-10-CM | POA: Diagnosis not present

## 2022-01-01 DIAGNOSIS — F330 Major depressive disorder, recurrent, mild: Secondary | ICD-10-CM | POA: Diagnosis not present

## 2022-01-01 DIAGNOSIS — Z6833 Body mass index (BMI) 33.0-33.9, adult: Secondary | ICD-10-CM | POA: Diagnosis not present

## 2022-01-01 DIAGNOSIS — F419 Anxiety disorder, unspecified: Secondary | ICD-10-CM | POA: Diagnosis not present

## 2022-01-01 DIAGNOSIS — G8929 Other chronic pain: Secondary | ICD-10-CM | POA: Diagnosis not present

## 2022-01-16 ENCOUNTER — Other Ambulatory Visit (HOSPITAL_COMMUNITY): Payer: Self-pay | Admitting: Psychiatry

## 2022-01-19 DIAGNOSIS — G959 Disease of spinal cord, unspecified: Secondary | ICD-10-CM | POA: Diagnosis not present

## 2022-01-19 DIAGNOSIS — Z299 Encounter for prophylactic measures, unspecified: Secondary | ICD-10-CM | POA: Diagnosis not present

## 2022-01-19 DIAGNOSIS — F319 Bipolar disorder, unspecified: Secondary | ICD-10-CM | POA: Diagnosis not present

## 2022-01-21 NOTE — Telephone Encounter (Signed)
Call for appt

## 2022-01-23 NOTE — Telephone Encounter (Signed)
Ok thanks 

## 2022-01-23 NOTE — Telephone Encounter (Signed)
Called patient to scheduled f/u appt and he stated he did not want to see provider anymore and he was okay with being discharged because he don't need a psychiatrist. Per pt his PCP doctor is filling his medications and declined resch appt. ?

## 2022-02-20 DIAGNOSIS — I10 Essential (primary) hypertension: Secondary | ICD-10-CM | POA: Diagnosis not present

## 2022-02-20 DIAGNOSIS — E7800 Pure hypercholesterolemia, unspecified: Secondary | ICD-10-CM | POA: Diagnosis not present

## 2022-02-20 DIAGNOSIS — Z299 Encounter for prophylactic measures, unspecified: Secondary | ICD-10-CM | POA: Diagnosis not present

## 2022-02-20 DIAGNOSIS — F339 Major depressive disorder, recurrent, unspecified: Secondary | ICD-10-CM | POA: Diagnosis not present

## 2022-02-20 DIAGNOSIS — G8929 Other chronic pain: Secondary | ICD-10-CM | POA: Diagnosis not present

## 2022-02-20 DIAGNOSIS — F17210 Nicotine dependence, cigarettes, uncomplicated: Secondary | ICD-10-CM | POA: Diagnosis not present

## 2022-02-26 ENCOUNTER — Other Ambulatory Visit (HOSPITAL_COMMUNITY): Payer: Self-pay | Admitting: Psychiatry

## 2022-02-26 DIAGNOSIS — H40033 Anatomical narrow angle, bilateral: Secondary | ICD-10-CM | POA: Diagnosis not present

## 2022-02-26 DIAGNOSIS — H2513 Age-related nuclear cataract, bilateral: Secondary | ICD-10-CM | POA: Diagnosis not present

## 2022-02-26 NOTE — Telephone Encounter (Signed)
Okay, get d/c letter ready

## 2022-02-26 NOTE — Telephone Encounter (Signed)
He MUST be seen to have further refills

## 2022-02-26 NOTE — Telephone Encounter (Signed)
Per pt his regular doctor is the one that's filling his medications. Per pt he do not need Dr. Harrington Challenger anymore. Per patient he is agreeable in getting dismissed from the practice due to not needing provider anymore.

## 2022-03-02 NOTE — Telephone Encounter (Signed)
I've never done this,Debra always got this ready for me, please ask Shawn to assist

## 2022-03-02 NOTE — Telephone Encounter (Signed)
Provider have to generate discharge letter for staff to discharge patient for staff to send letter via certified mail.

## 2022-03-05 ENCOUNTER — Encounter (HOSPITAL_COMMUNITY): Payer: Self-pay | Admitting: *Deleted

## 2022-03-08 NOTE — Telephone Encounter (Signed)
Letter was created in the system and provider signed it and letter was given to front staff to mail it out to patient.

## 2022-03-23 DIAGNOSIS — Z7189 Other specified counseling: Secondary | ICD-10-CM | POA: Diagnosis not present

## 2022-03-23 DIAGNOSIS — Z299 Encounter for prophylactic measures, unspecified: Secondary | ICD-10-CM | POA: Diagnosis not present

## 2022-03-23 DIAGNOSIS — Z1231 Encounter for screening mammogram for malignant neoplasm of breast: Secondary | ICD-10-CM | POA: Diagnosis not present

## 2022-03-23 DIAGNOSIS — Z6835 Body mass index (BMI) 35.0-35.9, adult: Secondary | ICD-10-CM | POA: Diagnosis not present

## 2022-03-23 DIAGNOSIS — Z1339 Encounter for screening examination for other mental health and behavioral disorders: Secondary | ICD-10-CM | POA: Diagnosis not present

## 2022-03-23 DIAGNOSIS — I10 Essential (primary) hypertension: Secondary | ICD-10-CM | POA: Diagnosis not present

## 2022-03-23 DIAGNOSIS — R5383 Other fatigue: Secondary | ICD-10-CM | POA: Diagnosis not present

## 2022-03-23 DIAGNOSIS — Z0000 Encounter for general adult medical examination without abnormal findings: Secondary | ICD-10-CM | POA: Diagnosis not present

## 2022-03-23 DIAGNOSIS — F319 Bipolar disorder, unspecified: Secondary | ICD-10-CM | POA: Diagnosis not present

## 2022-03-23 DIAGNOSIS — E7800 Pure hypercholesterolemia, unspecified: Secondary | ICD-10-CM | POA: Diagnosis not present

## 2022-03-23 DIAGNOSIS — Z1331 Encounter for screening for depression: Secondary | ICD-10-CM | POA: Diagnosis not present

## 2022-04-02 DIAGNOSIS — Z125 Encounter for screening for malignant neoplasm of prostate: Secondary | ICD-10-CM | POA: Diagnosis not present

## 2022-04-02 DIAGNOSIS — E7800 Pure hypercholesterolemia, unspecified: Secondary | ICD-10-CM | POA: Diagnosis not present

## 2022-04-02 DIAGNOSIS — R5383 Other fatigue: Secondary | ICD-10-CM | POA: Diagnosis not present

## 2022-04-02 DIAGNOSIS — Z79899 Other long term (current) drug therapy: Secondary | ICD-10-CM | POA: Diagnosis not present

## 2022-04-03 DIAGNOSIS — F331 Major depressive disorder, recurrent, moderate: Secondary | ICD-10-CM | POA: Diagnosis not present

## 2022-04-03 DIAGNOSIS — F419 Anxiety disorder, unspecified: Secondary | ICD-10-CM | POA: Diagnosis not present

## 2022-04-03 DIAGNOSIS — Z6833 Body mass index (BMI) 33.0-33.9, adult: Secondary | ICD-10-CM | POA: Diagnosis not present

## 2022-04-20 DIAGNOSIS — Z87891 Personal history of nicotine dependence: Secondary | ICD-10-CM | POA: Diagnosis not present

## 2022-04-20 DIAGNOSIS — Z6834 Body mass index (BMI) 34.0-34.9, adult: Secondary | ICD-10-CM | POA: Diagnosis not present

## 2022-04-20 DIAGNOSIS — Z299 Encounter for prophylactic measures, unspecified: Secondary | ICD-10-CM | POA: Diagnosis not present

## 2022-04-20 DIAGNOSIS — K5190 Ulcerative colitis, unspecified, without complications: Secondary | ICD-10-CM | POA: Diagnosis not present

## 2022-04-20 DIAGNOSIS — I10 Essential (primary) hypertension: Secondary | ICD-10-CM | POA: Diagnosis not present

## 2022-04-20 DIAGNOSIS — F1120 Opioid dependence, uncomplicated: Secondary | ICD-10-CM | POA: Diagnosis not present

## 2022-04-30 DIAGNOSIS — Z299 Encounter for prophylactic measures, unspecified: Secondary | ICD-10-CM | POA: Diagnosis not present

## 2022-04-30 DIAGNOSIS — I10 Essential (primary) hypertension: Secondary | ICD-10-CM | POA: Diagnosis not present

## 2022-04-30 DIAGNOSIS — G8929 Other chronic pain: Secondary | ICD-10-CM | POA: Diagnosis not present

## 2022-04-30 DIAGNOSIS — M549 Dorsalgia, unspecified: Secondary | ICD-10-CM | POA: Diagnosis not present

## 2022-05-18 DIAGNOSIS — I10 Essential (primary) hypertension: Secondary | ICD-10-CM | POA: Diagnosis not present

## 2022-05-18 DIAGNOSIS — M549 Dorsalgia, unspecified: Secondary | ICD-10-CM | POA: Diagnosis not present

## 2022-05-18 DIAGNOSIS — G8929 Other chronic pain: Secondary | ICD-10-CM | POA: Diagnosis not present

## 2022-05-18 DIAGNOSIS — Z299 Encounter for prophylactic measures, unspecified: Secondary | ICD-10-CM | POA: Diagnosis not present

## 2022-05-18 DIAGNOSIS — Z6834 Body mass index (BMI) 34.0-34.9, adult: Secondary | ICD-10-CM | POA: Diagnosis not present

## 2022-06-19 ENCOUNTER — Encounter: Payer: Self-pay | Admitting: Orthopedic Surgery

## 2022-06-19 ENCOUNTER — Ambulatory Visit (INDEPENDENT_AMBULATORY_CARE_PROVIDER_SITE_OTHER): Payer: PPO | Admitting: Orthopedic Surgery

## 2022-06-19 ENCOUNTER — Ambulatory Visit (INDEPENDENT_AMBULATORY_CARE_PROVIDER_SITE_OTHER): Payer: PPO

## 2022-06-19 VITALS — BP 151/91 | HR 92 | Ht 70.0 in | Wt 240.0 lb

## 2022-06-19 DIAGNOSIS — M25512 Pain in left shoulder: Secondary | ICD-10-CM

## 2022-06-19 DIAGNOSIS — G8929 Other chronic pain: Secondary | ICD-10-CM

## 2022-06-19 DIAGNOSIS — G959 Disease of spinal cord, unspecified: Secondary | ICD-10-CM | POA: Insufficient documentation

## 2022-06-19 MED ORDER — METHYLPREDNISOLONE ACETATE 40 MG/ML IJ SUSP
40.0000 mg | Freq: Once | INTRAMUSCULAR | Status: AC
  Administered 2022-06-19: 40 mg via INTRA_ARTICULAR

## 2022-06-19 NOTE — Patient Instructions (Signed)

## 2022-06-20 NOTE — Progress Notes (Signed)
New Patient Visit  Assessment: Stephen Porter is a 66 y.o. male with the following: 1. Chronic left shoulder pain  Plan: KELAND PEYTON has pain in his left shoulder.  This has been ongoing for several months.  No specific injury.  Radiographs without obvious injury.  He has restricted motion, and positive drop arm test.  It is affecting his sleep.  He is interested in anything that could help.  I recommended a steroid injection, and he is elected to proceed.  This was completed without issues.  He will follow-up as needed.  Procedure note injection Left shoulder    Verbal consent was obtained to inject the left shoulder, subacromial space Timeout was completed to confirm the site of injection.  The skin was prepped with alcohol and ethyl chloride was sprayed at the injection site.  A 21-gauge needle was used to inject 40 mg of Depo-Medrol and 1% lidocaine (3 cc) into the subacromial space of the left shoulder using a posterolateral approach.  There were no complications. A sterile bandage was applied.   Follow-up: Return if symptoms worsen or fail to improve.  Subjective:  Chief Complaint  Patient presents with   Shoulder Pain    Left     History of Present Illness: Stephen Porter is a 66 y.o. male who presents for evaluation of left shoulder pain.  He has had pain in the left shoulder for several months.  No specific onset.  The pain is progressively getting worse.  Pain also worsens at night, and is affecting his sleep.  He does have a history of multiple procedures on his neck, but the pain he is currently experiencing is nothing like the pain he has had previously.  He does have occasional tingling in the left hand.  He has not worked with physical therapy.  No prior injections.   Review of Systems: No fevers or chills Occasional tingling No chest pain No shortness of breath No bowel or bladder dysfunction No GI distress No headaches   Medical History:  Past  Medical History:  Diagnosis Date   Bipolar disorder ([place])    DDD (degenerative disc disease)    Depression    Hyperlipidemia    Insomnia    Jejunal ulcer    Leukocytosis 04/12/2016   Skin rash     Past Surgical History:  Procedure Laterality Date   BACK SURGERY     CERVICAL SPINE SURGERY     x3   CHOLECYSTECTOMY     COLONOSCOPY  2006   COLONOSCOPY N/A 06/30/2014   Procedure: COLONOSCOPY;  Surgeon: Rogene Houston, MD;  Location: [facility];  Service: Endoscopy;  Laterality: N/A;  41   LUMBAR SPINE SURGERY     UPPER GASTROINTESTINAL ENDOSCOPY      Family History  Problem Relation Age of Onset   Hypertension Sister    Anxiety disorder Sister    Hypertension Brother    Hypertension Brother    Hypertension Brother    Dementia Mother    Paranoid behavior Mother    Dementia Father    Alcohol abuse Paternal Uncle    Drug abuse Paternal Uncle    ADD / ADHD Neg Hx    Depression Neg Hx    OCD Neg Hx    Schizophrenia Neg Hx    Seizures Neg Hx    Sexual abuse Neg Hx    Physical abuse Neg Hx    Colon cancer Neg Hx    Social History  Tobacco Use   Smoking status: Former    Packs/day: 1.00    Years: 40.00    Total pack years: 40.00    Types: Cigarettes   Smokeless tobacco: Current   Tobacco comments:    07-05-2016 Dip, 01-22-2017 per pt he stopped Sep 25, 2016  Vaping Use   Vaping Use: Never used  Substance Use Topics   Alcohol use: No    Comment: 07-05-2016 per pt no   Drug use: No    Comment: 07-05-2016 per pt no and he stopped Marijuana 1 yr ago    Allergies  Allergen Reactions   Elavil [Amitriptyline] Other (See Comments)    Made the patient "shaky"    Current Meds  Medication Sig   amLODipine (NORVASC) 5 MG tablet Take 5 mg by mouth at bedtime.    ARIPiprazole (ABILIFY) 5 MG tablet Take 5 mg by mouth daily.   Cholecalciferol (VITAMIN D3) 10 MCG (400 UNIT) CAPS Take 400 Units by mouth at bedtime.   ezetimibe (ZETIA) 10 MG tablet Take 1 tablet (10 mg  total) by mouth daily. (Patient taking differently: Take 10 mg by mouth at bedtime.)   fentaNYL (DURAGESIC - DOSED MCG/HR) 50 MCG/HR Apply 50 mcg/hr topically every 3 (three) days.   FLUoxetine (PROZAC) 40 MG capsule TAKE ONE CAPSULE BY MOUTH EVERY DAY   ibuprofen (ADVIL) 200 MG tablet Take 400-600 mg by mouth every 6 (six) hours as needed for mild pain or headache.   lamoTRIgine (LAMICTAL) 100 MG tablet TAKE 1 TABLET BY MOUTH TWICE DAILY   nicotine polacrilex (NICORETTE) 4 MG gum Take 4 mg by mouth as needed (for any urge to smoke).    Objective: BP (!) 151/91   Pulse 92   Ht '5\' 10"'$  (1.778 m)   Wt 240 lb (108.9 kg)   BMI 34.44 kg/m   Physical Exam:  General: Alert and oriented. and No acute distress. Gait: Normal gait.  Left shoulder without deformity.  Diffuse tenderness to palpation.  Forward flexion is limited to 90 degrees, passively I can get him well beyond 90 degrees.  He exhibits discomfort with drop arm testing.  Pain in the empty can testing position.  Strength is affected due to pain.  Fingers warm and well-perfused.  IMAGING: I personally ordered and reviewed the following images  X-rays of the left shoulder were obtained in clinic today.  No acute injuries are noted.  We will maintain glenohumeral joint space.  There are some small osteophytes in the inferior aspect of the glenohumeral joint.  No evidence of proximal humeral migration.  Mild loss of joint space at the [facility] joint.  Impression: Left shoulder x-ray with mild degenerative changes   New Medications:  Meds ordered this encounter  Medications   methylPREDNISolone acetate (DEPO-MEDROL) injection 40 mg      Mordecai Rasmussen, MD  06/20/2022 11:35 AM

## 2022-07-04 DIAGNOSIS — M25512 Pain in left shoulder: Secondary | ICD-10-CM | POA: Diagnosis not present

## 2022-07-05 DIAGNOSIS — Z6833 Body mass index (BMI) 33.0-33.9, adult: Secondary | ICD-10-CM | POA: Diagnosis not present

## 2022-07-05 DIAGNOSIS — R030 Elevated blood-pressure reading, without diagnosis of hypertension: Secondary | ICD-10-CM | POA: Diagnosis not present

## 2022-07-05 DIAGNOSIS — E669 Obesity, unspecified: Secondary | ICD-10-CM | POA: Diagnosis not present

## 2022-07-05 DIAGNOSIS — H6691 Otitis media, unspecified, right ear: Secondary | ICD-10-CM | POA: Diagnosis not present

## 2022-07-05 DIAGNOSIS — R0602 Shortness of breath: Secondary | ICD-10-CM | POA: Diagnosis not present

## 2022-07-13 DIAGNOSIS — Z79899 Other long term (current) drug therapy: Secondary | ICD-10-CM | POA: Diagnosis not present

## 2022-07-13 DIAGNOSIS — Z6835 Body mass index (BMI) 35.0-35.9, adult: Secondary | ICD-10-CM | POA: Diagnosis not present

## 2022-07-13 DIAGNOSIS — Z299 Encounter for prophylactic measures, unspecified: Secondary | ICD-10-CM | POA: Diagnosis not present

## 2022-07-13 DIAGNOSIS — G8929 Other chronic pain: Secondary | ICD-10-CM | POA: Diagnosis not present

## 2022-07-13 DIAGNOSIS — M549 Dorsalgia, unspecified: Secondary | ICD-10-CM | POA: Diagnosis not present

## 2022-07-13 DIAGNOSIS — I10 Essential (primary) hypertension: Secondary | ICD-10-CM | POA: Diagnosis not present

## 2022-07-16 DIAGNOSIS — J40 Bronchitis, not specified as acute or chronic: Secondary | ICD-10-CM | POA: Diagnosis not present

## 2022-07-16 DIAGNOSIS — R519 Headache, unspecified: Secondary | ICD-10-CM | POA: Diagnosis not present

## 2022-07-16 DIAGNOSIS — R062 Wheezing: Secondary | ICD-10-CM | POA: Diagnosis not present

## 2022-07-16 DIAGNOSIS — R531 Weakness: Secondary | ICD-10-CM | POA: Diagnosis not present

## 2022-07-20 ENCOUNTER — Other Ambulatory Visit (HOSPITAL_COMMUNITY): Payer: Self-pay | Admitting: Psychiatry

## 2022-08-10 DIAGNOSIS — Z0000 Encounter for general adult medical examination without abnormal findings: Secondary | ICD-10-CM | POA: Diagnosis not present

## 2022-08-10 DIAGNOSIS — Z23 Encounter for immunization: Secondary | ICD-10-CM | POA: Diagnosis not present

## 2022-08-10 DIAGNOSIS — F319 Bipolar disorder, unspecified: Secondary | ICD-10-CM | POA: Diagnosis not present

## 2022-08-10 DIAGNOSIS — Z6835 Body mass index (BMI) 35.0-35.9, adult: Secondary | ICD-10-CM | POA: Diagnosis not present

## 2022-08-10 DIAGNOSIS — G959 Disease of spinal cord, unspecified: Secondary | ICD-10-CM | POA: Diagnosis not present

## 2022-08-10 DIAGNOSIS — R0683 Snoring: Secondary | ICD-10-CM | POA: Diagnosis not present

## 2022-08-10 DIAGNOSIS — I10 Essential (primary) hypertension: Secondary | ICD-10-CM | POA: Diagnosis not present

## 2022-08-10 DIAGNOSIS — Z299 Encounter for prophylactic measures, unspecified: Secondary | ICD-10-CM | POA: Diagnosis not present

## 2022-08-15 ENCOUNTER — Encounter (INDEPENDENT_AMBULATORY_CARE_PROVIDER_SITE_OTHER): Payer: Self-pay | Admitting: *Deleted

## 2022-10-03 DIAGNOSIS — Z87891 Personal history of nicotine dependence: Secondary | ICD-10-CM | POA: Diagnosis not present

## 2022-10-03 DIAGNOSIS — Z6835 Body mass index (BMI) 35.0-35.9, adult: Secondary | ICD-10-CM | POA: Diagnosis not present

## 2022-10-03 DIAGNOSIS — I10 Essential (primary) hypertension: Secondary | ICD-10-CM | POA: Diagnosis not present

## 2022-10-03 DIAGNOSIS — F319 Bipolar disorder, unspecified: Secondary | ICD-10-CM | POA: Diagnosis not present

## 2022-10-03 DIAGNOSIS — I700 Atherosclerosis of aorta: Secondary | ICD-10-CM | POA: Diagnosis not present

## 2022-10-03 DIAGNOSIS — M542 Cervicalgia: Secondary | ICD-10-CM | POA: Diagnosis not present

## 2022-10-03 DIAGNOSIS — Z299 Encounter for prophylactic measures, unspecified: Secondary | ICD-10-CM | POA: Diagnosis not present

## 2022-10-29 ENCOUNTER — Telehealth (INDEPENDENT_AMBULATORY_CARE_PROVIDER_SITE_OTHER): Payer: Self-pay | Admitting: *Deleted

## 2022-10-29 ENCOUNTER — Encounter (INDEPENDENT_AMBULATORY_CARE_PROVIDER_SITE_OTHER): Payer: Self-pay | Admitting: Gastroenterology

## 2022-10-29 ENCOUNTER — Ambulatory Visit (INDEPENDENT_AMBULATORY_CARE_PROVIDER_SITE_OTHER): Payer: PPO | Admitting: Gastroenterology

## 2022-10-29 VITALS — BP 126/77 | HR 71 | Temp 97.8°F | Ht 70.0 in | Wt 236.9 lb

## 2022-10-29 DIAGNOSIS — Z860100 Personal history of colon polyps, unspecified: Secondary | ICD-10-CM | POA: Insufficient documentation

## 2022-10-29 DIAGNOSIS — K5900 Constipation, unspecified: Secondary | ICD-10-CM | POA: Insufficient documentation

## 2022-10-29 DIAGNOSIS — Z8719 Personal history of other diseases of the digestive system: Secondary | ICD-10-CM | POA: Diagnosis not present

## 2022-10-29 DIAGNOSIS — Z79891 Long term (current) use of opiate analgesic: Secondary | ICD-10-CM

## 2022-10-29 DIAGNOSIS — K5903 Drug induced constipation: Secondary | ICD-10-CM | POA: Diagnosis not present

## 2022-10-29 MED ORDER — PEG 3350-KCL-NA BICARB-NACL 420 G PO SOLR: 4000.0000 mL | Freq: Once | ORAL | 0 refills | Status: AC

## 2022-10-29 NOTE — Telephone Encounter (Signed)
LMOVM to call back to schedule TCS with Dr. Jenetta Downer, ASA 1-2, 2 day prep

## 2022-10-29 NOTE — Telephone Encounter (Signed)
Pt returned call. Scheduled for 2/28 at 9:15am. Aware will need 2 day prep. Rx for prep sent to pharmacy. Instructions sent to pt also.

## 2022-10-29 NOTE — Progress Notes (Signed)
Stephen Porter, M.D. Gastroenterology & Hepatology [facility] [address] [place], [place] [address] Primary Care Physician: Stephen Blitz, MD [place] [address]  Referring MD: PCP  Chief Complaint: History of ulcerative colitis  History of Present Illness: Stephen Porter is a 67 y.o. male with PMH ulcerative colitis, depression, hyperlipidemia, bipolar disorder and chronic pain on opiates, who presents to establish care for his history of ulcerative colitis and for evaluation of constipation.   The patient reports that he was diagnosed with ulcerative colitis 20 years ago when he was presenting abdominal pain and cramping, along with constipation. He reports he never was hospitalized for this in the past.He states that he was on Pentasa for a year and eventually discontinued. He has not had any medical issues since he stopped the Pentasa, feeling well and denies having any complaints or hospitalizations since he stopped his medication in the past.  Has been off treatment for at least 18 years.  States he can have a BM irregularly, every 1-3 days. If he has not moved his bowels in 3 days, he will take magnesium citrate which helps him move his bowels. State she has very frequent bloating The patient denies having any nausea, vomiting, fever, chills, hematochezia, melena, hematemesis, abdominal pain, diarrhea, jaundice, pruritus or weight loss - he has actually gained some weight.  Most recent available labs from 03/26/2021 showed a CBC with WBC 8.8, hemoglobin 14.8 and platelets 238, BMP with sodium 136, potassium 3.8, chloride 105, creatinine 0.98, BUN 12.  He uses a fentanyl patch for chronic pain control.  Last EGD: Last Colonoscopy: 2015 1.  Sessile polyp was found in the ascending colon; polypectomy was performed using snare cautery 2.  Sessile polyp was found at the hepatic flexure; polypectomy was performed using snare  cautery 3.  Single polyp was found at the hepatic flexure; Destruction of lesion via ablation was attempted 4.  Small external hemorrhoids 5.  Both polyps were submitted together  Path: tubular adenomas  Recommended to repeat colonoscopy in 5 years.  FHx: neg for any gastrointestinal/liver disease, no malignancies Social: neg smoking, alcohol or illicit drug use Surgical: no abdominal surgeries  Past Medical History: Past Medical History:  Diagnosis Date   Bipolar disorder ([place])    DDD (degenerative disc disease)    Depression    Hyperlipidemia    Insomnia    Jejunal ulcer    Leukocytosis 04/12/2016   Skin rash     Past Surgical History: Past Surgical History:  Procedure Laterality Date   BACK SURGERY     CERVICAL SPINE SURGERY     x3   CHOLECYSTECTOMY     COLONOSCOPY  2006   COLONOSCOPY N/A 06/30/2014   Procedure: COLONOSCOPY;  Surgeon: Rogene Houston, MD;  [facility]: [facility];  Service: Endoscopy;  Laterality: N/A;  39   LUMBAR SPINE SURGERY     UPPER GASTROINTESTINAL ENDOSCOPY      Family History: Family History  Problem Relation Age of Onset   Hypertension Sister    Anxiety disorder Sister    Hypertension Brother    Hypertension Brother    Hypertension Brother    Dementia Mother    Paranoid behavior Mother    Dementia Father    Alcohol abuse Paternal Uncle    Drug abuse Paternal Uncle    ADD / ADHD Neg Hx    Depression Neg Hx    OCD Neg Hx    Schizophrenia  Neg Hx    Seizures Neg Hx    Sexual abuse Neg Hx    Physical abuse Neg Hx    Colon cancer Neg Hx     Social History: Social History   Tobacco Use  Smoking Status Former   Packs/day: 1.00   Years: 40.00   Total pack years: 40.00   Types: Cigarettes  Smokeless Tobacco Current  Tobacco Comments   07-05-2016 Dip, 01-22-2017 per pt he stopped Sep 25, 2016   Social History   Substance and Sexual Activity  Alcohol Use No   Comment: 07-05-2016 per pt no   Social History   Substance  and Sexual Activity  Drug Use No   Comment: 07-05-2016 per pt no and he stopped Marijuana 1 yr ago    Allergies: Allergies  Allergen Reactions   Elavil [Amitriptyline] Other (See Comments)    Made the patient "shaky"    Medications: Current Outpatient Medications  Medication Sig Dispense Refill   amLODipine (NORVASC) 5 MG tablet Take 5 mg by mouth at bedtime.      ARIPiprazole (ABILIFY) 5 MG tablet Take 5 mg by mouth daily.     ezetimibe (ZETIA) 10 MG tablet Take 1 tablet (10 mg total) by mouth daily. (Patient taking differently: Take 10 mg by mouth at bedtime.) 30 tablet 3   fentaNYL (DURAGESIC - DOSED MCG/HR) 50 MCG/HR Apply 50 mcg/hr topically every 3 (three) days.     FLUoxetine (PROZAC) 40 MG capsule TAKE ONE CAPSULE BY MOUTH EVERY DAY 90 capsule 0   ibuprofen (ADVIL) 200 MG tablet Take 400-600 mg by mouth every 6 (six) hours as needed for mild pain or headache.     lamoTRIgine (LAMICTAL) 100 MG tablet TAKE 1 TABLET BY MOUTH TWICE DAILY 60 tablet 3   nicotine polacrilex (NICORETTE) 4 MG gum Take 4 mg by mouth as needed (for any urge to smoke).     No current facility-administered medications for this visit.    Review of Systems: GENERAL: negative for malaise, night sweats HEENT: No changes in hearing or vision, no nose bleeds or other nasal problems. NECK: Negative for lumps, goiter, pain and significant neck swelling RESPIRATORY: Negative for cough, wheezing CARDIOVASCULAR: Negative for chest pain, leg swelling, palpitations, orthopnea GI: SEE HPI MUSCULOSKELETAL: Negative for joint pain or swelling, back pain, and muscle pain. SKIN: Negative for lesions, rash PSYCH: Negative for sleep disturbance, mood disorder and recent psychosocial stressors. HEMATOLOGY Negative for prolonged bleeding, bruising easily, and swollen nodes. ENDOCRINE: Negative for cold or heat intolerance, polyuria, polydipsia and goiter. NEURO: negative for tremor, gait imbalance, syncope and  seizures. The remainder of the review of systems is noncontributory.   Physical Exam: BP 126/77 (BP Location: Left Arm, Patient Position: Sitting, Cuff Size: Large)   Pulse 71   Temp 97.8 F (36.6 C) (Temporal)   Ht '5\' 10"'$  (1.778 m)   Wt 236 lb 14.4 oz (107.5 kg)   BMI 33.99 kg/m  GENERAL: The patient is AO x3, in no acute distress. Obese. HEENT: Head is normocephalic and atraumatic. EOMI are intact. Mouth is well hydrated and without lesions. NECK: Supple. No masses LUNGS: Clear to auscultation. No presence of rhonchi/wheezing/rales. Adequate chest expansion HEART: RRR, normal s1 and s2. ABDOMEN: mildly tender diffusely , no guarding, no peritoneal signs, and nondistended. BS +.has a mid ventral hernia. EXTREMITIES: Without any cyanosis, clubbing, rash, lesions or edema. NEUROLOGIC: AOx3, no focal motor deficit. SKIN: no jaundice, no rashes   Imaging/Labs: as above  I  personally reviewed and interpreted the available labs, imaging and endoscopic files.  Impression and Plan: Stephen Porter is a 67 y.o. male with PMH ulcerative colitis, depression, hyperlipidemia, bipolar disorder and chronic pain on opiates, who presents to establish care for his history of ulcerative colitis and for evaluation of constipation.  Patient has presented recurrent episodes of intermittent constipation 3 years, which I believe has been primarily driven by his chronic opiate use.  He is not taking any laxatives on a regular basis, I advised him to start taking MiraLAX and uptitrate as needed.  If this fails to lead to significant improvement of his symptoms, can try prescription laxatives.  In terms of his ulcerative colitis, it is unclear for me if he indeed has this diagnosis as it is very unusual to not presenting flares of conservative colitis while being off treatment for so many years.  In fact, his initial presentation was not classic for ulcerative colitis as he was probably presenting  constipation.  No management changes will be performed at this moment but as he is due for colonoscopy given his history of colon polyps, we will also inspect his colon and terminal ileum at that time.  - Schedule colonoscopy - Start taking Miralax 1 capful every day for one week. If bowel movements do not improve, increase to 2 capfuls every day. If after two weeks there is no improvement, increase to 2 capfuls in AM and one at night.  All questions were answered.      Stephen Peppers, MD Gastroenterology and Hepatology [facility]

## 2022-10-29 NOTE — Patient Instructions (Signed)
Schedule colonoscopy Start taking Miralax 1 capful every day for one week. If bowel movements do not improve, increase to 2 capfuls every day. If after two weeks there is no improvement, increase to 2 capfuls in AM and one at night.

## 2022-10-31 DIAGNOSIS — I10 Essential (primary) hypertension: Secondary | ICD-10-CM | POA: Diagnosis not present

## 2022-10-31 DIAGNOSIS — Z8719 Personal history of other diseases of the digestive system: Secondary | ICD-10-CM | POA: Diagnosis not present

## 2022-10-31 DIAGNOSIS — F32A Depression, unspecified: Secondary | ICD-10-CM | POA: Diagnosis not present

## 2022-10-31 DIAGNOSIS — F1120 Opioid dependence, uncomplicated: Secondary | ICD-10-CM | POA: Diagnosis not present

## 2022-10-31 DIAGNOSIS — G959 Disease of spinal cord, unspecified: Secondary | ICD-10-CM | POA: Diagnosis not present

## 2022-10-31 DIAGNOSIS — Z299 Encounter for prophylactic measures, unspecified: Secondary | ICD-10-CM | POA: Diagnosis not present

## 2022-11-01 LAB — COMPREHENSIVE METABOLIC PANEL
AG Ratio: 2.2 (calc) (ref 1.0–2.5)
ALT: 32 U/L (ref 9–46)
AST: 25 U/L (ref 10–35)
Albumin: 4.3 g/dL (ref 3.6–5.1)
Alkaline phosphatase (APISO): 88 U/L (ref 35–144)
BUN: 12 mg/dL (ref 7–25)
CO2: 26 mmol/L (ref 20–32)
Calcium: 9 mg/dL (ref 8.6–10.3)
Chloride: 109 mmol/L (ref 98–110)
Creat: 1.01 mg/dL (ref 0.70–1.35)
Globulin: 2 g/dL (calc) (ref 1.9–3.7)
Glucose, Bld: 93 mg/dL (ref 65–99)
Potassium: 4.6 mmol/L (ref 3.5–5.3)
Sodium: 143 mmol/L (ref 135–146)
Total Bilirubin: 0.6 mg/dL (ref 0.2–1.2)
Total Protein: 6.3 g/dL (ref 6.1–8.1)

## 2022-11-01 LAB — CBC WITH DIFFERENTIAL/PLATELET
Absolute Monocytes: 676 cells/uL (ref 200–950)
Basophils Absolute: 39 cells/uL (ref 0–200)
Basophils Relative: 0.4 %
Eosinophils Absolute: 225 cells/uL (ref 15–500)
Eosinophils Relative: 2.3 %
HCT: 43.9 % (ref 38.5–50.0)
Hemoglobin: 15 g/dL (ref 13.2–17.1)
Lymphs Abs: 1715 cells/uL (ref 850–3900)
MCH: 29.9 pg (ref 27.0–33.0)
MCHC: 34.2 g/dL (ref 32.0–36.0)
MCV: 87.6 fL (ref 80.0–100.0)
MPV: 12.2 fL (ref 7.5–12.5)
Monocytes Relative: 6.9 %
Neutro Abs: 7144 cells/uL (ref 1500–7800)
Neutrophils Relative %: 72.9 %
Platelets: 226 10*3/uL (ref 140–400)
RBC: 5.01 10*6/uL (ref 4.20–5.80)
RDW: 12.7 % (ref 11.0–15.0)
Total Lymphocyte: 17.5 %
WBC: 9.8 10*3/uL (ref 3.8–10.8)

## 2022-11-01 NOTE — Addendum Note (Signed)
Addended by: Harvel Quale on: 11/01/2022 03:09 PM   Modules accepted: Level of Service

## 2022-11-19 ENCOUNTER — Telehealth (INDEPENDENT_AMBULATORY_CARE_PROVIDER_SITE_OTHER): Payer: Self-pay | Admitting: *Deleted

## 2022-11-19 DIAGNOSIS — R220 Localized swelling, mass and lump, head: Secondary | ICD-10-CM | POA: Diagnosis not present

## 2022-11-19 DIAGNOSIS — F319 Bipolar disorder, unspecified: Secondary | ICD-10-CM | POA: Diagnosis not present

## 2022-11-19 NOTE — Telephone Encounter (Signed)
Received call from patient he needed to reschedule procedure for 2/28 due to tooth infection. He has been moved to 3/13 at 12pm.

## 2022-11-22 ENCOUNTER — Encounter: Payer: Self-pay | Admitting: Radiology

## 2022-11-27 DIAGNOSIS — Z299 Encounter for prophylactic measures, unspecified: Secondary | ICD-10-CM | POA: Diagnosis not present

## 2022-11-27 DIAGNOSIS — G8929 Other chronic pain: Secondary | ICD-10-CM | POA: Diagnosis not present

## 2022-11-27 DIAGNOSIS — I10 Essential (primary) hypertension: Secondary | ICD-10-CM | POA: Diagnosis not present

## 2022-11-27 DIAGNOSIS — M549 Dorsalgia, unspecified: Secondary | ICD-10-CM | POA: Diagnosis not present

## 2022-12-05 ENCOUNTER — Encounter (HOSPITAL_COMMUNITY)
Admission: RE | Disposition: A | Discharge status: Home / Self Care | Payer: Self-pay | Source: Home / Self Care | Attending: Gastroenterology

## 2022-12-05 ENCOUNTER — Ambulatory Visit (HOSPITAL_COMMUNITY): Payer: PPO | Admitting: Anesthesiology

## 2022-12-05 ENCOUNTER — Encounter (HOSPITAL_COMMUNITY): Payer: Self-pay | Admitting: Gastroenterology

## 2022-12-05 ENCOUNTER — Ambulatory Visit (HOSPITAL_BASED_OUTPATIENT_CLINIC_OR_DEPARTMENT_OTHER): Payer: PPO | Admitting: Anesthesiology

## 2022-12-05 ENCOUNTER — Other Ambulatory Visit: Payer: Self-pay

## 2022-12-05 ENCOUNTER — Encounter (INDEPENDENT_AMBULATORY_CARE_PROVIDER_SITE_OTHER): Payer: Self-pay | Admitting: *Deleted

## 2022-12-05 ENCOUNTER — Ambulatory Visit (HOSPITAL_COMMUNITY)
Admission: RE | Admit: 2022-12-05 | Discharge: 2022-12-05 | Disposition: A | Discharge status: Home / Self Care | Payer: PPO | Attending: Gastroenterology | Admitting: Gastroenterology

## 2022-12-05 DIAGNOSIS — Z79891 Long term (current) use of opiate analgesic: Secondary | ICD-10-CM | POA: Diagnosis not present

## 2022-12-05 DIAGNOSIS — D123 Benign neoplasm of transverse colon: Secondary | ICD-10-CM | POA: Diagnosis not present

## 2022-12-05 DIAGNOSIS — Z86010 Personal history of colonic polyps: Secondary | ICD-10-CM | POA: Insufficient documentation

## 2022-12-05 DIAGNOSIS — E785 Hyperlipidemia, unspecified: Secondary | ICD-10-CM | POA: Insufficient documentation

## 2022-12-05 DIAGNOSIS — Z1211 Encounter for screening for malignant neoplasm of colon: Secondary | ICD-10-CM | POA: Insufficient documentation

## 2022-12-05 DIAGNOSIS — D122 Benign neoplasm of ascending colon: Secondary | ICD-10-CM | POA: Diagnosis not present

## 2022-12-05 DIAGNOSIS — Z79899 Other long term (current) drug therapy: Secondary | ICD-10-CM | POA: Diagnosis not present

## 2022-12-05 DIAGNOSIS — Z87891 Personal history of nicotine dependence: Secondary | ICD-10-CM | POA: Insufficient documentation

## 2022-12-05 DIAGNOSIS — K635 Polyp of colon: Secondary | ICD-10-CM

## 2022-12-05 DIAGNOSIS — G8929 Other chronic pain: Secondary | ICD-10-CM | POA: Diagnosis not present

## 2022-12-05 DIAGNOSIS — F419 Anxiety disorder, unspecified: Secondary | ICD-10-CM | POA: Diagnosis not present

## 2022-12-05 DIAGNOSIS — F319 Bipolar disorder, unspecified: Secondary | ICD-10-CM | POA: Diagnosis not present

## 2022-12-05 HISTORY — PX: COLONOSCOPY WITH PROPOFOL: SHX5780

## 2022-12-05 HISTORY — PX: POLYPECTOMY: SHX5525

## 2022-12-05 LAB — HM COLONOSCOPY

## 2022-12-05 SURGERY — COLONOSCOPY WITH PROPOFOL
Anesthesia: General

## 2022-12-05 MED ORDER — LACTATED RINGERS IV SOLN: INTRAVENOUS | Status: DC

## 2022-12-05 MED ORDER — PROPOFOL 500 MG/50ML IV EMUL
INTRAVENOUS | Status: DC | PRN
  Administered 2022-12-05: 200 ug/kg/min via INTRAVENOUS

## 2022-12-05 MED ORDER — LIDOCAINE HCL (CARDIAC) PF 100 MG/5ML IV SOSY
PREFILLED_SYRINGE | INTRAVENOUS | Status: DC | PRN
  Administered 2022-12-05: 60 mg via INTRATRACHEAL

## 2022-12-05 MED ORDER — LACTATED RINGERS IV SOLN: INTRAVENOUS | Status: DC | PRN

## 2022-12-05 MED ORDER — PROPOFOL 10 MG/ML IV BOLUS
INTRAVENOUS | Status: DC | PRN
  Administered 2022-12-05: 100 mg via INTRAVENOUS

## 2022-12-05 MED ORDER — STERILE WATER FOR IRRIGATION IR SOLN
Status: DC | PRN
  Administered 2022-12-05: 120 mL

## 2022-12-05 NOTE — Transfer of Care (Signed)
Immediate Anesthesia Transfer of Care Note  Patient: Stephen Porter  Procedure(s) Performed: COLONOSCOPY WITH PROPOFOL POLYPECTOMY  Patient Location: Short Stay  Anesthesia Type:General  Level of Consciousness: sedated  Airway & Oxygen Therapy: Patient Spontanous Breathing  Post-op Assessment: Report given to RN and Post -op Vital signs reviewed and stable  Post vital signs: Reviewed and stable  Last Vitals:  Vitals Value Taken Time  BP 104/52   Temp 98   Pulse 60   Resp 16   SpO2 100     Last Pain:  Vitals:   12/05/22 1146  TempSrc:   PainSc: 6       Patients Stated Pain Goal: 4 ([id number])  Complications: No notable events documented.

## 2022-12-05 NOTE — Op Note (Signed)
[facility] Patient Name: Stephen Porter Procedure Date: 12/05/2022 11:40 AM MRN: [id number] Date of Birth: 26-Aug-1956 Attending MD: [name] , , [id number] CSN: [id number] Age: 67 Admit Type: Outpatient Procedure:                Colonoscopy Indications:              Surveillance: Personal history of adenomatous                            polyps on last colonoscopy > 5 years ago Providers:                [name], Caprice Kluver, [place] Theda Sers RN,                            RN, Aram Candela Referring MD:              Medicines:                Monitored Anesthesia Care Complications:            No immediate complications. Estimated Blood Loss:     Estimated blood loss: none. Procedure:                Pre-Anesthesia Assessment:                           - Prior to the procedure, a History and Physical                            was performed, and patient medications, allergies                            and sensitivities were reviewed. The patient's                            tolerance of previous anesthesia was reviewed.                           - The risks and benefits of the procedure and the                            sedation options and risks were discussed with the                            patient. All questions were answered and informed                            consent was obtained.                           - ASA Grade Assessment: II - A patient with mild                            systemic disease.                           After obtaining informed consent, the  colonoscope                            was passed under direct vision. Throughout the                            procedure, the patient's blood pressure, pulse, and                            oxygen saturations were monitored continuously. The                            [id number] scope was introduced through                            the anus and advanced to the the terminal  ileum.                            The colonoscopy was performed without difficulty.                            The patient tolerated the procedure well. The                            quality of the bowel preparation was good. Scope In: 11:50:08 AM Scope Out: 12:13:43 PM Scope Withdrawal Time: 0 hours 20 minutes 35 seconds  Total Procedure Duration: 0 hours 23 minutes 35 seconds  Findings:      The perianal and digital rectal examinations were normal.      The terminal ileum appeared normal.      Six sessile polyps were found in the transverse colon and ascending       colon. The polyps were 3 to 10 mm in size. These polyps were removed       with a cold snare. Resection and retrieval were complete.      The retroflexed view of the distal rectum and anal verge was normal and       showed no anal or rectal abnormalities.      Note: no presence of inflammation in the TI or in the entire colon. It       is unlikely the patient has UC. Impression:               - Six 3 to 10 mm polyps in the transverse colon and                            in the ascending colon, removed with a cold snare.                            Resected and retrieved.                           - The distal rectum and anal verge are normal on                            retroflexion view. Moderate Sedation:  Per Anesthesia Care Recommendation:           - Discharge patient to home (ambulatory).                           - Resume previous diet.                           - Await pathology results.                           - Repeat colonoscopy in 3 years for surveillance. Procedure Code(s):        --- Professional ---                           [phone], Colonoscopy, flexible; with removal of                            tumor(s), polyp(s), or other lesion(s) by snare                            technique Diagnosis Code(s):        --- Professional ---                           Z86.010, Personal history of colonic polyps                            D12.3, Benign neoplasm of transverse colon (hepatic                            flexure or splenic flexure)                           D12.2, Benign neoplasm of ascending colon CPT copyright 2022 American Medical Association. All rights reserved. The codes documented in this report are preliminary and upon coder review may  be revised to meet current compliance requirements. [name], MD [name],  12/05/2022 12:18:45 PM This report has been signed electronically. Number of Addenda: 0

## 2022-12-05 NOTE — H&P (Signed)
Stephen Porter is an 67 y.o. male.   Chief Complaint: History of colon polyps HPI: Stephen Porter is a 67 y.o. male with PMH ulcerative colitis, depression, hyperlipidemia, bipolar disorder and chronic pain on opiates, who presents for history of colon polyps  The patient denies having any nausea, vomiting, fever, chills, hematochezia, melena, hematemesis, abdominal distention, abdominal pain, diarrhea, jaundice, pruritus or weight loss.  Last Colonoscopy: 2015 1.  Sessile polyp was found in the ascending colon; polypectomy was performed using snare cautery 2.  Sessile polyp was found at the hepatic flexure; polypectomy was performed using snare cautery 3.  Single polyp was found at the hepatic flexure; Destruction of lesion via ablation was attempted 4.  Small external hemorrhoids 5.  Both polyps were submitted together   Path: tubular adenomas   Recommended to repeat colonoscopy in 5 years.  Past Medical History:  Diagnosis Date   Bipolar disorder ([place])    DDD (degenerative disc disease)    Depression    Hyperlipidemia    Insomnia    Jejunal ulcer    Leukocytosis 04/12/2016   Skin rash     Past Surgical History:  Procedure Laterality Date   BACK SURGERY     CERVICAL SPINE SURGERY     x3   CHOLECYSTECTOMY     COLONOSCOPY  2006   COLONOSCOPY N/A 06/30/2014   Procedure: COLONOSCOPY;  Surgeon: Rogene Houston, MD;  [facility]: [facility];  Service: Endoscopy;  Laterality: N/A;  38   LUMBAR SPINE SURGERY     UPPER GASTROINTESTINAL ENDOSCOPY      Family History  Problem Relation Age of Onset   Hypertension Sister    Anxiety disorder Sister    Hypertension Brother    Hypertension Brother    Hypertension Brother    Dementia Mother    Paranoid behavior Mother    Dementia Father    Alcohol abuse Paternal Uncle    Drug abuse Paternal Uncle    ADD / ADHD Neg Hx    Depression Neg Hx    OCD Neg Hx    Schizophrenia Neg Hx    Seizures Neg Hx    Sexual abuse Neg Hx     Physical abuse Neg Hx    Colon cancer Neg Hx    Social History:  reports that he has quit smoking. His smoking use included cigarettes. He has a 40.00 pack-year smoking history. He uses smokeless tobacco. He reports that he does not drink alcohol and does not use drugs.  Allergies:  Allergies  Allergen Reactions   Elavil [Amitriptyline] Other (See Comments)    Made the patient "shaky"    Medications Prior to Admission  Medication Sig Dispense Refill   amLODipine (NORVASC) 5 MG tablet Take 5 mg by mouth at bedtime.      ARIPiprazole (ABILIFY) 5 MG tablet Take 5 mg by mouth daily.     ezetimibe (ZETIA) 10 MG tablet Take 1 tablet (10 mg total) by mouth daily. (Patient taking differently: Take 10 mg by mouth at bedtime.) 30 tablet 3   fentaNYL (DURAGESIC - DOSED MCG/HR) 50 MCG/HR Apply 50 mcg/hr topically every 3 (three) days.     FLUoxetine (PROZAC) 40 MG capsule TAKE ONE CAPSULE BY MOUTH EVERY DAY 90 capsule 0   ibuprofen (ADVIL) 200 MG tablet Take 400-600 mg by mouth every 6 (six) hours as needed for mild pain or headache.     lamoTRIgine (LAMICTAL) 100 MG tablet TAKE 1 TABLET BY MOUTH TWICE  DAILY 60 tablet 3    No results found for this or any previous visit (from the past 48 hour(s)). No results found.  Review of Systems  All other systems reviewed and are negative.   Blood pressure 137/77, pulse 72, temperature 98 F (36.7 C), temperature source Oral, height '5\' 10"'$  (1.778 m), weight 106.6 kg, SpO2 95 %. Physical Exam  GENERAL: The patient is AO x3, in no acute distress. HEENT: Head is normocephalic and atraumatic. EOMI are intact. Mouth is well hydrated and without lesions. NECK: Supple. No masses LUNGS: Clear to auscultation. No presence of rhonchi/wheezing/rales. Adequate chest expansion HEART: RRR, normal s1 and s2. ABDOMEN: Soft, nontender, no guarding, no peritoneal signs, and nondistended. BS +. No masses.  XTREMITIES: Without any cyanosis, clubbing, rash, lesions  or edema. NEUROLOGIC: AOx3, no focal motor deficit. SKIN: no jaundice, no rashes  Assessment/Plan Stephen Porter is a 67 y.o. male with PMH ulcerative colitis, depression, hyperlipidemia, bipolar disorder and chronic pain on opiates, who presents for history of colon polyps.  Will proceed with colonoscopy.  Harvel Quale, MD 12/05/2022, 10:31 AM

## 2022-12-05 NOTE — Discharge Instructions (Signed)
You are being discharged to home.  Resume your previous diet.  We are waiting for your pathology results.  Your physician has recommended a repeat colonoscopy in three years for surveillance.  

## 2022-12-05 NOTE — Anesthesia Preprocedure Evaluation (Signed)
Anesthesia Evaluation  Patient identified by MRN, date of birth, ID band Patient awake    Reviewed: Allergy & Precautions, H&P , NPO status , Patient's Chart, lab work & pertinent test results, reviewed documented beta blocker date and time   Airway Mallampati: II  TM Distance: >3 FB Neck ROM: full    Dental no notable dental hx.    Pulmonary neg pulmonary ROS, former smoker   Pulmonary exam normal breath sounds clear to auscultation       Cardiovascular Exercise Tolerance: Good negative cardio ROS  Rhythm:regular Rate:Normal     Neuro/Psych  PSYCHIATRIC DISORDERS Anxiety Depression Bipolar Disorder   TIAnegative neurological ROS  negative psych ROS   GI/Hepatic negative GI ROS, Neg liver ROS, PUD,,,  Endo/Other  negative endocrine ROS    Renal/GU negative Renal ROS  negative genitourinary   Musculoskeletal   Abdominal   Peds  Hematology negative hematology ROS (+)   Anesthesia Other Findings   Reproductive/Obstetrics negative OB ROS                             Anesthesia Physical Anesthesia Plan  ASA: 2  Anesthesia Plan: General   Post-op Pain Management:    Induction:   PONV Risk Score and Plan: Propofol infusion  Airway Management Planned:   Additional Equipment:   Intra-op Plan:   Post-operative Plan:   Informed Consent: I have reviewed the patients History and Physical, chart, labs and discussed the procedure including the risks, benefits and alternatives for the proposed anesthesia with the patient or authorized representative who has indicated his/her understanding and acceptance.     Dental Advisory Given  Plan Discussed with: CRNA  Anesthesia Plan Comments:        Anesthesia Quick Evaluation

## 2022-12-07 LAB — SURGICAL PATHOLOGY

## 2022-12-07 NOTE — Anesthesia Postprocedure Evaluation (Signed)
Anesthesia Post Note  Patient: RAUNEL UEHARA  Procedure(s) Performed: COLONOSCOPY WITH PROPOFOL POLYPECTOMY  Patient location during evaluation: Phase II Anesthesia Type: General Level of consciousness: awake Pain management: pain level controlled Vital Signs Assessment: post-procedure vital signs reviewed and stable Respiratory status: spontaneous breathing and respiratory function stable Cardiovascular status: blood pressure returned to baseline and stable Postop Assessment: no headache and no apparent nausea or vomiting Anesthetic complications: no Comments: Late entry   No notable events documented.   Last Vitals:  Vitals:   12/05/22 1216 12/05/22 1224  BP: (!) 92/46 (!) 92/52  Pulse: 60   Resp: 13   Temp: 36.6 C   SpO2: 95%     Last Pain:  Vitals:   12/05/22 1224  TempSrc:   PainSc: 0-No pain                 Louann Sjogren

## 2022-12-14 ENCOUNTER — Encounter (HOSPITAL_COMMUNITY): Payer: Self-pay | Admitting: Gastroenterology

## 2023-01-03 DIAGNOSIS — F1120 Opioid dependence, uncomplicated: Secondary | ICD-10-CM | POA: Diagnosis not present

## 2023-01-03 DIAGNOSIS — G8929 Other chronic pain: Secondary | ICD-10-CM | POA: Diagnosis not present

## 2023-01-03 DIAGNOSIS — M549 Dorsalgia, unspecified: Secondary | ICD-10-CM | POA: Diagnosis not present

## 2023-01-03 DIAGNOSIS — Z299 Encounter for prophylactic measures, unspecified: Secondary | ICD-10-CM | POA: Diagnosis not present

## 2023-01-03 DIAGNOSIS — I10 Essential (primary) hypertension: Secondary | ICD-10-CM | POA: Diagnosis not present

## 2023-02-06 DIAGNOSIS — G8929 Other chronic pain: Secondary | ICD-10-CM | POA: Diagnosis not present

## 2023-02-06 DIAGNOSIS — I10 Essential (primary) hypertension: Secondary | ICD-10-CM | POA: Diagnosis not present

## 2023-02-06 DIAGNOSIS — M549 Dorsalgia, unspecified: Secondary | ICD-10-CM | POA: Diagnosis not present

## 2023-02-06 DIAGNOSIS — Z299 Encounter for prophylactic measures, unspecified: Secondary | ICD-10-CM | POA: Diagnosis not present

## 2023-03-06 DIAGNOSIS — M549 Dorsalgia, unspecified: Secondary | ICD-10-CM | POA: Diagnosis not present

## 2023-03-06 DIAGNOSIS — G8929 Other chronic pain: Secondary | ICD-10-CM | POA: Diagnosis not present

## 2023-03-06 DIAGNOSIS — I10 Essential (primary) hypertension: Secondary | ICD-10-CM | POA: Diagnosis not present

## 2023-03-06 DIAGNOSIS — Z299 Encounter for prophylactic measures, unspecified: Secondary | ICD-10-CM | POA: Diagnosis not present

## 2023-03-08 IMAGING — MR MR HEAD W/O CM
13 of 15 series · 37 of 48 positions shown · non-contrast
Comparison: CT angiography same day.

CLINICAL DATA: Acute mental status changes.

EXAM:
MRI HEAD WITHOUT CONTRAST
TECHNIQUE: Multiplanar, multiecho pulse sequences of the brain and surrounding
structures were obtained without intravenous contrast.

[Series 5: DWI · axial · 3.0mm · 0.88mm/px · z∈[-102,+39]mm · 5 of 96 slices shown (1 of 4)]
[im 1/96]
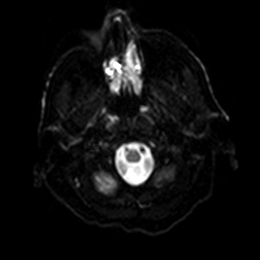
[im 24/96]
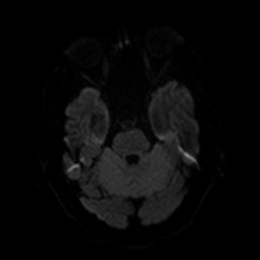
[im 48/96]
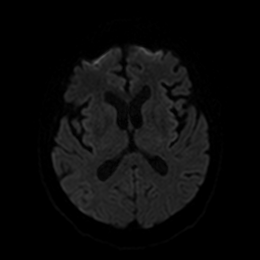
[im 72/96]
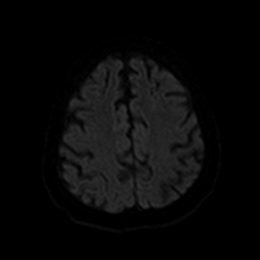
[im 96/96]
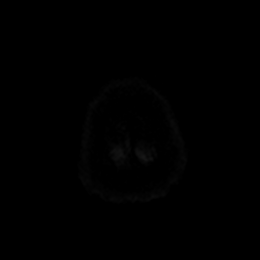

[Series 6: DWI · axial · 3.0mm · 0.88mm/px · z∈[-102,+39]mm · 3 of 48 slices shown (2 of 4)]
[im 1/48]
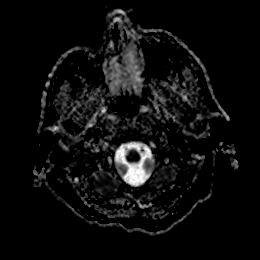
[im 24/48]
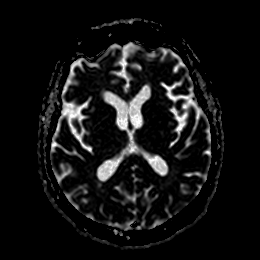
[im 48/48]
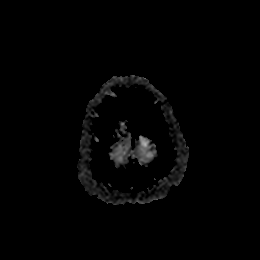

[Series 7: DWI · coronal · 4.0mm · 0.88mm/px · 4 of 70 slices shown (3 of 4)]
[im 1/70]
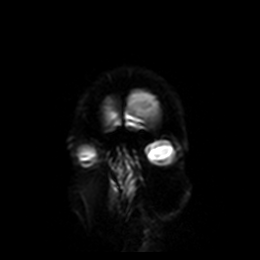
[im 24/70]
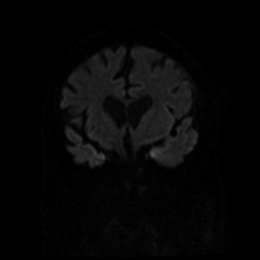
[im 47/70]
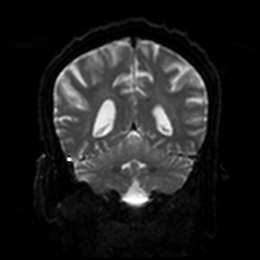
[im 70/70]
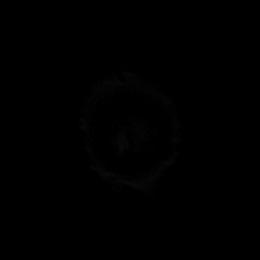

[Series 8: DWI · coronal · 4.0mm · 0.88mm/px · 2 of 35 slices shown (4 of 4)]
[im 1/35]
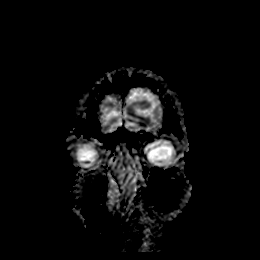
[im 35/35]
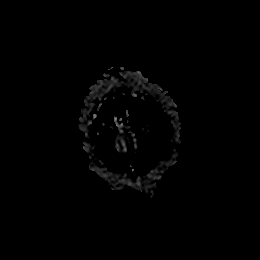

[Series 9: T1 · sagittal · 5.0mm · 0.75mm/px · 1 of 23 slices shown]
[im 1/23]
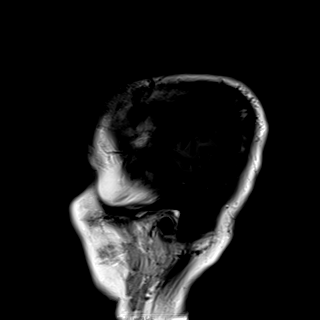

[Series 10: T2 · axial · 5.0mm · 0.72mm/px · z∈[-109,+47]mm · 2 of 27 slices shown (1 of 2)]
[im 1/27]
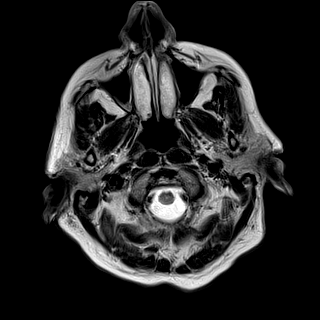
[im 27/27]
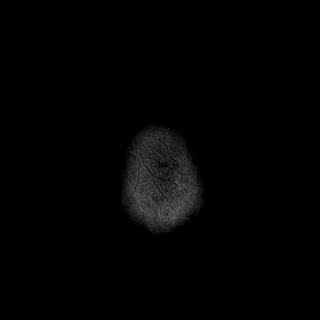

[Series 12: mag_images · axial · 3.0mm · 0.90mm/px · z∈[-117,+60]mm · 4 of 60 slices shown]
[im 1/60]
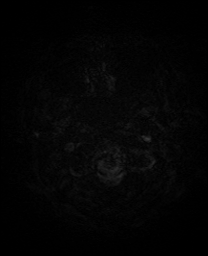
[im 20/60]
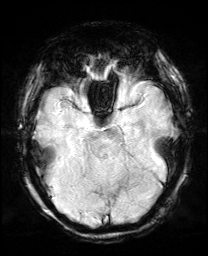
[im 40/60]
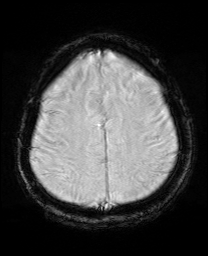
[im 60/60]
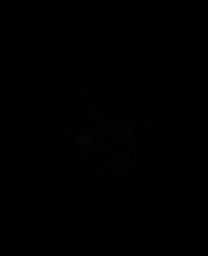

[Series 13: pha_images · axial · 3.0mm · 0.90mm/px · z∈[-117,+45]mm · 3 of 55 slices shown]
[im 1/55]
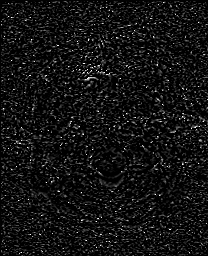
[im 28/55]
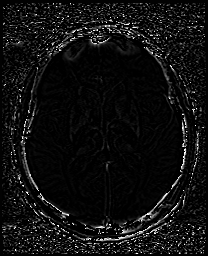
[im 55/55]
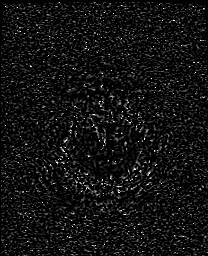

[Series 14: swi_images · axial · 3.0mm · 0.90mm/px · z∈[-117,+60]mm · 4 of 60 slices shown]
[im 1/60]
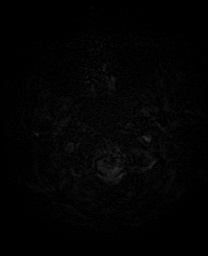
[im 20/60]
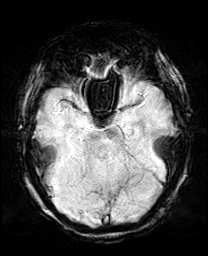
[im 40/60]
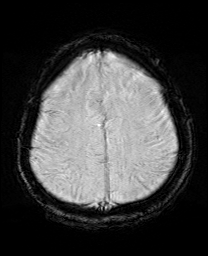
[im 60/60]
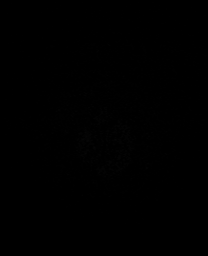

[Series 15: mip_images(sw) · axial · 24.0mm · 0.90mm/px · z∈[-106,+49]mm · 3 of 53 slices shown]
[im 1/53]
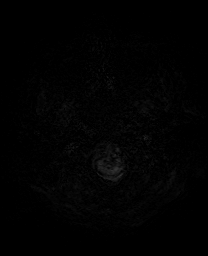
[im 27/53]
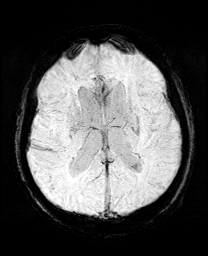
[im 53/53]
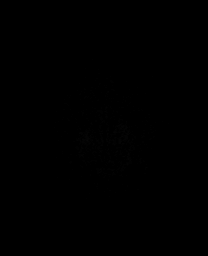

[Series 17: T2 · coronal · 5.0mm · 0.72mm/px · 2 of 29 slices shown (2 of 2)]
[im 1/29]
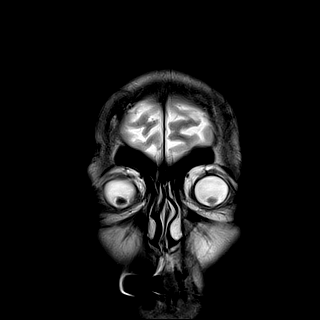
[im 29/29]
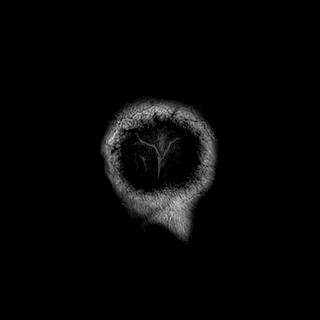

[Series 18: FLAIR · axial · 5.0mm · 0.90mm/px · z∈[-103,+41]mm · 2 of 25 slices shown (1 of 2)]
[im 1/25]
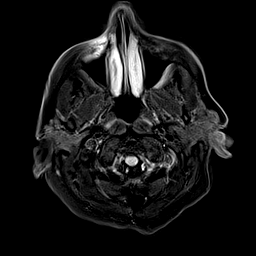
[im 25/25]
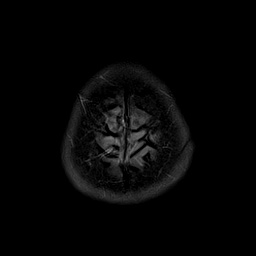

[Series 19: FLAIR · axial · 5.0mm · 0.90mm/px · z∈[-103,+41]mm · 2 of 25 slices shown (2 of 2)]
[im 1/25]
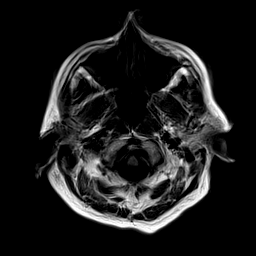
[im 25/25]
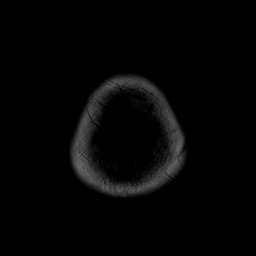

[37 of 48 positions shown; findings below may reference images not displayed]

FINDINGS: Brain: Diffusion imaging does not show any acute or subacute
infarction. No abnormality affects the brainstem or cerebellum.
Cerebral hemispheres show a very few scattered foci of T2 and FLAIR
signal within the hemispheric white matter, likely indicating the
earliest manifestation of small vessel change. No cortical or large
vessel territory infarction. No mass lesion, hemorrhage,
hydrocephalus or extra-axial collection.

Vascular: Major vessels at the base of the brain show flow.

Skull and upper cervical spine: Negative

Sinuses/Orbits: Sinuses are clear. Old medial orbital wall blowout
fracture on right.

Other: None
IMPRESSION: No acute finding. Minimal chronic small vessel change of the
cerebral hemispheric white.

## 2023-03-08 IMAGING — CT CT L SPINE W/O CM
3 of 4 series · 12 of 35 positions shown, 14 images · non-contrast
Comparison: MRI 08/09/2020

CLINICAL DATA: Low back pain. Previous surgery. Confusion.
Intermittent fevers.

EXAM:
CT LUMBAR SPINE WITHOUT CONTRAST
TECHNIQUE: Multidetector CT imaging of the lumbar spine was performed without
intravenous contrast administration. Multiplanar CT image
reconstructions were also generated.

[Series 5: sagittal bone · sagittal · 0.33mm/px · 5 of 61 slices shown, 6 images]
[im 21/61  bone]
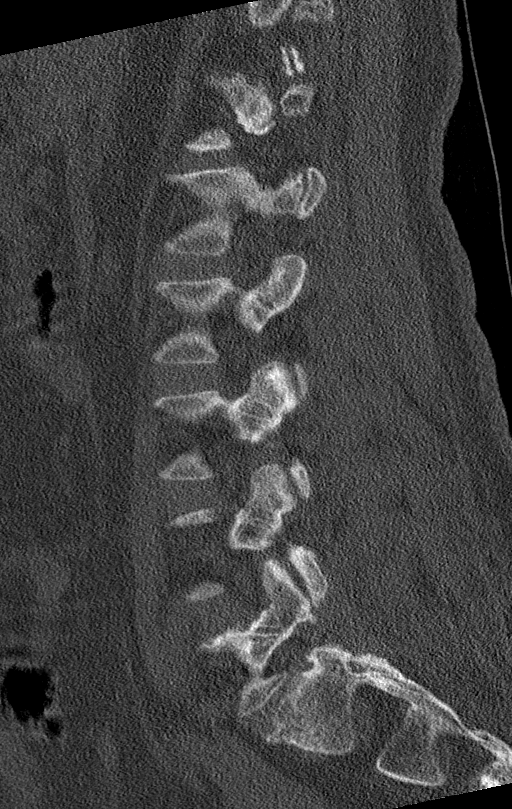
[im 26/61  bone]
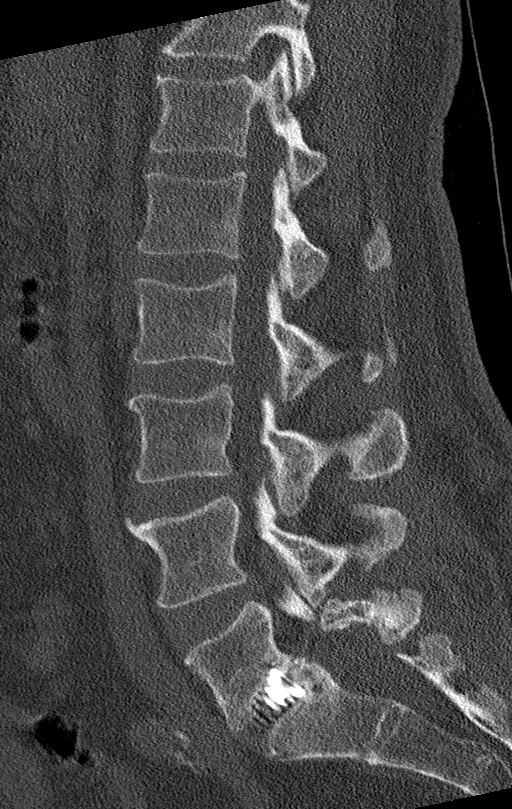
[im 31/61  soft-tissue]
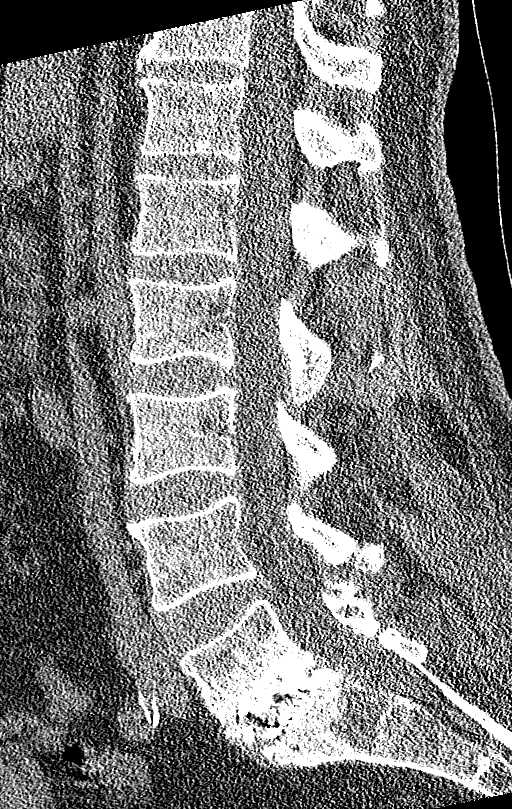
[im 31/61  bone]
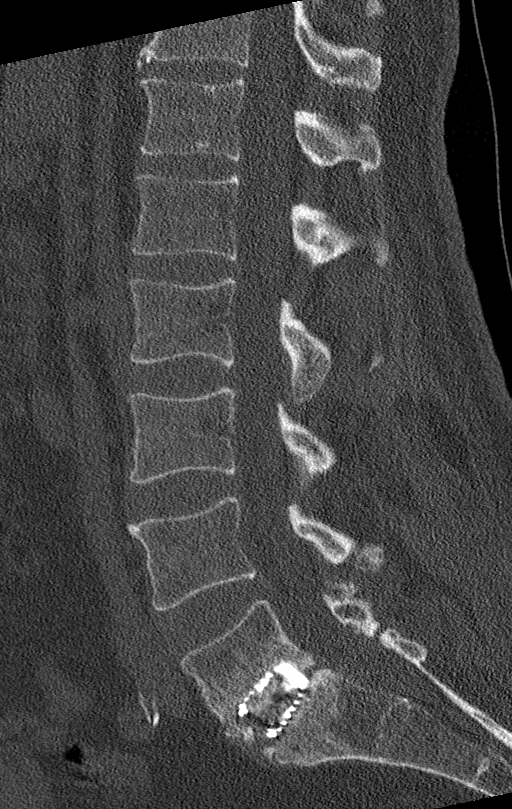
[im 36/61  bone]
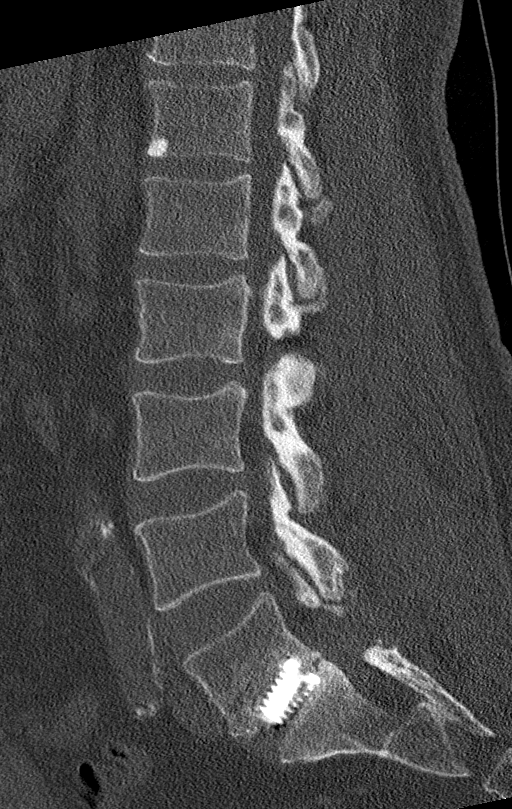
[im 41/61  bone]
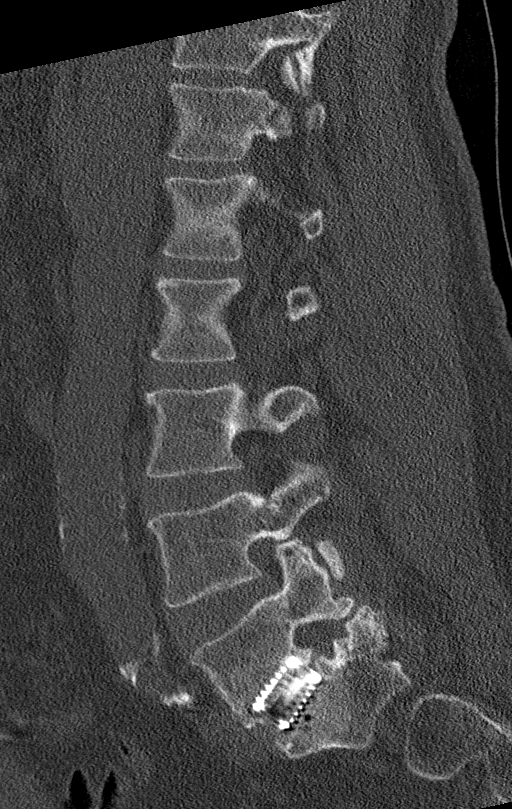

[Series 6: coronal bone · coronal · 0.39mm/px · 3 of 61 slices shown]
[im 13/61  bone]
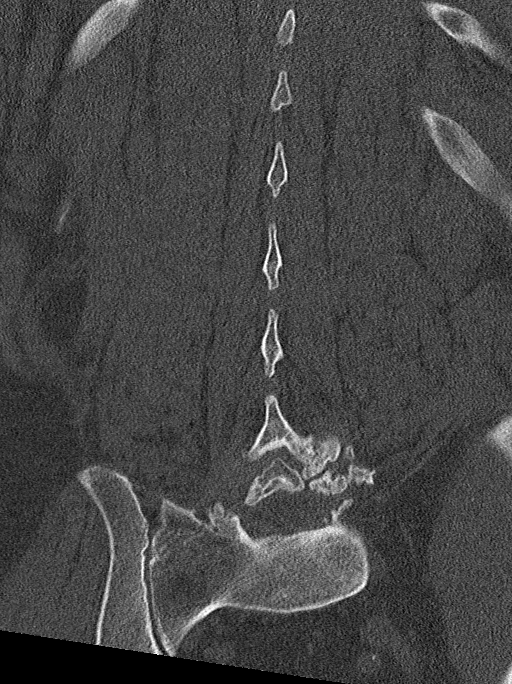
[im 25/61  bone]
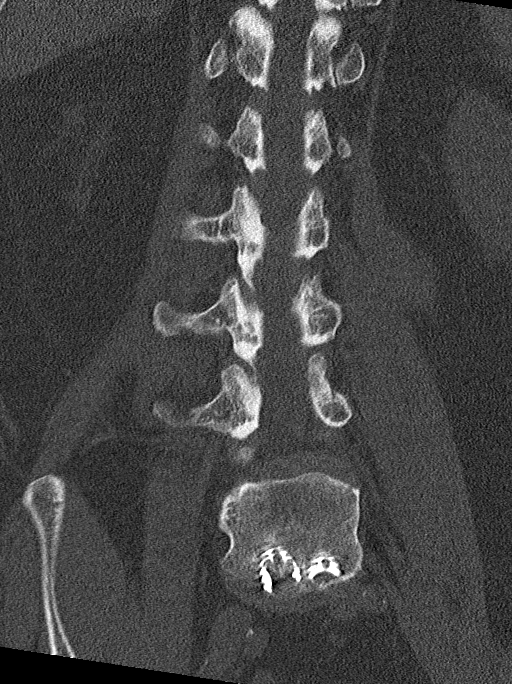
[im 37/61  bone]
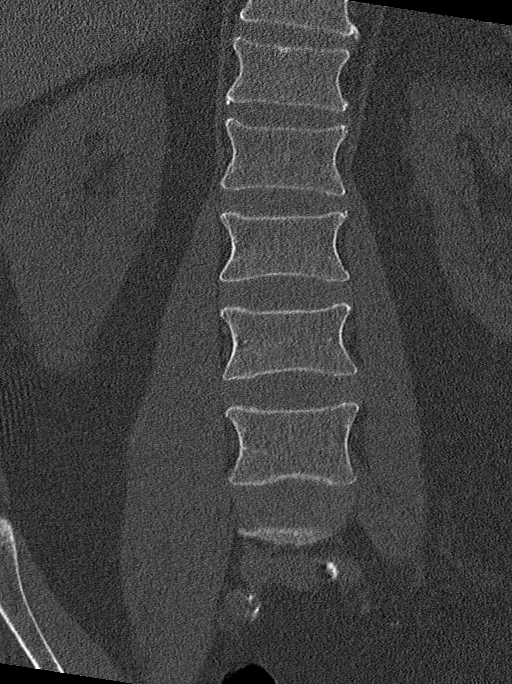

[Series 7: ax disc · axial · 0.21mm/px · z∈[+635,+814]mm · 4 of 124 slices shown, 5 images]
[im 21/124  soft-tissue]
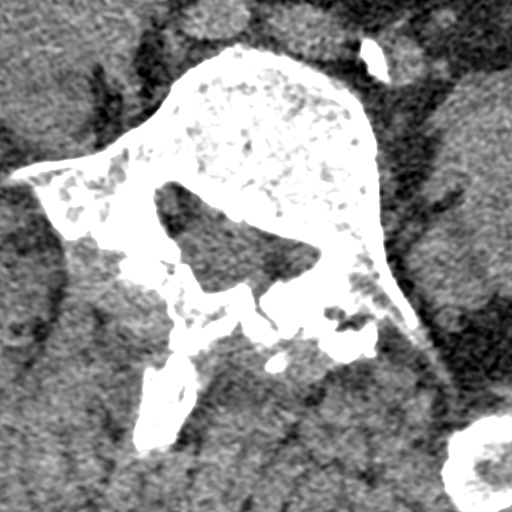
[im 21/124  bone]
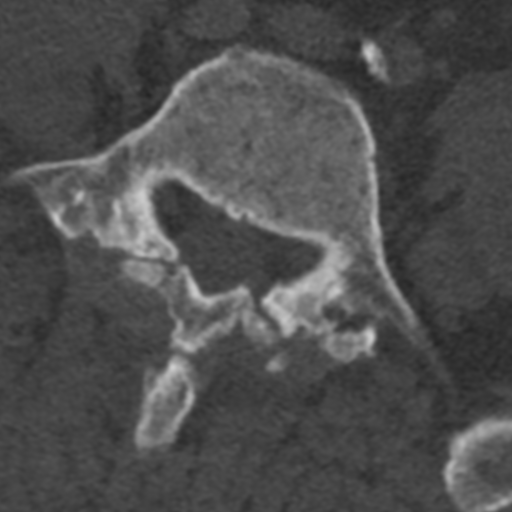
[im 42/124  bone]
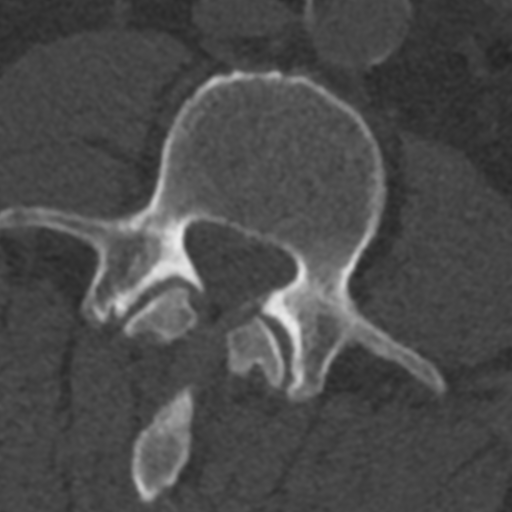
[im 83/124  bone]
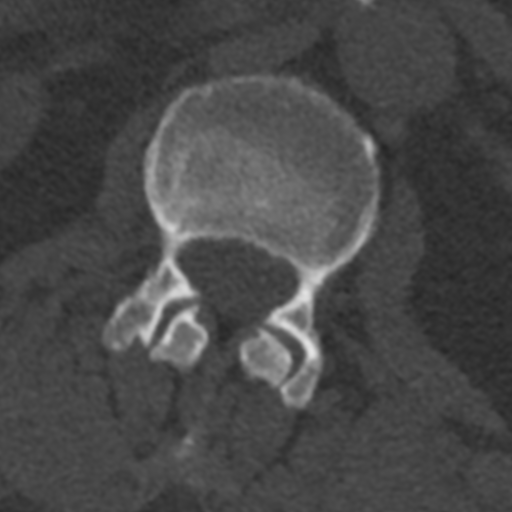
[im 103/124  bone]
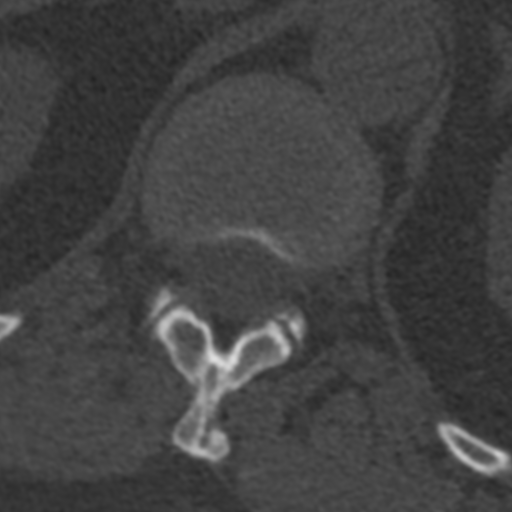

[12 of 35 positions shown; findings below may reference images not displayed]

FINDINGS: Segmentation: 5 lumbar type vertebral bodies as numbered previously.

Alignment: No malalignment.

Vertebrae: No fracture or primary bone lesion.

Paraspinal and other soft tissues: Negative

Disc levels: No significant finding from T11-12 through L3-4.

L4-5: Mild bulging of the disc. Bilateral facet degeneration and
hypertrophy. No apparent compressive stenosis. Findings could relate
to low back pain.

L5-S1: Previous discectomy and cage fusion. There is probably solid
union. No finding to suggest ongoing motion at this level.
Sufficient patency of the canal and foramina.
IMPRESSION: No acute finding by CT.

L4-5: Disc bulge. Facet osteoarthritis. No compressive stenosis.
Findings could relate to low back pain.

L5-S1: Distant posterior decompression, diskectomy and cage fusion.
Likely solid union with sufficient patency of the canal.

## 2023-03-26 ENCOUNTER — Ambulatory Visit (HOSPITAL_COMMUNITY): Payer: PPO | Admitting: Psychiatry

## 2023-04-05 IMAGING — CT CT RENAL STONE PROTOCOL
2 of 4 series · 16 of 46 positions shown, 18 images · non-contrast
Comparison: 12/01/2015

CLINICAL DATA: Flank pain. Kidney stones suspected. Bilateral flank
pain started yesterday.

EXAM:
CT ABDOMEN AND PELVIS WITHOUT CONTRAST
TECHNIQUE: Multidetector CT imaging of the abdomen and pelvis was performed
following the standard protocol without IV contrast.

[Series 2: axial st · axial · 0.88mm/px · z∈[-750,-345]mm · 13 of 95 slices shown, 15 images]
[im 7/95  soft-tissue]
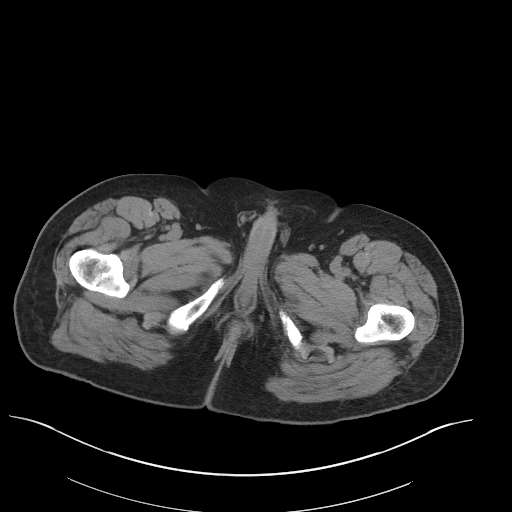
[im 7/95  bone]
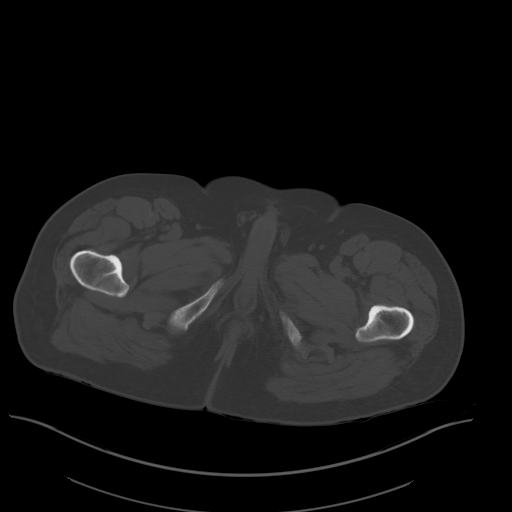
[im 13/95  soft-tissue]
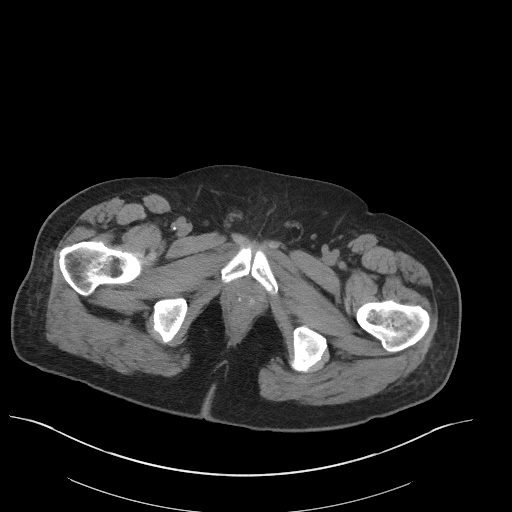
[im 19/95  soft-tissue]
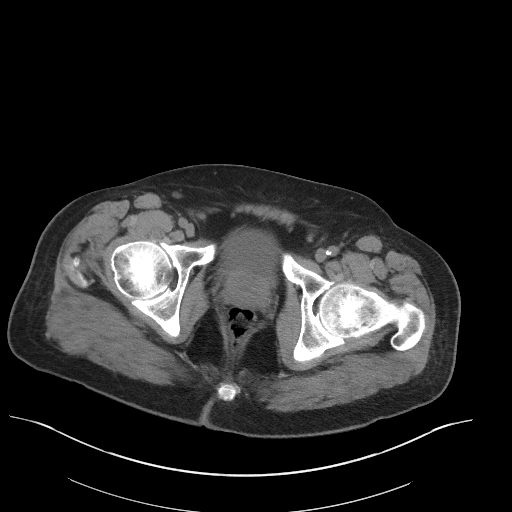
[im 26/95  soft-tissue]
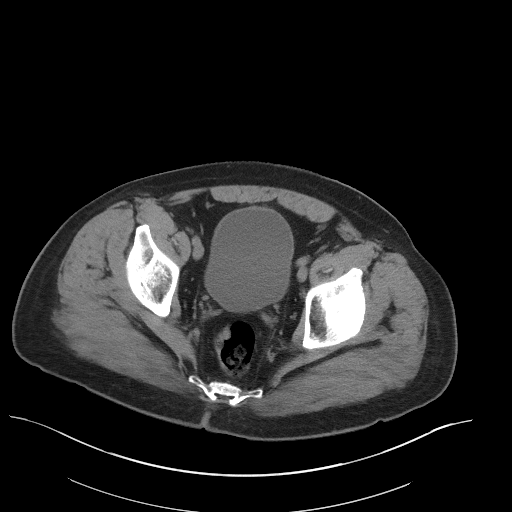
[im 32/95  soft-tissue]
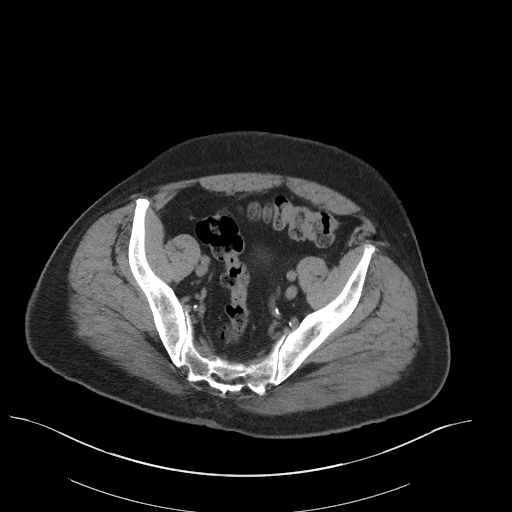
[im 38/95  soft-tissue]
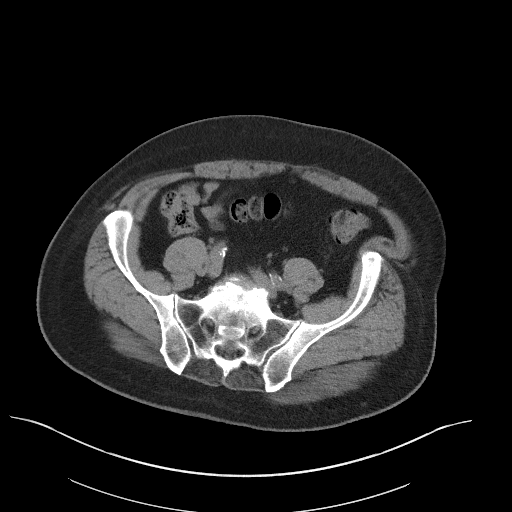
[im 51/95  soft-tissue]
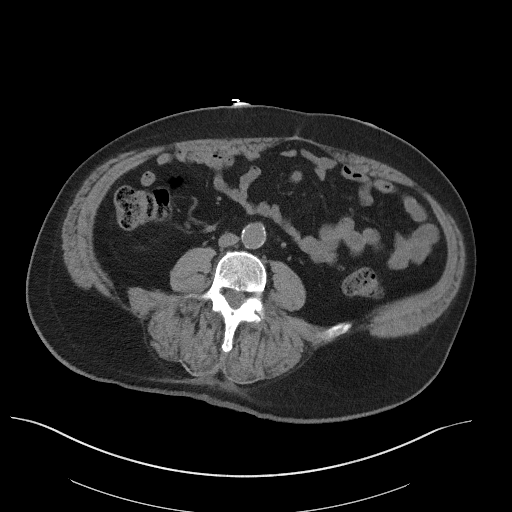
[im 57/95  soft-tissue]
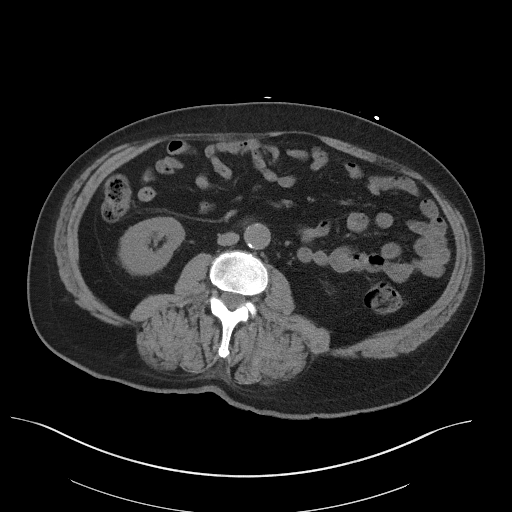
[im 63/95  soft-tissue]
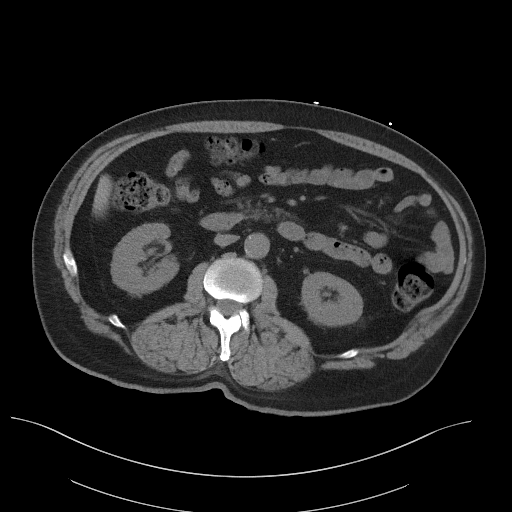
[im 63/95  bone]
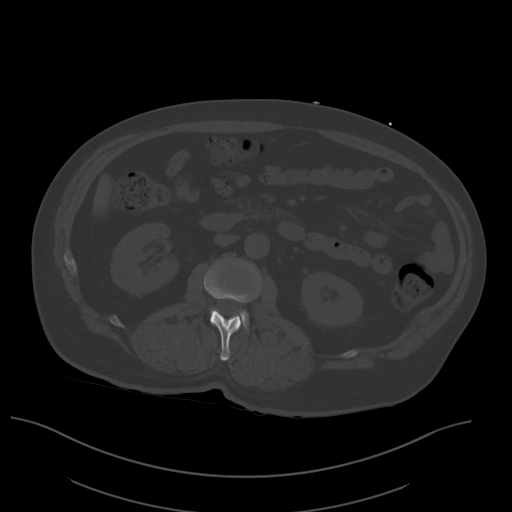
[im 69/95  soft-tissue]
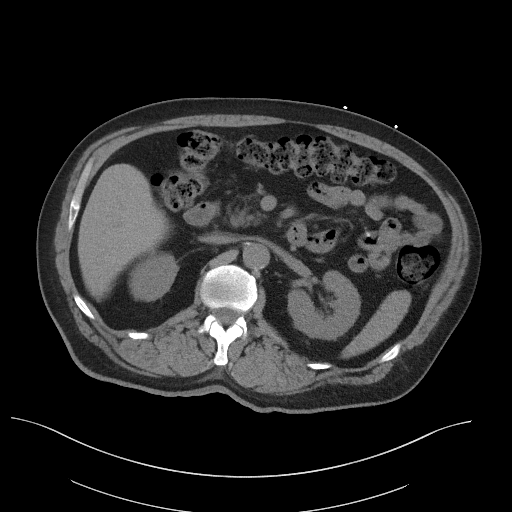
[im 76/95  soft-tissue]
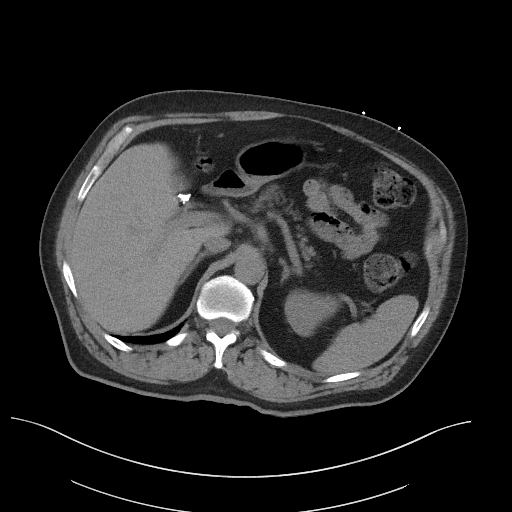
[im 82/95  soft-tissue]
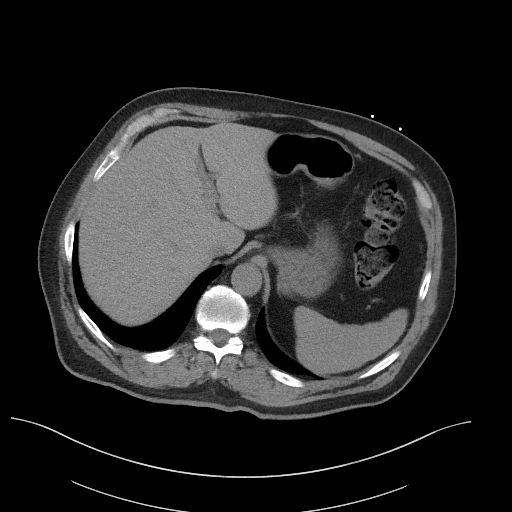
[im 88/95  soft-tissue]
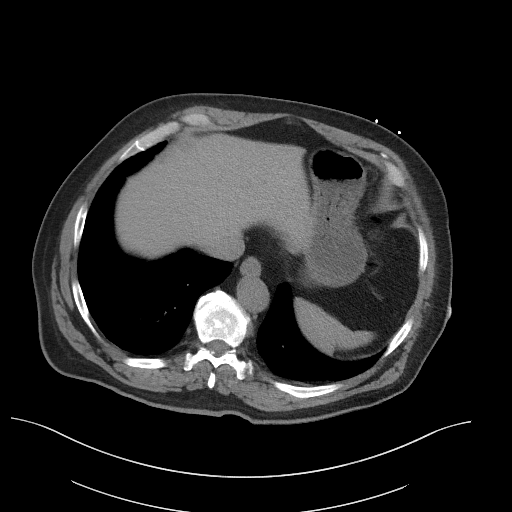

[Series 5: coronal st · coronal · 0.82mm/px · 3 of 105 slices shown]
[im 35/105  soft-tissue]
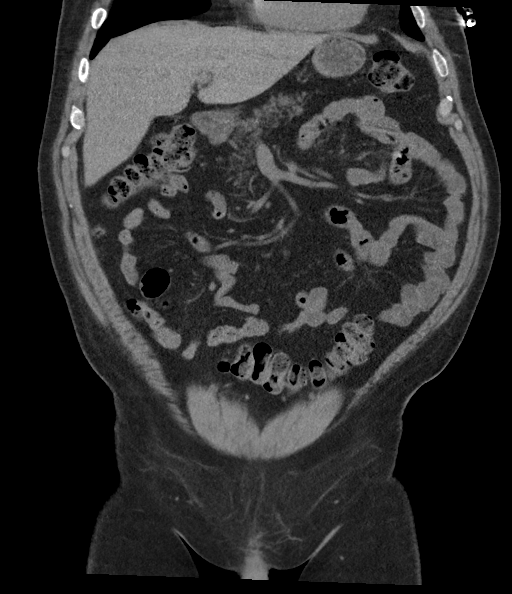
[im 47/105  soft-tissue]
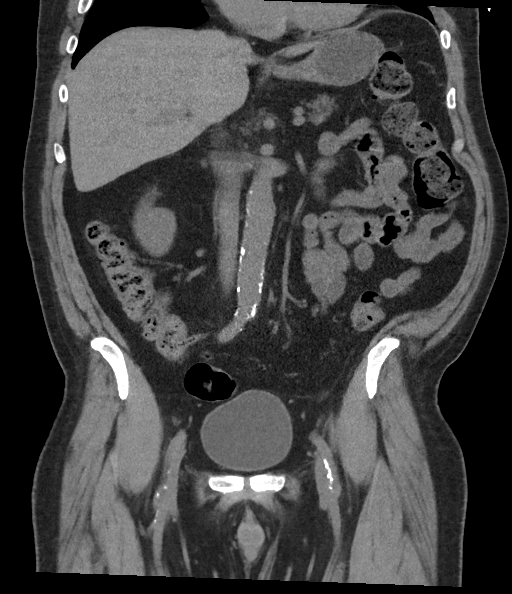
[im 58/105  soft-tissue]
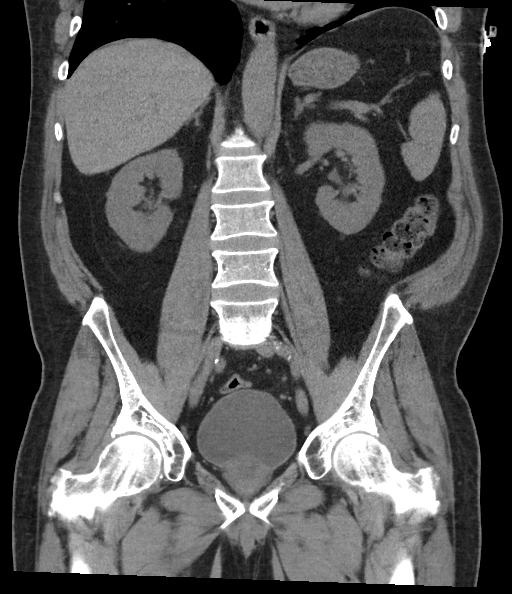

[16 of 46 positions shown; findings below may reference images not displayed]

FINDINGS: Lower chest: No acute abnormality.

Hepatobiliary: Cholecystectomy.  Normal appearance of the liver.

Pancreas: Unremarkable. No pancreatic ductal dilatation or
surrounding inflammatory changes.

Spleen: Normal in size without focal abnormality.

Adrenals/Urinary Tract: Normal adrenal glands.

Symmetric bilateral kidneys. No hydronephrosis or suspicious renal
mass. No nephrolithiasis. Ureters are unremarkable.

The bladder and visualized portion of the urethra are normal.

Stomach/Bowel: Stomach and small bowel loops are normal in
appearance. The appendix is well seen and has a normal appearance.
Significant stool burden in normal appearing loops of large bowel.

Vascular/Lymphatic: There is minimal atherosclerotic calcification
of the abdominal aorta. No aneurysm. No retroperitoneal or
mesenteric adenopathy.

Reproductive: Prostate is unremarkable.

Other: Small fat containing paraumbilical hernia.  No ascites.

Musculoskeletal: Mild degenerative changes in the LOWER lumbar
spine. Ray cages at L5-S1. No lytic or blastic lesions.
IMPRESSION: 1. No acute abnormality of the abdomen or pelvis.
2. No nephrolithiasis or urinary tract abnormality.
3. Moderate stool burden.
4.  Aortic atherosclerosis.  ([id number])
5. Cholecystectomy.

## 2023-04-15 DIAGNOSIS — Z299 Encounter for prophylactic measures, unspecified: Secondary | ICD-10-CM | POA: Diagnosis not present

## 2023-04-15 DIAGNOSIS — I10 Essential (primary) hypertension: Secondary | ICD-10-CM | POA: Diagnosis not present

## 2023-04-15 DIAGNOSIS — E7800 Pure hypercholesterolemia, unspecified: Secondary | ICD-10-CM | POA: Diagnosis not present

## 2023-04-15 DIAGNOSIS — Z7189 Other specified counseling: Secondary | ICD-10-CM | POA: Diagnosis not present

## 2023-04-15 DIAGNOSIS — Z79899 Other long term (current) drug therapy: Secondary | ICD-10-CM | POA: Diagnosis not present

## 2023-04-15 DIAGNOSIS — Z1331 Encounter for screening for depression: Secondary | ICD-10-CM | POA: Diagnosis not present

## 2023-04-15 DIAGNOSIS — Z125 Encounter for screening for malignant neoplasm of prostate: Secondary | ICD-10-CM | POA: Diagnosis not present

## 2023-04-15 DIAGNOSIS — R5383 Other fatigue: Secondary | ICD-10-CM | POA: Diagnosis not present

## 2023-04-15 DIAGNOSIS — Z1339 Encounter for screening examination for other mental health and behavioral disorders: Secondary | ICD-10-CM | POA: Diagnosis not present

## 2023-04-15 DIAGNOSIS — Z0000 Encounter for general adult medical examination without abnormal findings: Secondary | ICD-10-CM | POA: Diagnosis not present

## 2023-04-18 ENCOUNTER — Ambulatory Visit (HOSPITAL_COMMUNITY): Payer: PPO | Admitting: Psychiatry

## 2023-04-18 ENCOUNTER — Encounter (HOSPITAL_COMMUNITY): Payer: Self-pay | Admitting: Psychiatry

## 2023-04-18 DIAGNOSIS — F411 Generalized anxiety disorder: Secondary | ICD-10-CM

## 2023-04-18 DIAGNOSIS — F5104 Psychophysiologic insomnia: Secondary | ICD-10-CM

## 2023-04-18 DIAGNOSIS — G894 Chronic pain syndrome: Secondary | ICD-10-CM

## 2023-04-18 DIAGNOSIS — Z79891 Long term (current) use of opiate analgesic: Secondary | ICD-10-CM | POA: Diagnosis not present

## 2023-04-18 DIAGNOSIS — F410 Panic disorder [episodic paroxysmal anxiety] without agoraphobia: Secondary | ICD-10-CM | POA: Diagnosis not present

## 2023-04-18 DIAGNOSIS — F3342 Major depressive disorder, recurrent, in full remission: Secondary | ICD-10-CM

## 2023-04-18 DIAGNOSIS — Z79899 Other long term (current) drug therapy: Secondary | ICD-10-CM | POA: Insufficient documentation

## 2023-04-18 DIAGNOSIS — Z87891 Personal history of nicotine dependence: Secondary | ICD-10-CM | POA: Diagnosis not present

## 2023-04-18 HISTORY — DX: Other long term (current) drug therapy: Z79.899

## 2023-04-18 MED ORDER — ARIPIPRAZOLE 5 MG PO TABS: 2.5000 mg | ORAL_TABLET | Freq: Every day | ORAL | 2 refills | Status: DC

## 2023-04-18 MED ORDER — FLUOXETINE HCL 40 MG PO CAPS: 40.0000 mg | ORAL_CAPSULE | Freq: Every day | ORAL | 2 refills | Status: DC

## 2023-04-18 MED ORDER — LAMOTRIGINE 150 MG PO TABS: 150.0000 mg | ORAL_TABLET | Freq: Every evening | ORAL | 2 refills | Status: DC

## 2023-04-18 NOTE — Progress Notes (Signed)
Psychiatric Initial Adult Assessment  Patient Identification: Stephen Porter MRN:  [id number] Date of Evaluation:  04/18/2023 Referral Source: PCP  Assessment:  Stephen Porter is a 67 y.o. male with a history of generalized anxiety disorder with panic attacks, major depressive disorder in full remission, long-term current use of antipsychotic, psychophysiologic insomnia with snoring, chronic back pain on long-term opiate therapy, history of cocaine/stimulant/cannabis use disorder in sustained remission, history of long-term benzodiazepine use requiring hospitalized detox who presents to [facility] via video conferencing for initial evaluation of medication management.  Patient over the years under the various care of the different [place] psychiatry providers.  He was trialed on a number of different benzodiazepines, SSRIs and SNRIs, mood stabilizing medications and was previously a patient for psychotherapy through the [place].  Ultimately the regimen that he found the most benefit from was lamotrigine 150 mg daily, Abilify 5 mg daily, fluoxetine 40 mg daily.  It should be noted that for most of the SSRI and SNRI medications that were trials he noted a positive benefit from.  He also had to undergo medical detox from prescription benzodiazepines after requiring higher and higher doses of the medication.  Similar occurrence with prior prescription opiates after 4 back surgeries and 2 neck surgeries.  Patient established care with Dr. Adrian Blackwater on 04/18/2023.  Patient reported overall well controlled symptoms of depression and would consider to be admission at this point.  Main symptom remained anxiety which was generalized and had panic attacks.  As such she would likely benefit from titration of fluoxetine however with noting binge eating episodes and significant weight gain in the last year or so we will plan on decreasing Abilify first and consolidating  lamotrigine dosing as outlined in plan below.  Not interested in psychotherapy at this time.  Follow-up in 1 month.  For safety, his acute risk factors for suicide are: Long-term opiate use, unemployed.  His chronic risk factors for suicide are: Long-term mental illness, chronic opiate use, chronic pain, history of substance use, history of heavier alcohol use.  His protective factors are: Beloved pets, supportive family and friends, actively seeking and engaging with mental health care, no suicidal ideation and visits today, no access to firearms.  While future events cannot be predicted he does not currently meet IVC criteria and can be continued as an outpatient.  Plan:  # Generalized anxiety disorder with panic attacks Past medication trials:  Status of problem: New to provider Interventions: -- Continue fluoxetine 40 mg daily for now with plan to titrate in the future  # Recurrent major depressive disorder in full remission Past medication trials:  Status of problem: New to provider Interventions: -- Decrease Abilify to 2.5 mg once daily (d7/25/24) -- Consolidate lamotrigine dose to 150 mg nightly   # Psychophysiologic insomnia with snoring Past medication trials:  Status of problem: New to provider Interventions: -- Lamotrigine as above --Consider sleep study through PCP  # Chronic back pain on disability with long-term opiate therapy Past medication trials:  Status of problem: New to provider Interventions: -- Continue with pain management --Continue fentanyl patch for pain management  # Long-term current use of antipsychotic Past medication trials:  Status of problem: New to provider Interventions: -- Coordinate with PCP for updated EKG, lipid panel, A1c  # History of tobacco/cannabis/cocaine/speed/benzodiazepine dependence in sustained remission Past medication trials:  Status of problem: New to provider Interventions: -- Continue to encourage abstinence  Patient  was given contact  information for behavioral health clinic and was instructed to call 911 for emergencies.   Subjective:  Chief Complaint:  Chief Complaint  Patient presents with   Anxiety   Establish Care    History of Present Illness:  Goes by Stephen Porter. His PCP had been taking care of his medication, abilify, lamotrigine, and fluoxetine. They all appear to be working and not giving any issue. Has self weaned the lamotrigine to 100mg  daily with 50mg  nightly.  Lives with wife and no pets. Two daughters who are grown with 4 grandkids. Everyone getting along. Retired from Therapist, sports and tobacco work. Mostly doing stuff with his grandchildren and taking care of home/yard. Still enjoys. Sleeps well, a lot better than he used to. Wakes to urinate which he thinks is normal for his age. Snores from time to time with no sleep. Likes coffee and drinks at least 2 per day and usually drinks sips of the third. They are larger cups. Hips bother him but no restless legs. Has vivid dreams every night and dreams with naps. Appetite is good, maybe more of late with slight weight gain in the last 3 months; has gained 30lbs in 1 year. Binge episodes happen weekly, sometimes more frequent and uncomfortably full. Denies compensation or restriction. Thinks maybe was referred for gaining weight and knows abilify can do that. Denies purging and works out outside of immediacy after meals. Has been more limited with 4 back surgeries and 2 neck surgeries. Stays constipated with fentanyl and abilify. Concentration adequate. Fidgety. Struggles with guilt feelings which are ambiguated. No SI at present but was more of an issue a couple times in the past had a plan but never acted. Would have been with a gun. Was over 10 years ago and was hospitalized for 3-4 days.   Chronic worry across multiple domains with impact on sleep and muscle tension. Panic attacks happen every day there about. Worry around finances which drives panic.  Remembers growing up poor. Longest period of sleeplessness was 2-3 days. Was combination of energy and strong urge to sleep. Thinks it was related to fentanyl patch and worry; hasn't been an issue in the last year. Chronic project starter even when sleeping well, no hyperspending, no hypersexuality (actually went down), no grandiosity, no talkativeness. No hallucinations. No paranoia.   No alcohol currently. When younger drank beer and liquors at times. Has drunk to passing out when younger. No complicated withdrawal when cutting back. Used to use tobacco but quit in 2016. No other drugs currently; marijuana (smoked), cocaine (snorted), speed (oral). Had to go to rehab to get off the oxycodone from his back surgeries because he became dependent. Had to also have detox in hospital from prescription xanax.   Past Psychiatric History:  Diagnoses: Depression and anxiety Medication trials: fluoextine (effective), abilify (effective), lamotrigine (effective), lexapro (effective), cymbalta (effective), pristiq (doesn't remember), seroquel (doesn't remember), xanax (effective but escalating dose), librium, ativan, diazepam Previous psychiatrist/therapist: Dr. Tenny Craw Hospitalizations: prior to 2014 hospitalization for SI with plan Suicide attempts: none SIB: none Hx of violence towards others: none Current access to guns: none Hx of trauma/abuse: none  Previous Psychotropic Medications: Yes   Substance Abuse History in the last 12 months:  No.  Past Medical History:  Past Medical History:  Diagnosis Date   Bipolar disorder (HCC)    DDD (degenerative disc disease)    Depression    Hyperlipidemia    Insomnia    Jejunal ulcer    Leukocytosis 04/12/2016   Leukocytosis  04/12/2016   Skin rash     Past Surgical History:  Procedure Laterality Date   BACK SURGERY     CERVICAL SPINE SURGERY     x3   CHOLECYSTECTOMY     COLONOSCOPY  2006   COLONOSCOPY N/A 06/30/2014   Procedure: COLONOSCOPY;   Surgeon: Malissa Hippo, MD;  [facility];  Service: Endoscopy;  Laterality: N/A;  830   COLONOSCOPY WITH PROPOFOL N/A 12/05/2022   Procedure: COLONOSCOPY WITH PROPOFOL;  Surgeon: Dolores Frame, MD;  Location: [facility];  Service: Gastroenterology;  Laterality: N/A;  915am, asa 1-2   LUMBAR SPINE SURGERY     POLYPECTOMY  12/05/2022   Procedure: POLYPECTOMY;  Surgeon: Marguerita Merles, Reuel Boom, MD;  Location: [facility];  Service: Gastroenterology;;   UPPER GASTROINTESTINAL ENDOSCOPY      Family Psychiatric History: as below  Family History:  Family History  Problem Relation Age of Onset   Hypertension Sister    Anxiety disorder Sister    Hypertension Brother    Hypertension Brother    Hypertension Brother    Dementia Mother    Paranoid behavior Mother    Dementia Father    Alcohol abuse Paternal Uncle    Drug abuse Paternal Uncle    ADD / ADHD Neg Hx    Depression Neg Hx    OCD Neg Hx    Schizophrenia Neg Hx    Seizures Neg Hx    Sexual abuse Neg Hx    Physical abuse Neg Hx    Colon cancer Neg Hx     Social History:   Academic/Vocational: retired  Social History   Socioeconomic History   Marital status: Married    Spouse name: Not on file   Number of children: Not on file   Years of education: Not on file   Highest education level: Not on file  Occupational History   Occupation: Fish farm manager: UNEMPLOYED  Tobacco Use   Smoking status: Former    Current packs/day: 1.00    Average packs/day: 1 pack/day for 40.0 years (40.0 ttl pk-yrs)    Types: Cigarettes   Smokeless tobacco: Former   Tobacco comments:    07-05-2016 Dip, 01-22-2017 per pt he stopped Sep 25, 2016  Vaping Use   Vaping status: Never Used  Substance and Sexual Activity   Alcohol use: No    Comment: 07-05-2016 per pt no   Drug use: No    Comment: 07-05-2016 per pt no and he stopped Marijuana 1 yr ago   Sexual activity: Not on file  Other Topics Concern   Not  on file  Social History Narrative   Married with 3 children and lives locally   No regular exercise   Social Determinants of Health   Financial Resource Strain: Not on file  Food Insecurity: Not on file  Transportation Needs: Not on file  Physical Activity: Not on file  Stress: Not on file  Social Connections: Not on file    Additional Social History: updated  Allergies:   Allergies  Allergen Reactions   Elavil [Amitriptyline] Other (See Comments)    Made the patient "shaky"    Current Medications: Current Outpatient Medications  Medication Sig Dispense Refill   amLODipine (NORVASC) 5 MG tablet Take 5 mg by mouth at bedtime.      ARIPiprazole (ABILIFY) 5 MG tablet Take 0.5 tablets (2.5 mg total) by mouth daily. 30 tablet 2   ezetimibe (ZETIA) 10 MG tablet  Take 1 tablet (10 mg total) by mouth daily. (Patient taking differently: Take 10 mg by mouth at bedtime.) 30 tablet 3   fentaNYL (DURAGESIC - DOSED MCG/HR) 50 MCG/HR Apply 50 mcg/hr topically every 3 (three) days.     FLUoxetine (PROZAC) 40 MG capsule Take 1 capsule (40 mg total) by mouth daily. 30 capsule 2   ibuprofen (ADVIL) 200 MG tablet Take 400-600 mg by mouth every 6 (six) hours as needed for mild pain or headache.     lamoTRIgine (LAMICTAL) 150 MG tablet Take 1 tablet (150 mg total) by mouth at bedtime. 30 tablet 2   No current facility-administered medications for this visit.    ROS: Review of Systems  Constitutional:  Positive for appetite change and unexpected weight change.  Gastrointestinal:  Positive for constipation. Negative for diarrhea, nausea and vomiting.  Endocrine: Positive for polyphagia. Negative for cold intolerance and heat intolerance.  Musculoskeletal:  Positive for arthralgias, back pain, gait problem and neck pain.  Skin:        No hair loss  Neurological:  Negative for dizziness and headaches.  Psychiatric/Behavioral:  Negative for decreased concentration, dysphoric mood, hallucinations,  self-injury, sleep disturbance and suicidal ideas. The patient is nervous/anxious.     Objective:  Psychiatric Specialty Exam: There were no vitals taken for this visit.There is no height or weight on file to calculate BMI.  General Appearance: Casual, Fairly Groomed, and wearing glasses.  Appears stated age  Eye Contact:  Good  Speech:  Clear and Coherent and Normal Rate  Volume:  Normal  Mood:   "I feel pretty good I think I just was referred for medication refills"  Affect:  Appropriate, Congruent, and overall euthymic.  Reports slight anxiety but not present in affect  Thought Content: Logical, Hallucinations: None, and Rumination over finances  Suicidal Thoughts:  No  Homicidal Thoughts:  No  Thought Process:  Coherent, Goal Directed, and Linear  Orientation:  Full (Time, Place, and Person)    Memory: Grossly intact   Judgment:  Fair  Insight:  Fair  Concentration:  Concentration: Fair and Attention Span: Fair  Recall:  not formally assessed   Fund of Knowledge: Fair  Language: Fair  Psychomotor Activity:  Normal  Akathisia:  No  AIMS (if indicated): Not done due to limits of telehealth  Assets:  Communication Skills Desire for Improvement Financial Resources/Insurance Housing Intimacy Leisure Time Resilience Social Support Talents/Skills Transportation  ADL's:  Impaired  Cognition: WNL  Sleep:  Fair   PE: General: sits comfortably in view of camera; no acute distress  Pulm: no increased work of breathing on room air  MSK: all extremity movements appear intact  Neuro: no focal neurological deficits observed  Gait & Station: unable to assess by video    Metabolic Disorder Labs: Lab Results  Component Value Date   HGBA1C 5.5 02/26/2021   MPG 111 02/26/2021   MPG 108.28 12/10/2018   No results found for: "PROLACTIN" Lab Results  Component Value Date   CHOL 129 02/28/2021   TRIG 126 02/28/2021   HDL 38 (L) 02/28/2021   CHOLHDL 3.4 02/28/2021   VLDL 25  02/28/2021   LDLCALC 66 02/28/2021   LDLCALC 109 (H) 12/10/2018   Lab Results  Component Value Date   TSH 1.082 02/26/2021    Therapeutic Level Labs: No results found for: "LITHIUM" No results found for: "CBMZ" No results found for: "VALPROATE"  Screenings:  PHQ2-9    Flowsheet Row Office Visit from 04/18/2023 in  [facility] Video Visit from 01/20/2021 in [facility] at [facility] Total Score 0 2  PHQ-9 Total Score 5 8      Flowsheet Row Office Visit from 04/18/2023 in [facility] at [place] Admission (Discharged) from 12/05/2022 in [facility] ED from 03/26/2021 in [facility] at [facility]  C-SSRS RISK CATEGORY No Risk No Risk No Risk       Collaboration of Care: Collaboration of Care: Medication Management AEB as above and Primary Care Provider AEB as above  Patient/Guardian was advised Release of Information must be obtained prior to any record release in order to collaborate their care with an outside provider. Patient/Guardian was advised if they have not already done so to contact the registration department to sign all necessary forms in order for [place] to release information regarding their care.   Consent: Patient/Guardian gives verbal consent for treatment and assignment of benefits for services provided during this visit. Patient/Guardian expressed understanding and agreed to proceed.   Televisit via video: I connected with [name] on 04/18/23 at 10:00 AM EDT by a video enabled telemedicine application and verified that I am speaking with the correct person using two identifiers.  Location: Patient: [place] at home Provider: home office   I discussed the limitations of evaluation and management by telemedicine and the availability of in person appointments. The patient expressed understanding and agreed to  proceed.  I discussed the assessment and treatment plan with the patient. The patient was provided an opportunity to ask questions and all were answered. The patient agreed with the plan and demonstrated an understanding of the instructions.   The patient was advised to call back or seek an in-person evaluation if the symptoms worsen or if the condition fails to improve as anticipated.  I provided 60 minutes dedicated to the care of this patient via video on the date of this encounter to include chart review, face-to-face time with the patient, medication management/counseling.  Elsie Lincoln, MD 7/25/20245:24 PM

## 2023-04-18 NOTE — Patient Instructions (Addendum)
We consolidated your lamotrigine to 150 mg nightly today.  We also decreased your Abilify to 2.5 mg daily today.  We will plan on increasing your fluoxetine in the future.  In the meantime please have your PCP send over the results of your most recent EKG, lipid panel, A1c for [place] to review.

## 2023-05-16 DIAGNOSIS — G8929 Other chronic pain: Secondary | ICD-10-CM | POA: Diagnosis not present

## 2023-05-16 DIAGNOSIS — I10 Essential (primary) hypertension: Secondary | ICD-10-CM | POA: Diagnosis not present

## 2023-05-16 DIAGNOSIS — M549 Dorsalgia, unspecified: Secondary | ICD-10-CM | POA: Diagnosis not present

## 2023-05-16 DIAGNOSIS — Z299 Encounter for prophylactic measures, unspecified: Secondary | ICD-10-CM | POA: Diagnosis not present

## 2023-05-16 DIAGNOSIS — G459 Transient cerebral ischemic attack, unspecified: Secondary | ICD-10-CM | POA: Diagnosis not present

## 2023-05-20 ENCOUNTER — Encounter (HOSPITAL_COMMUNITY): Payer: Self-pay | Admitting: Psychiatry

## 2023-05-20 ENCOUNTER — Telehealth (INDEPENDENT_AMBULATORY_CARE_PROVIDER_SITE_OTHER): Payer: PPO | Admitting: Psychiatry

## 2023-05-20 DIAGNOSIS — F411 Generalized anxiety disorder: Secondary | ICD-10-CM

## 2023-05-20 DIAGNOSIS — F3342 Major depressive disorder, recurrent, in full remission: Secondary | ICD-10-CM

## 2023-05-20 DIAGNOSIS — Z79899 Other long term (current) drug therapy: Secondary | ICD-10-CM

## 2023-05-20 DIAGNOSIS — F5104 Psychophysiologic insomnia: Secondary | ICD-10-CM

## 2023-05-20 DIAGNOSIS — R0683 Snoring: Secondary | ICD-10-CM | POA: Diagnosis not present

## 2023-05-20 DIAGNOSIS — F410 Panic disorder [episodic paroxysmal anxiety] without agoraphobia: Secondary | ICD-10-CM

## 2023-05-20 DIAGNOSIS — G8929 Other chronic pain: Secondary | ICD-10-CM | POA: Diagnosis not present

## 2023-05-20 DIAGNOSIS — G894 Chronic pain syndrome: Secondary | ICD-10-CM

## 2023-05-20 DIAGNOSIS — Z79891 Long term (current) use of opiate analgesic: Secondary | ICD-10-CM

## 2023-05-20 MED ORDER — FLUOXETINE HCL 40 MG PO CAPS
40.0000 mg | ORAL_CAPSULE | Freq: Every day | ORAL | 2 refills | Status: DC
Start: 2023-05-20 — End: 2023-07-22

## 2023-05-20 MED ORDER — LAMOTRIGINE 150 MG PO TABS: 150.0000 mg | ORAL_TABLET | Freq: Every evening | ORAL | 2 refills | Status: DC

## 2023-05-20 MED ORDER — ARIPIPRAZOLE 5 MG PO TABS
2.5000 mg | ORAL_TABLET | Freq: Every day | ORAL | 2 refills | Status: DC
Start: 2023-05-20 — End: 2023-07-22

## 2023-05-20 NOTE — Patient Instructions (Signed)
We did not make any medication changes today.  When he get a chance, please coordinate with your PCP to get a sleep study referral to rule out sleep apnea.

## 2023-05-20 NOTE — Progress Notes (Signed)
BH MD Outpatient Progress Note  05/20/2023 1:33 PM Stephen Porter  MRN:  [id number]  Assessment:  Stephen Porter presents for follow-up evaluation. Today, 05/20/23, patient reports stability of mood with decrease of Abilify to 2.5 mg daily.  He is still having a fair amount of increased appetite with weight gain but will maintain current dose for right now with plan on discontinuing likely at next visit.  Consider titration of Prozac but given that he is not having much in the way of anxiety or depression at this time did not have a clear target goal outside of increased appetite which should improve with discontinuation of Abilify at next visit to a certain degree.  Did not have a chance to follow up with PCP for sleep study referral so we will try to do that soon.  With planned discontinuation of Abilify will not look to repeat long-term antipsychotic monitoring. Not interested in psychotherapy at this time.  Follow-up in 1 month.   For safety, his acute risk factors for suicide are: Long-term opiate use, unemployed.  His chronic risk factors for suicide are: Long-term mental illness, chronic opiate use, chronic pain, history of substance use, history of heavier alcohol use.  His protective factors are: Beloved pets, supportive family and friends, actively seeking and engaging with mental health care, no suicidal ideation and visits today, no access to firearms.  While future events cannot be predicted he does not currently meet IVC criteria and can be continued as an outpatient.  Identifying Information: Stephen Porter is a 67 y.o. male with a history of generalized anxiety disorder with panic attacks, major depressive disorder in full remission, long-term current use of antipsychotic, psychophysiologic insomnia with snoring, chronic back pain on long-term opiate therapy, history of cocaine/stimulant/cannabis use disorder in sustained remission, history of long-term benzodiazepine use requiring  hospitalized detox who is an established patient with [facility]. Initial evaluation of medication management on 04/18/2023; please see that note for full case formulation.  Patient over the years under the various care of the different [place] psychiatry providers.  He was trialed on a number of different benzodiazepines, SSRIs and SNRIs, mood stabilizing medications and was previously a patient for psychotherapy through the [place] [facility].  Ultimately the regimen that he found the most benefit from was lamotrigine 150 mg daily, Abilify 5 mg daily, fluoxetine 40 mg daily.  It should be noted that for most of the SSRI and SNRI medications that were trials he noted a positive benefit from.  He also had to undergo medical detox from prescription benzodiazepines after requiring higher and higher doses of the medication.  Similar occurrence with prior prescription opiates after 4 back surgeries and 2 neck surgeries.  Patient established care with Dr. Adrian Blackwater on 04/18/2023.  Patient reported overall well controlled symptoms of depression and would consider to be admission at this point.  Main symptom remained anxiety which was generalized and had panic attacks.  As such she would likely benefit from titration of fluoxetine however with noting binge eating episodes and significant weight gain in the last year or so we will plan on decreasing Abilify first and consolidating lamotrigine dosing as outlined in plan below.     Plan:   # Generalized anxiety disorder with panic attacks Past medication trials:  Status of problem: Improving Interventions: -- Continue fluoxetine 40 mg daily    # Recurrent major depressive disorder in full remission Past medication trials:  Status of problem: Improve  Interventions: -- Continue Abilify 2.5 mg once daily (d7/25/24) with plan to discontinue at next appointment -- Continue lamotrigine dose 150 mg nightly    #  Psychophysiologic insomnia with snoring Past medication trials:  Status of problem: Chronic and stable Interventions: -- Lamotrigine as above --Consider sleep study through PCP   # Chronic back pain on disability with long-term opiate therapy Past medication trials:  Status of problem: Chronic with mild exacerbation Interventions: -- Continue with pain management --Continue fentanyl patch for pain management   # Long-term current use of antipsychotic Past medication trials:  Status of problem: Chronic and stable Interventions: -- Coordinate with PCP for updated EKG, lipid panel, A1c.  Though may not be necessary with plan discontinuation next appointment   # History of tobacco/cannabis/cocaine/speed/benzodiazepine dependence in sustained remission Past medication trials:  Status of problem: In remission Interventions: -- Continue to encourage abstinence  Patient was given contact information for behavioral health clinic and was instructed to call 911 for emergencies.   Subjective:  Chief Complaint:  Chief Complaint  Patient presents with   Follow-up    Interval History: Can't tell much difference with the decrease of abilify. Will plan on staying on this dose while making change to fluoxetine at 60mg  dose next as opposed to 80mg . Has been calm overall lately with no panic attacks so will actually continue medications as prescribed. Will see his PCP next in 2 months and will try to get sleep study at this point. Looking forward to seeing some baseball played by his grandson who is playing for 2 different teams. They were able to cut back on his fentanyl visits to every 2 months but still has chronic pain which limits his movement so keeps putting on weight.  Visit Diagnosis:    ICD-10-CM   1. Generalized anxiety disorder with panic attacks  F41.1 FLUoxetine (PROZAC) 40 MG capsule   F41.0     2. Recurrent major depressive disorder, in full remission (HCC)  F33.42 lamoTRIgine  (LAMICTAL) 150 MG tablet    FLUoxetine (PROZAC) 40 MG capsule    ARIPiprazole (ABILIFY) 5 MG tablet    3. Psychophysiologic insomnia with snoring  F51.04 lamoTRIgine (LAMICTAL) 150 MG tablet    4. Long term current use of antipsychotic medication  Z79.899     5. Chronic prescription opiate use  Z79.891     6. Chronic pain syndrome  G89.4       Past Psychiatric History:  Diagnoses: generalized anxiety disorder with panic attacks, major depressive disorder in full remission, long-term current use of antipsychotic, psychophysiologic insomnia with snoring, chronic back pain on long-term opiate therapy, history of cocaine/stimulant/cannabis use disorder in sustained remission, history of long-term benzodiazepine use requiring hospitalized detox Medication trials: fluoextine (effective), abilify (effective), lamotrigine (effective), lexapro (effective), cymbalta (effective), pristiq (doesn't remember), seroquel (doesn't remember), xanax (effective but escalating dose), librium, ativan, diazepam Previous psychiatrist/therapist: Dr. Tenny Craw Hospitalizations: prior to 2014 hospitalization for SI with plan Suicide attempts: none SIB: none Hx of violence towards others: none Current access to guns: none Hx of trauma/abuse: none Substance use: none currently. No alcohol currently. When younger drank beer and liquors at times. Has drunk to passing out when younger. No complicated withdrawal when cutting back. Used to use tobacco but quit in 2016. Previously marijuana (smoked), cocaine (snorted), speed (oral). Had to go to rehab to get off the oxycodone from his back surgeries because he became dependent. Had to also have detox in hospital from prescription xanax.   Past Medical  History:  Past Medical History:  Diagnosis Date   Bipolar disorder (HCC)    DDD (degenerative disc disease)    Depression    Hyperlipidemia    Insomnia    Jejunal ulcer    Leukocytosis 04/12/2016   Leukocytosis 04/12/2016    Psychoactive substance-induced organic mood disorder (HCC) 02/25/2012   Skin rash    Toxic encephalopathy 02/26/2021    Past Surgical History:  Procedure Laterality Date   BACK SURGERY     CERVICAL SPINE SURGERY     x3   CHOLECYSTECTOMY     COLONOSCOPY  2006   COLONOSCOPY N/A 06/30/2014   Procedure: COLONOSCOPY;  Surgeon: Malissa Hippo, MD;  [facility];  Service: Endoscopy;  Laterality: N/A;  830   COLONOSCOPY WITH PROPOFOL N/A 12/05/2022   Procedure: COLONOSCOPY WITH PROPOFOL;  Surgeon: Dolores Frame, MD;  Location: [facility];  Service: Gastroenterology;  Laterality: N/A;  915am, asa 1-2   LUMBAR SPINE SURGERY     POLYPECTOMY  12/05/2022   Procedure: POLYPECTOMY;  Surgeon: Marguerita Merles, Reuel Boom, MD;  Location: [facility];  Service: Gastroenterology;;   UPPER GASTROINTESTINAL ENDOSCOPY      Family Psychiatric History: as below  Family History:  Family History  Problem Relation Age of Onset   Hypertension Sister    Anxiety disorder Sister    Hypertension Brother    Hypertension Brother    Hypertension Brother    Dementia Mother    Paranoid behavior Mother    Dementia Father    Alcohol abuse Paternal Uncle    Drug abuse Paternal Uncle    ADD / ADHD Neg Hx    Depression Neg Hx    OCD Neg Hx    Schizophrenia Neg Hx    Seizures Neg Hx    Sexual abuse Neg Hx    Physical abuse Neg Hx    Colon cancer Neg Hx     Social History:  Academic/Vocational: on disability  Social History   Socioeconomic History   Marital status: Married    Spouse name: Not on file   Number of children: Not on file   Years of education: Not on file   Highest education level: Not on file  Occupational History   Occupation: Fish farm manager: UNEMPLOYED  Tobacco Use   Smoking status: Former    Current packs/day: 1.00    Average packs/day: 1 pack/day for 40.0 years (40.0 ttl pk-yrs)    Types: Cigarettes   Smokeless tobacco: Former   Tobacco  comments:    07-05-2016 Dip, 01-22-2017 per pt he stopped Sep 25, 2016  Vaping Use   Vaping status: Never Used  Substance and Sexual Activity   Alcohol use: No    Comment: 07-05-2016 per pt no   Drug use: No    Comment: 07-05-2016 per pt no and he stopped Marijuana 1 yr ago   Sexual activity: Not on file  Other Topics Concern   Not on file  Social History Narrative   Married with 3 children and lives locally   No regular exercise   Social Determinants of Health   Financial Resource Strain: Not on file  Food Insecurity: Not on file  Transportation Needs: Not on file  Physical Activity: Not on file  Stress: Not on file  Social Connections: Not on file    Allergies:  Allergies  Allergen Reactions   Elavil [Amitriptyline] Other (See Comments)    Made the patient "shaky"  Current Medications: Current Outpatient Medications  Medication Sig Dispense Refill   rosuvastatin (CRESTOR) 5 MG tablet Take 5 mg by mouth daily.     amLODipine (NORVASC) 5 MG tablet Take 5 mg by mouth at bedtime.      ARIPiprazole (ABILIFY) 5 MG tablet Take 0.5 tablets (2.5 mg total) by mouth daily. 30 tablet 2   fentaNYL (DURAGESIC - DOSED MCG/HR) 50 MCG/HR Apply 50 mcg/hr topically every 3 (three) days.     FLUoxetine (PROZAC) 40 MG capsule Take 1 capsule (40 mg total) by mouth daily. 30 capsule 2   ibuprofen (ADVIL) 200 MG tablet Take 400-600 mg by mouth every 6 (six) hours as needed for mild pain or headache.     lamoTRIgine (LAMICTAL) 150 MG tablet Take 1 tablet (150 mg total) by mouth at bedtime. 30 tablet 2   No current facility-administered medications for this visit.    ROS: Review of Systems  Constitutional:  Positive for appetite change and unexpected weight change.  Gastrointestinal:  Positive for constipation. Negative for diarrhea, nausea and vomiting.  Endocrine: Positive for polyphagia.  Musculoskeletal:  Positive for arthralgias, back pain, gait problem and neck pain.   Psychiatric/Behavioral:  Negative for decreased concentration, dysphoric mood, hallucinations, sleep disturbance and suicidal ideas. The patient is not nervous/anxious.     Objective:  Psychiatric Specialty Exam: There were no vitals taken for this visit.There is no height or weight on file to calculate BMI.  General Appearance: Casual, Fairly Groomed, and wearing glasses with beard. Appears stated age  Eye Contact:  Good  Speech:  Clear and Coherent and Normal Rate  Volume:  Normal  Mood:   "good, just normal life stuff"  Affect:  Appropriate, Congruent, and euthymic  Thought Content: Logical and Hallucinations: None   Suicidal Thoughts:  No  Homicidal Thoughts:  No  Thought Process:  Coherent, Goal Directed, and Linear  Orientation:  Full (Time, Place, and Person)    Memory:  Grossly intact   Judgment:  Fair  Insight:  Fair  Concentration:  Concentration: Fair  Recall:  not formally assessed   Fund of Knowledge: Fair  Language: Fair  Psychomotor Activity:  Normal  Akathisia:  No  AIMS (if indicated): not done  Assets:  Communication Skills Desire for Improvement Financial Resources/Insurance Housing Intimacy Leisure Time Resilience Social Support Talents/Skills Transportation  ADL's:  Impaired  Cognition: WNL  Sleep:  Fair   PE: General: sits comfortably in view of camera; no acute distress  Pulm: no increased work of breathing on room air  MSK: all extremity movements appear intact  Neuro: no focal neurological deficits observed  Gait & Station: unable to assess by video    Metabolic Disorder Labs: Lab Results  Component Value Date   HGBA1C 5.5 02/26/2021   MPG 111 02/26/2021   MPG 108.28 12/10/2018   No results found for: "PROLACTIN" Lab Results  Component Value Date   CHOL 129 02/28/2021   TRIG 126 02/28/2021   HDL 38 (L) 02/28/2021   CHOLHDL 3.4 02/28/2021   VLDL 25 02/28/2021   LDLCALC 66 02/28/2021   LDLCALC 109 (H) 12/10/2018   Lab  Results  Component Value Date   TSH 1.082 02/26/2021   TSH 0.959 02/23/2012    Therapeutic Level Labs: No results found for: "LITHIUM" No results found for: "VALPROATE" No results found for: "CBMZ"  Screenings:  PHQ2-9    Flowsheet Row Office Visit from 04/18/2023 in [facility] Video Visit from  [facility]/20/2021 in [facility] Total Score 0 2  PHQ-9 Total Score 5 8      Flowsheet Row Office Visit from 04/18/2023 in [facility] at [place] Admission (Discharged) from 12/05/2022 in [place] [facility] ED from 03/26/2021 in [facility] Department [facility]  C-SSRS RISK CATEGORY No Risk No Risk No Risk       Collaboration of Care: Collaboration of Care: Medication Management AEB as above and Primary Care Provider AEB as above  Patient/Guardian was advised Release of Information must be obtained prior to any record release in order to collaborate their care with an outside provider. Patient/Guardian was advised if they have not already done so to contact the registration department to sign all necessary forms in order for [place] to release information regarding their care.   Consent: Patient/Guardian gives verbal consent for treatment and assignment of benefits for services provided during this visit. Patient/Guardian expressed understanding and agreed to proceed.   Televisit via video: I connected with patient on 05/20/23 at  1:00 PM EDT by a video enabled telemedicine application and verified that I am speaking with the correct person using two identifiers.  Location: Patient: [place] at home Provider: remote office in [place]   I discussed the limitations of evaluation and management by telemedicine and the availability of in person appointments. The patient expressed understanding and agreed to proceed.  I discussed the assessment and treatment plan  with the patient. The patient was provided an opportunity to ask questions and all were answered. The patient agreed with the plan and demonstrated an understanding of the instructions.   The patient was advised to call back or seek an in-person evaluation if the symptoms worsen or if the condition fails to improve as anticipated.  I provided 15 minutes dedicated to the care of this patient via video on the date of this encounter to include chart review, face-to-face time with the patient, medication management/counseling, documentation, coordination of care with primary care provider.  Elsie Lincoln, MD 05/20/2023, 1:33 PM

## 2023-07-16 DIAGNOSIS — I10 Essential (primary) hypertension: Secondary | ICD-10-CM | POA: Diagnosis not present

## 2023-07-16 DIAGNOSIS — K5190 Ulcerative colitis, unspecified, without complications: Secondary | ICD-10-CM | POA: Diagnosis not present

## 2023-07-16 DIAGNOSIS — R52 Pain, unspecified: Secondary | ICD-10-CM | POA: Diagnosis not present

## 2023-07-16 DIAGNOSIS — F319 Bipolar disorder, unspecified: Secondary | ICD-10-CM | POA: Diagnosis not present

## 2023-07-16 DIAGNOSIS — M549 Dorsalgia, unspecified: Secondary | ICD-10-CM | POA: Diagnosis not present

## 2023-07-16 DIAGNOSIS — Z299 Encounter for prophylactic measures, unspecified: Secondary | ICD-10-CM | POA: Diagnosis not present

## 2023-07-16 DIAGNOSIS — G8929 Other chronic pain: Secondary | ICD-10-CM | POA: Diagnosis not present

## 2023-07-22 ENCOUNTER — Telehealth (INDEPENDENT_AMBULATORY_CARE_PROVIDER_SITE_OTHER): Payer: PPO | Admitting: Psychiatry

## 2023-07-22 ENCOUNTER — Encounter (HOSPITAL_COMMUNITY): Payer: Self-pay | Admitting: Psychiatry

## 2023-07-22 DIAGNOSIS — F3342 Major depressive disorder, recurrent, in full remission: Secondary | ICD-10-CM | POA: Diagnosis not present

## 2023-07-22 DIAGNOSIS — Z79899 Other long term (current) drug therapy: Secondary | ICD-10-CM | POA: Diagnosis not present

## 2023-07-22 DIAGNOSIS — F410 Panic disorder [episodic paroxysmal anxiety] without agoraphobia: Secondary | ICD-10-CM | POA: Diagnosis not present

## 2023-07-22 DIAGNOSIS — Z79891 Long term (current) use of opiate analgesic: Secondary | ICD-10-CM | POA: Diagnosis not present

## 2023-07-22 DIAGNOSIS — F411 Generalized anxiety disorder: Secondary | ICD-10-CM | POA: Diagnosis not present

## 2023-07-22 DIAGNOSIS — G894 Chronic pain syndrome: Secondary | ICD-10-CM | POA: Diagnosis not present

## 2023-07-22 DIAGNOSIS — F5104 Psychophysiologic insomnia: Secondary | ICD-10-CM | POA: Diagnosis not present

## 2023-07-22 MED ORDER — FLUOXETINE HCL 40 MG PO CAPS
40.0000 mg | ORAL_CAPSULE | Freq: Every day | ORAL | 2 refills | Status: DC
Start: 2023-07-22 — End: 2023-09-12

## 2023-07-22 MED ORDER — LAMOTRIGINE 150 MG PO TABS
150.0000 mg | ORAL_TABLET | Freq: Every evening | ORAL | 2 refills | Status: DC
Start: 2023-07-22 — End: 2023-09-12

## 2023-07-22 MED ORDER — ARIPIPRAZOLE 2 MG PO TABS
2.0000 mg | ORAL_TABLET | Freq: Every day | ORAL | 0 refills | Status: DC
Start: 2023-07-22 — End: 2023-07-22

## 2023-07-22 MED ORDER — ARIPIPRAZOLE 2 MG PO TABS
ORAL_TABLET | ORAL | 0 refills | Status: DC
Start: 2023-07-22 — End: 2023-09-12

## 2023-07-22 NOTE — Patient Instructions (Signed)
We decreased the Abilify to 2 mg once daily today. Take 1 tablet by mouth daily for 2 weeks.  Then cut in half once daily for 2 weeks then discontinue.  We otherwise kept the lamotrigine and fluoxetine the same.

## 2023-07-22 NOTE — Progress Notes (Signed)
BH MD Outpatient Progress Note  07/22/2023 1:07 PM MAXMILLIAN HOCHSTEIN  MRN:  [id number]  Assessment:  Stephen Porter presents for follow-up evaluation. Today, 07/22/23, patient reports stability of mood with decrease of Abilify to 2.5 mg daily and was amenable to further taper with plan to discontinue today.  He is still having a fair amount of increased appetite with weight gain which will hopefully improve with discontinuation.  Consider titration of Prozac but given that he is not having much in the way of anxiety or depression at this time did not have a clear target goal outside of increased appetite which should improve with discontinuation of Abilify at next visit to a certain degree.  Did not have a chance to follow up with PCP for sleep study referral so we will try to do that soon.  With planned discontinuation of Abilify will not look to repeat long-term antipsychotic monitoring. Not interested in psychotherapy at this time.  Follow-up in 2 months.   For safety, his acute risk factors for suicide are: Long-term opiate use, unemployed.  His chronic risk factors for suicide are: Long-term mental illness, chronic opiate use, chronic pain, history of substance use, history of heavier alcohol use.  His protective factors are: Beloved pets, supportive family and friends, actively seeking and engaging with mental health care, no suicidal ideation and visits today, no access to firearms.  While future events cannot be predicted he does not currently meet IVC criteria and can be continued as an outpatient.  Identifying Information: Stephen Porter is a 67 y.o. male with a history of generalized anxiety disorder with panic attacks, major depressive disorder in full remission, long-term current use of antipsychotic, psychophysiologic insomnia with snoring, chronic back pain on long-term opiate therapy, history of cocaine/stimulant/cannabis use disorder in sustained remission, history of long-term  benzodiazepine use requiring hospitalized detox who is an established patient with [facility]. Initial evaluation of medication management on 04/18/2023; please see that note for full case formulation.  Patient over the years under the various care of the different [facility] psychiatry providers.  He was trialed on a number of different benzodiazepines, SSRIs and SNRIs, mood stabilizing medications and was previously a patient for psychotherapy through the [facility] [facility].  Ultimately the regimen that he found the most benefit from was lamotrigine 150 mg daily, Abilify 5 mg daily, fluoxetine 40 mg daily.  It should be noted that for most of the SSRI and SNRI medications that were trials he noted a positive benefit from.  He also had to undergo medical detox from prescription benzodiazepines after requiring higher and higher doses of the medication.  Similar occurrence with prior prescription opiates after 4 back surgeries and 2 neck surgeries.  Patient established care with Dr. Adrian Blackwater on 04/18/2023.  Patient reported overall well controlled symptoms of depression and would consider to be admission at this point.  Main symptom remained anxiety which was generalized and had panic attacks.  As such she would likely benefit from titration of fluoxetine however with noting binge eating episodes and significant weight gain in the last year or so we will plan on decreasing Abilify first and consolidating lamotrigine dosing as outlined in plan below.     Plan:   # Generalized anxiety disorder with panic attacks Past medication trials:  Status of problem: Improving Interventions: -- Continue fluoxetine 40 mg daily    # Recurrent major depressive disorder in full remission Past medication trials:  Status of problem:  In remission Interventions: -- Decrease Abilify to 2mg  once daily for 2 weeks.  Then cut in half once daily for 2 weeks then discontinue. (d7/25/24,  d10/28/24, d11/11/24, dc11/25/24) with plan to discontinue by next appointment -- Continue lamotrigine dose 150 mg nightly    # Psychophysiologic insomnia with snoring Past medication trials:  Status of problem: Chronic and stable Interventions: -- Lamotrigine as above --Consider sleep study through PCP   # Chronic back pain on disability with long-term opiate therapy Past medication trials:  Status of problem: Chronic with mild exacerbation Interventions: -- Continue with pain management --Continue fentanyl patch for pain management   # Long-term current use of antipsychotic Past medication trials:  Status of problem: Chronic and stable Interventions: -- Coordinate with PCP for updated EKG, lipid panel, A1c.  Though may not be necessary with plan discontinuation by next appointment   # History of tobacco/cannabis/cocaine/speed/benzodiazepine dependence in sustained remission Past medication trials:  Status of problem: In remission Interventions: -- Continue to encourage abstinence  Patient was given contact information for behavioral health clinic and was instructed to call 911 for emergencies.   Subjective:  Chief Complaint:  Chief Complaint  Patient presents with   Anxiety   Follow-up    Interval History: Things have been good since last appointment. Can't tell much difference with the decrease of abilify. Enjoying going to grandchildren's ball games and physically still moving slow but feels alright. They were able to cut back on his fentanyl visits to every 2 months but still has chronic pain which limits his movement so keeps putting on weight. Still hopeful to lose weight with planned discontinuation of abilify. Sleeping ok aside from waking during the night to urinate. Still calm overall lately with no panic attacks.  Still need sleep study.   Visit Diagnosis:    ICD-10-CM   1. Generalized anxiety disorder with panic attacks  F41.1 FLUoxetine (PROZAC) 40 MG capsule    F41.0     2. Recurrent major depressive disorder, in full remission (HCC)  F33.42 lamoTRIgine (LAMICTAL) 150 MG tablet    FLUoxetine (PROZAC) 40 MG capsule    ARIPiprazole (ABILIFY) 2 MG tablet    DISCONTINUED: ARIPiprazole (ABILIFY) 2 MG tablet    3. Long term current use of antipsychotic medication  Z79.899     4. Chronic pain syndrome  G89.4     5. Chronic prescription opiate use  Z79.891     6. Psychophysiologic insomnia with snoring  F51.04 lamoTRIgine (LAMICTAL) 150 MG tablet       Past Psychiatric History:  Diagnoses: generalized anxiety disorder with panic attacks, major depressive disorder in full remission, long-term current use of antipsychotic, psychophysiologic insomnia with snoring, chronic back pain on long-term opiate therapy, history of cocaine/stimulant/cannabis use disorder in sustained remission, history of long-term benzodiazepine use requiring hospitalized detox Medication trials: fluoextine (effective), abilify (effective), lamotrigine (effective), lexapro (effective), cymbalta (effective), pristiq (doesn't remember), seroquel (doesn't remember), xanax (effective but escalating dose), librium, ativan, diazepam Previous psychiatrist/therapist: Dr. Tenny Craw Hospitalizations: prior to 2014 hospitalization for SI with plan Suicide attempts: none SIB: none Hx of violence towards others: none Current access to guns: none Hx of trauma/abuse: none Substance use: none currently. No alcohol currently. When younger drank beer and liquors at times. Has drunk to passing out when younger. No complicated withdrawal when cutting back. Used to use tobacco but quit in 2016. Previously marijuana (smoked), cocaine (snorted), speed (oral). Had to go to rehab to get off the oxycodone from his back surgeries  because he became dependent. Had to also have detox in hospital from prescription xanax.   Past Medical History:  Past Medical History:  Diagnosis Date   Bipolar disorder (HCC)     DDD (degenerative disc disease)    Depression    Hyperlipidemia    Insomnia    Jejunal ulcer    Leukocytosis 04/12/2016   Leukocytosis 04/12/2016   Psychoactive substance-induced organic mood disorder (HCC) 02/25/2012   Skin rash    Toxic encephalopathy 02/26/2021    Past Surgical History:  Procedure Laterality Date   BACK SURGERY     CERVICAL SPINE SURGERY     x3   CHOLECYSTECTOMY     COLONOSCOPY  2006   COLONOSCOPY N/A 06/30/2014   Procedure: COLONOSCOPY;  Surgeon: Malissa Hippo, MD;  [facility];  Service: Endoscopy;  Laterality: N/A;  830   COLONOSCOPY WITH PROPOFOL N/A 12/05/2022   Procedure: COLONOSCOPY WITH PROPOFOL;  Surgeon: Dolores Frame, MD;  Location: [facility];  Service: Gastroenterology;  Laterality: N/A;  915am, asa 1-2   LUMBAR SPINE SURGERY     POLYPECTOMY  12/05/2022   Procedure: POLYPECTOMY;  Surgeon: Marguerita Merles, Reuel Boom, MD;  Location: [facility];  Service: Gastroenterology;;   UPPER GASTROINTESTINAL ENDOSCOPY      Family Psychiatric History: as below  Family History:  Family History  Problem Relation Age of Onset   Hypertension Sister    Anxiety disorder Sister    Hypertension Brother    Hypertension Brother    Hypertension Brother    Dementia Mother    Paranoid behavior Mother    Dementia Father    Alcohol abuse Paternal Uncle    Drug abuse Paternal Uncle    ADD / ADHD Neg Hx    Depression Neg Hx    OCD Neg Hx    Schizophrenia Neg Hx    Seizures Neg Hx    Sexual abuse Neg Hx    Physical abuse Neg Hx    Colon cancer Neg Hx     Social History:  Academic/Vocational: on disability  Social History   Socioeconomic History   Marital status: Married    Spouse name: Not on file   Number of children: Not on file   Years of education: Not on file   Highest education level: Not on file  Occupational History   Occupation: Fish farm manager: UNEMPLOYED  Tobacco Use   Smoking status: Former     Current packs/day: 1.00    Average packs/day: 1 pack/day for 40.0 years (40.0 ttl pk-yrs)    Types: Cigarettes   Smokeless tobacco: Former   Tobacco comments:    07-05-2016 Dip, 01-22-2017 per pt he stopped Sep 25, 2016  Vaping Use   Vaping status: Never Used  Substance and Sexual Activity   Alcohol use: No    Comment: 07-05-2016 per pt no   Drug use: No    Comment: 07-05-2016 per pt no and he stopped Marijuana 1 yr ago   Sexual activity: Not on file  Other Topics Concern   Not on file  Social History Narrative   Married with 3 children and lives locally   No regular exercise   Social Determinants of Health   Financial Resource Strain: Not on file  Food Insecurity: Not on file  Transportation Needs: Not on file  Physical Activity: Not on file  Stress: Not on file  Social Connections: Not on file    Allergies:  Allergies  Allergen  Reactions   Elavil [Amitriptyline] Other (See Comments)    Made the patient "shaky"    Current Medications: Current Outpatient Medications  Medication Sig Dispense Refill   amLODipine (NORVASC) 5 MG tablet Take 5 mg by mouth at bedtime.      ARIPiprazole (ABILIFY) 2 MG tablet Take 1 tablet by mouth daily for 2 weeks.  Then cut in half once daily for 2 weeks then discontinue. 30 tablet 0   fentaNYL (DURAGESIC - DOSED MCG/HR) 50 MCG/HR Apply 50 mcg/hr topically every 3 (three) days.     FLUoxetine (PROZAC) 40 MG capsule Take 1 capsule (40 mg total) by mouth daily. 30 capsule 2   ibuprofen (ADVIL) 200 MG tablet Take 400-600 mg by mouth every 6 (six) hours as needed for mild pain or headache.     lamoTRIgine (LAMICTAL) 150 MG tablet Take 1 tablet (150 mg total) by mouth at bedtime. 30 tablet 2   rosuvastatin (CRESTOR) 5 MG tablet Take 5 mg by mouth daily.     No current facility-administered medications for this visit.    ROS: Review of Systems  Constitutional:  Positive for appetite change and unexpected weight change.  Gastrointestinal:   Positive for constipation. Negative for diarrhea, nausea and vomiting.  Endocrine: Positive for polyphagia.  Musculoskeletal:  Positive for arthralgias, back pain, gait problem and neck pain.  Psychiatric/Behavioral:  Negative for decreased concentration, dysphoric mood, hallucinations, sleep disturbance and suicidal ideas. The patient is not nervous/anxious.     Objective:  Psychiatric Specialty Exam: There were no vitals taken for this visit.There is no height or weight on file to calculate BMI.  General Appearance: Casual, Fairly Groomed, and wearing glasses with beard. Appears stated age  Eye Contact:  Good  Speech:  Clear and Coherent and Normal Rate  Volume:  Normal  Mood:   "Doing alright"  Affect:  Appropriate, Congruent, and euthymic  Thought Content: Logical and Hallucinations: None   Suicidal Thoughts:  No  Homicidal Thoughts:  No  Thought Process:  Coherent, Goal Directed, and Linear  Orientation:  Full (Time, Place, and Person)    Memory:  Grossly intact   Judgment:  Fair  Insight:  Fair  Concentration:  Concentration: Fair  Recall:  not formally assessed   Fund of Knowledge: Fair  Language: Fair  Psychomotor Activity:  Normal  Akathisia:  No  AIMS (if indicated): not done  Assets:  Communication Skills Desire for Improvement Financial Resources/Insurance Housing Intimacy Leisure Time Resilience Social Support Talents/Skills Transportation  ADL's:  Impaired  Cognition: WNL  Sleep:  Fair   PE: General: sits comfortably in view of camera; no acute distress  Pulm: no increased work of breathing on room air  MSK: all extremity movements appear intact  Neuro: no focal neurological deficits observed  Gait & Station: unable to assess by video    Metabolic Disorder Labs: Lab Results  Component Value Date   HGBA1C 5.5 02/26/2021   MPG 111 02/26/2021   MPG 108.28 12/10/2018   No results found for: "PROLACTIN" Lab Results  Component Value Date   CHOL  129 02/28/2021   TRIG 126 02/28/2021   HDL 38 (L) 02/28/2021   CHOLHDL 3.4 02/28/2021   VLDL 25 02/28/2021   LDLCALC 66 02/28/2021   LDLCALC 109 (H) 12/10/2018   Lab Results  Component Value Date   TSH 1.082 02/26/2021   TSH 0.959 02/23/2012    Therapeutic Level Labs: No results found for: "LITHIUM" No results found for: "VALPROATE" No results  found for: "CBMZ"  Screenings:  PHQ2-9    Flowsheet Row Office Visit from 04/18/2023 in [facility] [place] Video Visit from 01/20/2021 in [facility] at [facility] Total Score 0 2  PHQ-9 Total Score 5 8      Flowsheet Row Office Visit from 04/18/2023 in [facility] at [place] Admission (Discharged) from 12/05/2022 in [facility] ED from 03/26/2021 in [facility] Department at [facility]  C-SSRS RISK CATEGORY No Risk No Risk No Risk       Collaboration of Care: Collaboration of Care: Medication Management AEB as above and Primary Care Provider AEB as above  Patient/Guardian was advised Release of Information must be obtained prior to any record release in order to collaborate their care with an outside provider. Patient/Guardian was advised if they have not already done so to contact the registration department to sign all necessary forms in order for [place] to release information regarding their care.   Consent: Patient/Guardian gives verbal consent for treatment and assignment of benefits for services provided during this visit. Patient/Guardian expressed understanding and agreed to proceed.   Televisit via video: I connected with patient on 07/22/23 at  1:00 PM EDT by a video enabled telemedicine application and verified that I am speaking with the correct person using two identifiers.  Location: Patient: [place] at home Provider: remote office in [place]   I discussed the limitations of evaluation and management  by telemedicine and the availability of in person appointments. The patient expressed understanding and agreed to proceed.  I discussed the assessment and treatment plan with the patient. The patient was provided an opportunity to ask questions and all were answered. The patient agreed with the plan and demonstrated an understanding of the instructions.   The patient was advised to call back or seek an in-person evaluation if the symptoms worsen or if the condition fails to improve as anticipated.  I provided 15 minutes dedicated to the care of this patient via video on the date of this encounter to include chart review, face-to-face time with the patient, medication management/counseling, documentation, coordination of care with primary care provider.  Elsie Lincoln, MD 07/22/2023, 1:07 PM

## 2023-08-21 DIAGNOSIS — F319 Bipolar disorder, unspecified: Secondary | ICD-10-CM | POA: Diagnosis not present

## 2023-08-21 DIAGNOSIS — I10 Essential (primary) hypertension: Secondary | ICD-10-CM | POA: Diagnosis not present

## 2023-08-21 DIAGNOSIS — F1120 Opioid dependence, uncomplicated: Secondary | ICD-10-CM | POA: Diagnosis not present

## 2023-08-21 DIAGNOSIS — R52 Pain, unspecified: Secondary | ICD-10-CM | POA: Diagnosis not present

## 2023-08-21 DIAGNOSIS — Z299 Encounter for prophylactic measures, unspecified: Secondary | ICD-10-CM | POA: Diagnosis not present

## 2023-08-21 DIAGNOSIS — Z0000 Encounter for general adult medical examination without abnormal findings: Secondary | ICD-10-CM | POA: Diagnosis not present

## 2023-09-12 ENCOUNTER — Encounter (HOSPITAL_COMMUNITY): Payer: Self-pay | Admitting: Psychiatry

## 2023-09-12 ENCOUNTER — Telehealth (HOSPITAL_COMMUNITY): Payer: PPO | Admitting: Psychiatry

## 2023-09-12 DIAGNOSIS — F5104 Psychophysiologic insomnia: Secondary | ICD-10-CM

## 2023-09-12 DIAGNOSIS — F410 Panic disorder [episodic paroxysmal anxiety] without agoraphobia: Secondary | ICD-10-CM

## 2023-09-12 DIAGNOSIS — F411 Generalized anxiety disorder: Secondary | ICD-10-CM

## 2023-09-12 DIAGNOSIS — F3342 Major depressive disorder, recurrent, in full remission: Secondary | ICD-10-CM | POA: Diagnosis not present

## 2023-09-12 DIAGNOSIS — G894 Chronic pain syndrome: Secondary | ICD-10-CM

## 2023-09-12 MED ORDER — LAMOTRIGINE 150 MG PO TABS
150.0000 mg | ORAL_TABLET | Freq: Every evening | ORAL | 0 refills | Status: AC
Start: 2023-09-12 — End: ?

## 2023-09-12 MED ORDER — FLUOXETINE HCL 40 MG PO CAPS
40.0000 mg | ORAL_CAPSULE | Freq: Every day | ORAL | 0 refills | Status: AC
Start: 2023-09-12 — End: ?

## 2023-09-12 NOTE — Progress Notes (Signed)
BH MD Outpatient Progress Note  09/12/2023 2:56 PM Stephen Porter  MRN:  [id number]  Assessment:  Stephen Porter presents for follow-up evaluation. Today, 09/12/23, patient reports stability of mood with decrease of Abilify to 1mg  daily and was amenable to discontinue once he finishes the current amount of pills.  He is still having a fair amount of increased appetite with weight gain which will hopefully improve with discontinuation.  Consider titration of Prozac but given that he is not having much in the way of anxiety or depression at this time did not have a clear target goal outside of increased appetite which should improve with discontinuation of Abilify at next visit to a certain degree.  Did not have a chance to follow up with PCP for sleep study referral so we will try to do that soon.  With planned discontinuation of Abilify will not look to repeat long-term antipsychotic monitoring. Not interested in psychotherapy at this time.  Follow-up in 3 months.   For safety, his acute risk factors for suicide are: Long-term opiate use, unemployed.  His chronic risk factors for suicide are: Long-term mental illness, chronic opiate use, chronic pain, history of substance use, history of heavier alcohol use.  His protective factors are: Beloved pets, supportive family and friends, actively seeking and engaging with mental health care, no suicidal ideation and visits today, no access to firearms.  While future events cannot be predicted he does not currently meet IVC criteria and can be continued as an outpatient.  Identifying Information: Stephen Porter is a 67 y.o. male with a history of generalized anxiety disorder with panic attacks, major depressive disorder in full remission, long-term current use of antipsychotic, psychophysiologic insomnia with snoring, chronic back pain on long-term opiate therapy, history of cocaine/stimulant/cannabis use disorder in sustained remission, history of long-term  benzodiazepine use requiring hospitalized detox who is an established patient with [facility]. Initial evaluation of medication management on 04/18/2023; please see that note for full case formulation.  Patient over the years under the various care of the different [place] psychiatry providers.  He was trialed on a number of different benzodiazepines, SSRIs and SNRIs, mood stabilizing medications and was previously a patient for psychotherapy through the [place] [facility].  Ultimately the regimen that he found the most benefit from was lamotrigine 150 mg daily, Abilify 5 mg daily, fluoxetine 40 mg daily.  It should be noted that for most of the SSRI and SNRI medications that were trials he noted a positive benefit from.  He also had to undergo medical detox from prescription benzodiazepines after requiring higher and higher doses of the medication.  Similar occurrence with prior prescription opiates after 4 back surgeries and 2 neck surgeries.  Patient established care with Dr. Adrian Blackwater on 04/18/2023.  Patient reported overall well controlled symptoms of depression and would consider to be admission at this point.  Main symptom remained anxiety which was generalized and had panic attacks.  As such she would likely benefit from titration of fluoxetine however with noting binge eating episodes and significant weight gain in the last year or so we will plan on decreasing Abilify first and consolidating lamotrigine dosing as outlined in plan below.     Plan:   # Generalized anxiety disorder with panic attacks Past medication trials:  Status of problem: Improving Interventions: -- Continue fluoxetine 40 mg daily    # Recurrent major depressive disorder in full remission Past medication trials:  Status of  problem: In remission Interventions: -- Plan to discontinue Abilify to 1mg  once daily once he runs out.  (d7/25/24, d10/28/24, d11/11/24, dc12/22/24)  -- Continue  lamotrigine dose 150 mg nightly    # Psychophysiologic insomnia with snoring Past medication trials:  Status of problem: Chronic and stable Interventions: -- Lamotrigine as above --Consider sleep study through PCP   # Chronic back pain on disability with long-term opiate therapy Past medication trials:  Status of problem: Chronic with mild exacerbation Interventions: -- Continue with pain management --Continue fentanyl patch for pain management   # Long-term current use of antipsychotic Past medication trials:  Status of problem: Chronic and stable Interventions: -- Will not plan on getting labs with planned discontinuation   # History of tobacco/cannabis/cocaine/speed/benzodiazepine dependence in sustained remission Past medication trials:  Status of problem: In remission Interventions: -- Continue to encourage abstinence  Patient was given contact information for behavioral health clinic and was instructed to call 911 for emergencies.   Subjective:  Chief Complaint:  Chief Complaint  Patient presents with   Anxiety   Follow-up   Insomnia    Interval History: Things have been pretty good since last appointment outside of a fever and chills yesterday but went away. Has a couple more of the abilify 1mg  dose and things still going fine. Pain is still constant however with some days being worse than others, no other changes to his fentanyl in the meantime. Still enjoying going to grandchildren's ball games and physically still moving slow but feels alright. Still trying to avoid ending up at a pain clinic. Still hopeful to lose weight with planned discontinuation of abilify. Sleeping ok aside from waking during the night to urinate. Still calm overall lately with no panic attacks.    Visit Diagnosis:    ICD-10-CM   1. Chronic pain syndrome  G89.4     2. Recurrent major depressive disorder, in full remission (HCC)  F33.42 lamoTRIgine (LAMICTAL) 150 MG tablet    FLUoxetine  (PROZAC) 40 MG capsule    3. Psychophysiologic insomnia with snoring  F51.04 lamoTRIgine (LAMICTAL) 150 MG tablet    4. Generalized anxiety disorder with panic attacks  F41.1 FLUoxetine (PROZAC) 40 MG capsule   F41.0         Past Psychiatric History:  Diagnoses: generalized anxiety disorder with panic attacks, major depressive disorder in full remission, long-term current use of antipsychotic, psychophysiologic insomnia with snoring, chronic back pain on long-term opiate therapy, history of cocaine/stimulant/cannabis use disorder in sustained remission, history of long-term benzodiazepine use requiring hospitalized detox Medication trials: fluoextine (effective), abilify (effective), lamotrigine (effective), lexapro (effective), cymbalta (effective), pristiq (doesn't remember), seroquel (doesn't remember), xanax (effective but escalating dose), librium, ativan, diazepam Previous psychiatrist/therapist: Dr. Tenny Craw Hospitalizations: prior to 2014 hospitalization for SI with plan Suicide attempts: none SIB: none Hx of violence towards others: none Current access to guns: none Hx of trauma/abuse: none Substance use: none currently. No alcohol currently. When younger drank beer and liquors at times. Has drunk to passing out when younger. No complicated withdrawal when cutting back. Used to use tobacco but quit in 2016. Previously marijuana (smoked), cocaine (snorted), speed (oral). Had to go to rehab to get off the oxycodone from his back surgeries because he became dependent. Had to also have detox in hospital from prescription xanax.   Past Medical History:  Past Medical History:  Diagnosis Date   Bipolar disorder (HCC)    DDD (degenerative disc disease)    Depression    Hyperlipidemia  Insomnia    Jejunal ulcer    Leukocytosis 04/12/2016   Leukocytosis 04/12/2016   Long term current use of antipsychotic medication 04/18/2023   Psychoactive substance-induced organic mood disorder  (HCC) 02/25/2012   Skin rash    Toxic encephalopathy 02/26/2021    Past Surgical History:  Procedure Laterality Date   BACK SURGERY     CERVICAL SPINE SURGERY     x3   CHOLECYSTECTOMY     COLONOSCOPY  2006   COLONOSCOPY N/A 06/30/2014   Procedure: COLONOSCOPY;  Surgeon: Malissa Hippo, MD;  [facility];  Service: Endoscopy;  Laterality: N/A;  830   COLONOSCOPY WITH PROPOFOL N/A 12/05/2022   Procedure: COLONOSCOPY WITH PROPOFOL;  Surgeon: Dolores Frame, MD;  Location: [facility];  Service: Gastroenterology;  Laterality: N/A;  915am, asa 1-2   LUMBAR SPINE SURGERY     POLYPECTOMY  12/05/2022   Procedure: POLYPECTOMY;  Surgeon: Marguerita Merles, Reuel Boom, MD;  Location: [facility];  Service: Gastroenterology;;   UPPER GASTROINTESTINAL ENDOSCOPY      Family Psychiatric History: as below  Family History:  Family History  Problem Relation Age of Onset   Hypertension Sister    Anxiety disorder Sister    Hypertension Brother    Hypertension Brother    Hypertension Brother    Dementia Mother    Paranoid behavior Mother    Dementia Father    Alcohol abuse Paternal Uncle    Drug abuse Paternal Uncle    ADD / ADHD Neg Hx    Depression Neg Hx    OCD Neg Hx    Schizophrenia Neg Hx    Seizures Neg Hx    Sexual abuse Neg Hx    Physical abuse Neg Hx    Colon cancer Neg Hx     Social History:  Academic/Vocational: on disability  Social History   Socioeconomic History   Marital status: Married    Spouse name: Not on file   Number of children: Not on file   Years of education: Not on file   Highest education level: Not on file  Occupational History   Occupation: Fish farm manager: UNEMPLOYED  Tobacco Use   Smoking status: Former    Current packs/day: 1.00    Average packs/day: 1 pack/day for 40.0 years (40.0 ttl pk-yrs)    Types: Cigarettes   Smokeless tobacco: Former   Tobacco comments:    07-05-2016 Dip, 01-22-2017 per pt he stopped Sep 25, 2016  Vaping Use   Vaping status: Never Used  Substance and Sexual Activity   Alcohol use: No    Comment: 07-05-2016 per pt no   Drug use: No    Comment: 07-05-2016 per pt no and he stopped Marijuana 1 yr ago   Sexual activity: Not on file  Other Topics Concern   Not on file  Social History Narrative   Married with 3 children and lives locally   No regular exercise   Social Drivers of Corporate investment banker Strain: Not on file  Food Insecurity: Not on file  Transportation Needs: Not on file  Physical Activity: Not on file  Stress: Not on file  Social Connections: Not on file    Allergies:  Allergies  Allergen Reactions   Elavil [Amitriptyline] Other (See Comments)    Made the patient "shaky"    Current Medications: Current Outpatient Medications  Medication Sig Dispense Refill   amLODipine (NORVASC) 5 MG tablet Take 5 mg by  mouth at bedtime.      fentaNYL (DURAGESIC - DOSED MCG/HR) 50 MCG/HR Apply 50 mcg/hr topically every 3 (three) days.     FLUoxetine (PROZAC) 40 MG capsule Take 1 capsule (40 mg total) by mouth daily. 90 capsule 0   ibuprofen (ADVIL) 200 MG tablet Take 400-600 mg by mouth every 6 (six) hours as needed for mild pain or headache.     lamoTRIgine (LAMICTAL) 150 MG tablet Take 1 tablet (150 mg total) by mouth at bedtime. 90 tablet 0   rosuvastatin (CRESTOR) 5 MG tablet Take 5 mg by mouth daily.     No current facility-administered medications for this visit.    ROS: Review of Systems  Constitutional:  Positive for appetite change and unexpected weight change.  Gastrointestinal:  Positive for constipation. Negative for diarrhea, nausea and vomiting.  Endocrine: Positive for polyphagia.  Musculoskeletal:  Positive for arthralgias, back pain, gait problem and neck pain.  Psychiatric/Behavioral:  Negative for decreased concentration, dysphoric mood, hallucinations, sleep disturbance and suicidal ideas. The patient is not nervous/anxious.      Objective:  Psychiatric Specialty Exam: There were no vitals taken for this visit.There is no height or weight on file to calculate BMI.  General Appearance: Casual, Fairly Groomed, and wearing glasses with beard. Appears stated age  Eye Contact:  Good  Speech:  Clear and Coherent and Normal Rate  Volume:  Normal  Mood:   "Pretty good I cannot tell a difference with decreasing the Abilify"  Affect:  Appropriate, Congruent, and euthymic  Thought Content: Logical and Hallucinations: None   Suicidal Thoughts:  No  Homicidal Thoughts:  No  Thought Process:  Coherent, Goal Directed, and Linear  Orientation:  Full (Time, Place, and Person)    Memory:  Grossly intact   Judgment:  Fair  Insight:  Fair  Concentration:  Concentration: Fair  Recall:  not formally assessed   Fund of Knowledge: Fair  Language: Fair  Psychomotor Activity:  Normal  Akathisia:  No  AIMS (if indicated): not done  Assets:  Communication Skills Desire for Improvement Financial Resources/Insurance Housing Intimacy Leisure Time Resilience Social Support Talents/Skills Transportation  ADL's:  Impaired  Cognition: WNL  Sleep:  Fair   PE: General: sits comfortably in view of camera; no acute distress  Pulm: no increased work of breathing on room air  MSK: all extremity movements appear intact  Neuro: no focal neurological deficits observed  Gait & Station: unable to assess by video    Metabolic Disorder Labs: Lab Results  Component Value Date   HGBA1C 5.5 02/26/2021   MPG 111 02/26/2021   MPG 108.28 12/10/2018   No results found for: "PROLACTIN" Lab Results  Component Value Date   CHOL 129 02/28/2021   TRIG 126 02/28/2021   HDL 38 (L) 02/28/2021   CHOLHDL 3.4 02/28/2021   VLDL 25 02/28/2021   LDLCALC 66 02/28/2021   LDLCALC 109 (H) 12/10/2018   Lab Results  Component Value Date   TSH 1.082 02/26/2021   TSH 0.959 02/23/2012    Therapeutic Level Labs: No results found for:  "LITHIUM" No results found for: "VALPROATE" No results found for: "CBMZ"  Screenings:  PHQ2-9    Flowsheet Row Office Visit from 04/18/2023 in [facility] [place] Video Visit from 01/20/2021 in [facility] at [facility] Total Score 0 2  PHQ-9 Total Score 5 8      Flowsheet Row Office Visit from 04/18/2023 in [facility] at [facility] Admission (Discharged) from 12/05/2022 in [place] [facility] ED from 03/26/2021 in [facility] Emergency Department at [facility]  C-SSRS RISK CATEGORY No Risk No Risk No Risk       Collaboration of Care: Collaboration of Care: Medication Management AEB as above and Primary Care Provider AEB as above  Patient/Guardian was advised Release of Information must be obtained prior to any record release in order to collaborate their care with an outside provider. Patient/Guardian was advised if they have not already done so to contact the registration department to sign all necessary forms in order for [place] to release information regarding their care.   Consent: Patient/Guardian gives verbal consent for treatment and assignment of benefits for services provided during this visit. Patient/Guardian expressed understanding and agreed to proceed.   Televisit via video: I connected with patient on 09/12/23 at  2:30 PM EST by a video enabled telemedicine application and verified that I am speaking with the correct person using two identifiers.  Location: Patient: [place] at home Provider: remote office in [place]   I discussed the limitations of evaluation and management by telemedicine and the availability of in person appointments. The patient expressed understanding and agreed to proceed.  I discussed the assessment and treatment plan with the patient. The patient was provided an opportunity to ask questions and all were answered. The patient agreed with the plan and  demonstrated an understanding of the instructions.   The patient was advised to call back or seek an in-person evaluation if the symptoms worsen or if the condition fails to improve as anticipated.  I provided 20 minutes dedicated to the care of this patient via video on the date of this encounter to include chart review, face-to-face time with the patient, medication management/counseling, documentation, coordination of care with primary care provider.  Elsie Lincoln, MD 09/12/2023, 2:56 PM

## 2023-09-12 NOTE — Patient Instructions (Addendum)
We will plan on discontinuing abilify when you are out of current amount.  We otherwise kept your Lamictal and fluoxetine the same.  You may still want to consider a sleep study for that snoring.

## 2023-10-21 DIAGNOSIS — Z299 Encounter for prophylactic measures, unspecified: Secondary | ICD-10-CM | POA: Diagnosis not present

## 2023-10-21 DIAGNOSIS — F319 Bipolar disorder, unspecified: Secondary | ICD-10-CM | POA: Diagnosis not present

## 2023-10-21 DIAGNOSIS — G8929 Other chronic pain: Secondary | ICD-10-CM | POA: Diagnosis not present

## 2023-10-21 DIAGNOSIS — M549 Dorsalgia, unspecified: Secondary | ICD-10-CM | POA: Diagnosis not present

## 2023-10-21 DIAGNOSIS — K5190 Ulcerative colitis, unspecified, without complications: Secondary | ICD-10-CM | POA: Diagnosis not present

## 2023-10-21 DIAGNOSIS — I10 Essential (primary) hypertension: Secondary | ICD-10-CM | POA: Diagnosis not present

## 2023-10-21 DIAGNOSIS — R52 Pain, unspecified: Secondary | ICD-10-CM | POA: Diagnosis not present

## 2023-10-31 DIAGNOSIS — J019 Acute sinusitis, unspecified: Secondary | ICD-10-CM | POA: Diagnosis not present

## 2023-10-31 DIAGNOSIS — Z299 Encounter for prophylactic measures, unspecified: Secondary | ICD-10-CM | POA: Diagnosis not present

## 2023-10-31 DIAGNOSIS — I10 Essential (primary) hypertension: Secondary | ICD-10-CM | POA: Diagnosis not present

## 2023-10-31 DIAGNOSIS — R52 Pain, unspecified: Secondary | ICD-10-CM | POA: Diagnosis not present

## 2023-10-31 DIAGNOSIS — F319 Bipolar disorder, unspecified: Secondary | ICD-10-CM | POA: Diagnosis not present

## 2023-10-31 DIAGNOSIS — R6889 Other general symptoms and signs: Secondary | ICD-10-CM | POA: Diagnosis not present

## 2023-11-04 ENCOUNTER — Ambulatory Visit (INDEPENDENT_AMBULATORY_CARE_PROVIDER_SITE_OTHER): Payer: PPO | Admitting: Gastroenterology

## 2023-11-07 ENCOUNTER — Ambulatory Visit
Admission: RE | Admit: 2023-11-07 | Discharge: 2023-11-07 | Disposition: A | Discharge status: Home / Self Care | Payer: PPO | Source: Ambulatory Visit | Attending: Family Medicine | Admitting: Family Medicine

## 2023-11-07 VITALS — BP 146/80 | HR 77 | Temp 98.1°F | Resp 26

## 2023-11-07 DIAGNOSIS — J22 Unspecified acute lower respiratory infection: Secondary | ICD-10-CM | POA: Diagnosis not present

## 2023-11-07 DIAGNOSIS — R062 Wheezing: Secondary | ICD-10-CM | POA: Diagnosis not present

## 2023-11-07 DIAGNOSIS — R0609 Other forms of dyspnea: Secondary | ICD-10-CM

## 2023-11-07 DIAGNOSIS — R509 Fever, unspecified: Secondary | ICD-10-CM | POA: Diagnosis not present

## 2023-11-07 MED ORDER — PROMETHAZINE-DM 6.25-15 MG/5ML PO SYRP: 5.0000 mL | ORAL_SOLUTION | Freq: Four times a day (QID) | ORAL | 0 refills | Status: AC | PRN

## 2023-11-07 MED ORDER — DOXYCYCLINE HYCLATE 100 MG PO CAPS: 100.0000 mg | ORAL_CAPSULE | Freq: Two times a day (BID) | ORAL | 0 refills | Status: AC

## 2023-11-07 MED ORDER — DEXAMETHASONE SODIUM PHOSPHATE 10 MG/ML IJ SOLN
10.0000 mg | Freq: Once | INTRAMUSCULAR | Status: AC
  Administered 2023-11-07: 10 mg via INTRAMUSCULAR

## 2023-11-07 MED ORDER — DOXYCYCLINE HYCLATE 100 MG PO TABS
100.0000 mg | ORAL_TABLET | Freq: Once | ORAL | Status: AC
  Administered 2023-11-07: 100 mg via ORAL

## 2023-11-07 NOTE — ED Triage Notes (Signed)
Pt report to [facility] with SOB, fever, and cough x 4 days - 1 week.      Pcp gave amoxicillin. Covid and flu test neg at pcp office.   Pt is SOB with exertion

## 2023-11-07 NOTE — ED Provider Notes (Signed)
[facility]    CSN: [id number] Arrival date & time: 11/07/23  1835      History   Chief Complaint Chief Complaint  Patient presents with   Shortness of Breath    HPI Stephen Porter is a 68 y.o. male.   Patient presenting today with about 2-week history of fevers, progressively worsening productive cough and now 4 days of dyspnea on exertion, lower oxygen saturations which she states home readings have been as low as 86% and as high as 96% in the past few days.  Primary care prescribed him amoxicillin 1 week ago which he states has not been helping at all and his symptoms continue to worsen.  Has been taking Mucinex here and there over-the-counter additionally.  No known history of chronic pulmonary disease.  States he is still having fevers.    Past Medical History:  Diagnosis Date   Bipolar disorder (HCC)    DDD (degenerative disc disease)    Depression    Hyperlipidemia    Insomnia    Jejunal ulcer    Leukocytosis 04/12/2016   Leukocytosis 04/12/2016   Long term current use of antipsychotic medication 04/18/2023   Psychoactive substance-induced organic mood disorder (HCC) 02/25/2012   Skin rash    Toxic encephalopathy 02/26/2021    Patient Active Problem List   Diagnosis Date Noted   History of colonic polyps 10/29/2022   Chronic prescription opiate use 10/29/2022   Constipation 10/29/2022   Cervical myelopathy (HCC) 06/19/2022   TIA (transient ischemic attack)    Chronic pain syndrome    Dyslipidemia    Weakness of left lower extremity 12/09/2018   Psychophysiologic insomnia with snoring 09/03/2012   Other depression due to general medical condition 02/24/2012   HYPERLIPIDEMIA 07/05/2010   Generalized anxiety disorder with panic attacks 02/08/2010   History of tobacco use disorder in sustained remission 02/08/2010   DEGENERATIVE DISC DISEASE 02/08/2010   History of inflammatory bowel disease 02/08/2010   Major depression in full remission (HCC)  02/08/2010    Past Surgical History:  Procedure Laterality Date   BACK SURGERY     CERVICAL SPINE SURGERY     x3   CHOLECYSTECTOMY     COLONOSCOPY  2006   COLONOSCOPY N/A 06/30/2014   Procedure: COLONOSCOPY;  Surgeon: Malissa Hippo, MD;  Location: [facility];  Service: Endoscopy;  Laterality: N/A;  830   COLONOSCOPY WITH PROPOFOL N/A 12/05/2022   Procedure: COLONOSCOPY WITH PROPOFOL;  Surgeon: Dolores Frame, MD;  Location: [facility];  Service: Gastroenterology;  Laterality: N/A;  915am, asa 1-2   LUMBAR SPINE SURGERY     POLYPECTOMY  12/05/2022   Procedure: POLYPECTOMY;  Surgeon: Dolores Frame, MD;  Location: [facility];  Service: Gastroenterology;;   UPPER GASTROINTESTINAL ENDOSCOPY         Home Medications    Prior to Admission medications   Medication Sig Start Date End Date Taking? Authorizing Provider  doxycycline (VIBRAMYCIN) 100 MG capsule Take 1 capsule (100 mg total) by mouth 2 (two) times daily. 11/07/23  Yes Particia Nearing, PA-C  promethazine-dextromethorphan (PROMETHAZINE-DM) 6.25-15 MG/5ML syrup Take 5 mLs by mouth 4 (four) times daily as needed. 11/07/23  Yes Particia Nearing, PA-C  amLODipine (NORVASC) 5 MG tablet Take 5 mg by mouth at bedtime.     [provider]  fentaNYL (DURAGESIC - DOSED MCG/HR) 50 MCG/HR Apply 50 mcg/hr topically every 3 (three) days. 08/03/12   [provider]  FLUoxetine (  PROZAC) 40 MG capsule Take 1 capsule (40 mg total) by mouth daily. 09/12/23   Elsie Lincoln, MD  ibuprofen (ADVIL) 200 MG tablet Take 400-600 mg by mouth every 6 (six) hours as needed for mild pain or headache.    [provider]  lamoTRIgine (LAMICTAL) 150 MG tablet Take 1 tablet (150 mg total) by mouth at bedtime. 09/12/23   Elsie Lincoln, MD  rosuvastatin (CRESTOR) 5 MG tablet Take 5 mg by mouth daily. 05/16/23   [provider]    Family History Family History  Problem Relation  Age of Onset   Hypertension Sister    Anxiety disorder Sister    Hypertension Brother    Hypertension Brother    Hypertension Brother    Dementia Mother    Paranoid behavior Mother    Dementia Father    Alcohol abuse Paternal Uncle    Drug abuse Paternal Uncle    ADD / ADHD Neg Hx    Depression Neg Hx    OCD Neg Hx    Schizophrenia Neg Hx    Seizures Neg Hx    Sexual abuse Neg Hx    Physical abuse Neg Hx    Colon cancer Neg Hx     Social History Social History   Tobacco Use   Smoking status: Former    Current packs/day: 1.00    Average packs/day: 1 pack/day for 40.0 years (40.0 ttl pk-yrs)    Types: Cigarettes   Smokeless tobacco: Former   Tobacco comments:    07-05-2016 Dip, 01-22-2017 per pt he stopped Sep 25, 2016  Vaping Use   Vaping status: Never Used  Substance Use Topics   Alcohol use: No    Comment: 07-05-2016 per pt no   Drug use: No    Comment: 07-05-2016 per pt no and he stopped Marijuana 1 yr ago     Allergies   Elavil [amitriptyline]   Review of Systems Review of Systems Per HPI  Physical Exam Triage Vital Signs ED Triage Vitals  Encounter Vitals Group     BP 11/07/23 1840 (!) 146/80     Systolic BP Percentile --      Diastolic BP Percentile --      Pulse Rate 11/07/23 1840 77     Resp 11/07/23 1840 (!) 26     Temp 11/07/23 1840 98.1 F (36.7 C)     Temp Source 11/07/23 1840 Oral     SpO2 11/07/23 1840 91 %     Weight --      Height --      Head Circumference --      Peak Flow --      Pain Score 11/07/23 1844 5     Pain Loc --      Pain Education --      Exclude from Growth Chart --    No data found.  Updated Vital Signs BP (!) 146/80 (BP Location: Right Arm)   Pulse 77   Temp 98.1 F (36.7 C) (Oral)   Resp (!) 26   SpO2 91%   Visual Acuity Right Eye Distance:   Left Eye Distance:   Bilateral Distance:    Right Eye Near:   Left Eye Near:    Bilateral Near:     Physical Exam Vitals and nursing note reviewed.   Constitutional:      Appearance: He is well-developed.  HENT:     Head: Atraumatic.     Right Ear: External ear normal.  Left Ear: External ear normal.     Nose: Congestion present.     Mouth/Throat:     Pharynx: No oropharyngeal exudate.  Eyes:     Conjunctiva/sclera: Conjunctivae normal.     Pupils: Pupils are equal, round, and reactive to light.  Cardiovascular:     Rate and Rhythm: Normal rate and regular rhythm.  Pulmonary:     Effort: Pulmonary effort is normal. No respiratory distress.     Breath sounds: Wheezing and rales present.     Comments: Breathing comfortably on room air, speaking in full sentences Musculoskeletal:        General: Normal range of motion.     Cervical back: Normal range of motion and neck supple.  Lymphadenopathy:     Cervical: No cervical adenopathy.  Skin:    General: Skin is warm and dry.  Neurological:     Mental Status: He is alert and oriented to person, place, and time.  Psychiatric:        Behavior: Behavior normal.      UC Treatments / Results  Labs (all labs ordered are listed, but only abnormal results are displayed) Labs Reviewed - No data to display  EKG   Radiology No results found.  Procedures Procedures (including critical care time)  Medications Ordered in UC Medications  dexamethasone (DECADRON) injection 10 mg (10 mg Intramuscular Given 11/07/23 1919)  doxycycline (VIBRA-TABS) tablet 100 mg (100 mg Oral Given 11/07/23 1919)    Initial Impression / Assessment and Plan / UC Course  I have reviewed the triage vital signs and the nursing notes.  Pertinent labs & imaging results that were available during my care of the patient were reviewed by me and considered in my medical decision making (see chart for details).     Given duration worsening course do suspect lower respiratory infection.  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  IM Decadron for wheezing, chest tightness.  Continue fever reducers, Mucinex, Phenergan DM, supportive over-the-counter medications and home care.  Strict return precautions reviewed.  Final Clinical Impressions(s) / UC Diagnoses   Final diagnoses:  Lower respiratory infection  Wheezing  Fever, unspecified  DOE (dyspnea on exertion)     Discharge Instructions      We have given you a dose of the antibiotic and a steroid shot today.  I have prescribed a full course of the antibiotic and a cough syrup additionally and you may take Mucinex, Coricidin HBP, Flonase, Tylenol.  Follow-up for worsening symptoms.    ED Prescriptions     Medication Sig Dispense Auth. Provider   doxycycline (VIBRAMYCIN) 100 MG capsule Take 1 capsule (100 mg total) by mouth 2 (two) times daily. 14 capsule Particia Nearing, [place]   promethazine-dextromethorphan (PROMETHAZINE-DM) 6.25-15 MG/5ML syrup Take 5 mLs by mouth 4 (four) times daily as needed. 100 mL Particia Nearing, [place]      PDMP not reviewed this encounter.   Particia Nearing, [place] 11/07/23 1929

## 2023-11-07 NOTE — Discharge Instructions (Signed)
We have given you a dose of the antibiotic and a steroid shot today.  I have prescribed a full course of the antibiotic and a cough syrup additionally and you may take Mucinex, Coricidin HBP, Flonase, Tylenol.  Follow-up for worsening symptoms.

## 2023-12-12 ENCOUNTER — Telehealth (HOSPITAL_COMMUNITY): Payer: PPO | Admitting: Psychiatry

## 2023-12-13 ENCOUNTER — Telehealth (HOSPITAL_COMMUNITY): Payer: PPO | Admitting: Psychiatry

## 2023-12-19 DIAGNOSIS — R52 Pain, unspecified: Secondary | ICD-10-CM | POA: Diagnosis not present

## 2023-12-19 DIAGNOSIS — Z0000 Encounter for general adult medical examination without abnormal findings: Secondary | ICD-10-CM | POA: Diagnosis not present

## 2023-12-19 DIAGNOSIS — Z299 Encounter for prophylactic measures, unspecified: Secondary | ICD-10-CM | POA: Diagnosis not present

## 2023-12-19 DIAGNOSIS — Z1339 Encounter for screening examination for other mental health and behavioral disorders: Secondary | ICD-10-CM | POA: Diagnosis not present

## 2023-12-19 DIAGNOSIS — Z1331 Encounter for screening for depression: Secondary | ICD-10-CM | POA: Diagnosis not present

## 2023-12-19 DIAGNOSIS — I700 Atherosclerosis of aorta: Secondary | ICD-10-CM | POA: Diagnosis not present

## 2023-12-19 DIAGNOSIS — I10 Essential (primary) hypertension: Secondary | ICD-10-CM | POA: Diagnosis not present

## 2023-12-19 DIAGNOSIS — Z7189 Other specified counseling: Secondary | ICD-10-CM | POA: Diagnosis not present

## 2023-12-19 DIAGNOSIS — F319 Bipolar disorder, unspecified: Secondary | ICD-10-CM | POA: Diagnosis not present

## 2024-02-18 DIAGNOSIS — G959 Disease of spinal cord, unspecified: Secondary | ICD-10-CM | POA: Diagnosis not present

## 2024-02-18 DIAGNOSIS — I10 Essential (primary) hypertension: Secondary | ICD-10-CM | POA: Diagnosis not present

## 2024-02-18 DIAGNOSIS — Z299 Encounter for prophylactic measures, unspecified: Secondary | ICD-10-CM | POA: Diagnosis not present

## 2024-02-18 DIAGNOSIS — G8929 Other chronic pain: Secondary | ICD-10-CM | POA: Diagnosis not present

## 2024-04-16 DIAGNOSIS — Z299 Encounter for prophylactic measures, unspecified: Secondary | ICD-10-CM | POA: Diagnosis not present

## 2024-04-16 DIAGNOSIS — I10 Essential (primary) hypertension: Secondary | ICD-10-CM | POA: Diagnosis not present

## 2024-04-16 DIAGNOSIS — E7800 Pure hypercholesterolemia, unspecified: Secondary | ICD-10-CM | POA: Diagnosis not present

## 2024-04-16 DIAGNOSIS — R52 Pain, unspecified: Secondary | ICD-10-CM | POA: Diagnosis not present

## 2024-04-16 DIAGNOSIS — R5383 Other fatigue: Secondary | ICD-10-CM | POA: Diagnosis not present

## 2024-04-16 DIAGNOSIS — Z0000 Encounter for general adult medical examination without abnormal findings: Secondary | ICD-10-CM | POA: Diagnosis not present

## 2024-04-16 DIAGNOSIS — G8929 Other chronic pain: Secondary | ICD-10-CM | POA: Diagnosis not present

## 2024-04-24 DIAGNOSIS — R5383 Other fatigue: Secondary | ICD-10-CM | POA: Diagnosis not present

## 2024-04-24 DIAGNOSIS — E7800 Pure hypercholesterolemia, unspecified: Secondary | ICD-10-CM | POA: Diagnosis not present

## 2024-04-24 DIAGNOSIS — Z125 Encounter for screening for malignant neoplasm of prostate: Secondary | ICD-10-CM | POA: Diagnosis not present

## 2024-04-24 DIAGNOSIS — Z79899 Other long term (current) drug therapy: Secondary | ICD-10-CM | POA: Diagnosis not present

## 2024-06-17 DIAGNOSIS — Z23 Encounter for immunization: Secondary | ICD-10-CM | POA: Diagnosis not present

## 2024-06-17 DIAGNOSIS — M549 Dorsalgia, unspecified: Secondary | ICD-10-CM | POA: Diagnosis not present

## 2024-06-17 DIAGNOSIS — G8929 Other chronic pain: Secondary | ICD-10-CM | POA: Diagnosis not present

## 2024-06-17 DIAGNOSIS — R7989 Other specified abnormal findings of blood chemistry: Secondary | ICD-10-CM | POA: Diagnosis not present

## 2024-06-17 DIAGNOSIS — I10 Essential (primary) hypertension: Secondary | ICD-10-CM | POA: Diagnosis not present

## 2024-06-17 DIAGNOSIS — R52 Pain, unspecified: Secondary | ICD-10-CM | POA: Diagnosis not present

## 2024-06-17 DIAGNOSIS — Z299 Encounter for prophylactic measures, unspecified: Secondary | ICD-10-CM | POA: Diagnosis not present

## 2024-06-17 DIAGNOSIS — G959 Disease of spinal cord, unspecified: Secondary | ICD-10-CM | POA: Diagnosis not present

## 2024-07-08 ENCOUNTER — Encounter (INDEPENDENT_AMBULATORY_CARE_PROVIDER_SITE_OTHER): Payer: Self-pay | Admitting: Gastroenterology

## 2024-07-29 DIAGNOSIS — C44311 Basal cell carcinoma of skin of nose: Secondary | ICD-10-CM | POA: Diagnosis not present

## 2024-07-29 DIAGNOSIS — L814 Other melanin hyperpigmentation: Secondary | ICD-10-CM | POA: Diagnosis not present

## 2024-07-29 DIAGNOSIS — D485 Neoplasm of uncertain behavior of skin: Secondary | ICD-10-CM | POA: Diagnosis not present

## 2024-07-29 DIAGNOSIS — L570 Actinic keratosis: Secondary | ICD-10-CM | POA: Diagnosis not present

## 2024-07-29 DIAGNOSIS — D1801 Hemangioma of skin and subcutaneous tissue: Secondary | ICD-10-CM | POA: Diagnosis not present

## 2024-08-24 DIAGNOSIS — K5190 Ulcerative colitis, unspecified, without complications: Secondary | ICD-10-CM | POA: Diagnosis not present

## 2024-08-24 DIAGNOSIS — R52 Pain, unspecified: Secondary | ICD-10-CM | POA: Diagnosis not present

## 2024-08-24 DIAGNOSIS — F319 Bipolar disorder, unspecified: Secondary | ICD-10-CM | POA: Diagnosis not present

## 2024-08-24 DIAGNOSIS — I10 Essential (primary) hypertension: Secondary | ICD-10-CM | POA: Diagnosis not present

## 2024-08-24 DIAGNOSIS — Z299 Encounter for prophylactic measures, unspecified: Secondary | ICD-10-CM | POA: Diagnosis not present

## 2024-08-24 DIAGNOSIS — M549 Dorsalgia, unspecified: Secondary | ICD-10-CM | POA: Diagnosis not present

## 2024-08-24 DIAGNOSIS — G8929 Other chronic pain: Secondary | ICD-10-CM | POA: Diagnosis not present

## 2024-08-27 DIAGNOSIS — C44311 Basal cell carcinoma of skin of nose: Secondary | ICD-10-CM | POA: Diagnosis not present
